# Patient Record
Sex: Male | Born: 1950 | Race: White | Hispanic: No | Marital: Single | Smoking: Never smoker
Health system: Southern US, Community
[De-identification: ages and names within clinical notes are randomized; demographics above are authoritative.]

## PROBLEM LIST (undated history)

## (undated) DIAGNOSIS — R569 Unspecified convulsions: Secondary | ICD-10-CM

## (undated) DIAGNOSIS — F79 Unspecified intellectual disabilities: Secondary | ICD-10-CM

## (undated) HISTORY — PX: COLON SURGERY: SHX602

---

## 2005-02-02 ENCOUNTER — Ambulatory Visit: Payer: Self-pay | Admitting: Internal Medicine

## 2011-02-08 ENCOUNTER — Ambulatory Visit: Payer: Self-pay | Admitting: Internal Medicine

## 2014-06-20 ENCOUNTER — Emergency Department: Payer: Self-pay | Admitting: Emergency Medicine

## 2014-10-08 ENCOUNTER — Ambulatory Visit: Payer: Self-pay | Admitting: Neurology

## 2017-01-11 ENCOUNTER — Emergency Department
Admission: EM | Admit: 2017-01-11 | Discharge: 2017-01-11 | Disposition: A | Discharge status: Home / Self Care | Payer: Medicare Other | Source: Home / Self Care | Attending: Emergency Medicine | Admitting: Emergency Medicine

## 2017-01-11 ENCOUNTER — Encounter: Payer: Self-pay | Admitting: Emergency Medicine

## 2017-01-11 DIAGNOSIS — N3000 Acute cystitis without hematuria: Secondary | ICD-10-CM | POA: Insufficient documentation

## 2017-01-11 DIAGNOSIS — N390 Urinary tract infection, site not specified: Secondary | ICD-10-CM | POA: Diagnosis not present

## 2017-01-11 LAB — CBC WITH DIFFERENTIAL/PLATELET
Basophils Absolute: 0 10*3/uL (ref 0–0.1)
Basophils Relative: 0 %
Eosinophils Absolute: 0 10*3/uL (ref 0–0.7)
Eosinophils Relative: 0 %
HEMATOCRIT: 38.4 % — AB (ref 40.0–52.0)
Hemoglobin: 13.1 g/dL (ref 13.0–18.0)
LYMPHS ABS: 0.6 10*3/uL — AB (ref 1.0–3.6)
LYMPHS PCT: 9 %
MCH: 30 pg (ref 26.0–34.0)
MCHC: 34.2 g/dL (ref 32.0–36.0)
MCV: 87.6 fL (ref 80.0–100.0)
MONO ABS: 0.9 10*3/uL (ref 0.2–1.0)
MONOS PCT: 13 %
NEUTROS ABS: 5.4 10*3/uL (ref 1.4–6.5)
Neutrophils Relative %: 78 %
Platelets: 145 10*3/uL — ABNORMAL LOW (ref 150–440)
RBC: 4.38 MIL/uL — ABNORMAL LOW (ref 4.40–5.90)
RDW: 13 % (ref 11.5–14.5)
WBC: 6.9 10*3/uL (ref 3.8–10.6)

## 2017-01-11 LAB — URINALYSIS, COMPLETE (UACMP) WITH MICROSCOPIC
Bilirubin Urine: NEGATIVE
GLUCOSE, UA: NEGATIVE mg/dL
Hgb urine dipstick: NEGATIVE
KETONES UR: 20 mg/dL — AB
NITRITE: NEGATIVE
PH: 5 (ref 5.0–8.0)
Protein, ur: 100 mg/dL — AB
SPECIFIC GRAVITY, URINE: 1.018 (ref 1.005–1.030)

## 2017-01-11 LAB — BASIC METABOLIC PANEL
Anion gap: 11 (ref 5–15)
BUN: 13 mg/dL (ref 6–20)
CALCIUM: 9 mg/dL (ref 8.9–10.3)
CHLORIDE: 97 mmol/L — AB (ref 101–111)
CO2: 27 mmol/L (ref 22–32)
CREATININE: 0.95 mg/dL (ref 0.61–1.24)
GFR calc Af Amer: >60 mL/min (ref >60)
GFR calc non Af Amer: >60 mL/min (ref >60)
GLUCOSE: 115 mg/dL — AB (ref 65–99)
Potassium: 3.6 mmol/L (ref 3.5–5.1)
Sodium: 135 mmol/L (ref 135–145)

## 2017-01-11 LAB — LACTIC ACID, PLASMA: Lactic Acid, Venous: 1.1 mmol/L (ref 0.5–1.9)

## 2017-01-11 MED ORDER — CEFTRIAXONE SODIUM-DEXTROSE 1-3.74 GM-% IV SOLR
1.0000 g | Freq: Once | INTRAVENOUS | Status: AC
  Administered 2017-01-11: 1 g via INTRAVENOUS
  Filled 2017-01-11: qty 50

## 2017-01-11 MED ORDER — CIPROFLOXACIN HCL 250 MG PO TABS: 250.0000 mg | ORAL_TABLET | Freq: Two times a day (BID) | ORAL | 0 refills | Status: DC

## 2017-01-11 MED ORDER — DEXTROSE 5 % IV SOLN: 1.0000 g | Freq: Once | INTRAVENOUS | Status: DC

## 2017-01-11 NOTE — ED Notes (Signed)
Patient care giver reports that patient has had fever and has had increased urinary frequency.

## 2017-01-11 NOTE — ED Notes (Signed)
Pt's caregiver verbalizes understanding of discharge instructions.

## 2017-01-11 NOTE — ED Triage Notes (Addendum)
Caregiver noticed odor to urine a  couple of days ago and today seems some altered. Pt with hx of UTI's.  Pt unable to communicate adequately.

## 2017-01-11 NOTE — ED Notes (Signed)
Pt unable to urinate at this time.  

## 2017-01-11 NOTE — ED Provider Notes (Signed)
[facility] Emergency Department Provider Note    L5 caveat: Review of systems and history is limited by mental retardation    Time seen: ----------------------------------------- 4:28 PM on 01/11/2017 -----------------------------------------    I have reviewed the triage vital signs and the nursing notes.   HISTORY  Chief Complaint Urinary Frequency    HPI Richard Gillespie is a 66 y.o. male who presents to the ER for possible altered mental status. He has a history of UTIs and his caregiver states his urine has a foul smell he's been somewhat off balance. Patient is difficult history communicating at baseline. She states he may have had a fever as well. No further information is available.   No past medical history on file.  There are no active problems to display for this patient.   No past surgical history on file.  Allergies Patient has no known allergies.  Social History Social History  Substance Use Topics  . Smoking status: Never Smoker  . Smokeless tobacco: Never Used  . Alcohol use No    Review of Systems Constitutional: Positive for fever ____________________________________________   PHYSICAL EXAM:  VITAL SIGNS: ED Triage Vitals [01/11/17 1406]  Enc Vitals Group     BP (!) 118/95     Pulse Rate 82     Resp 20     Temp 100 F (37.8 C)     Temp Source Oral     SpO2 93 %     Weight 150 lb (68 kg)     Height 5\' 5"  (1.651 m)     Head Circumference      Peak Flow      Pain Score      Pain Loc      Pain Edu?      Excl. in GC?     Constitutional: Alert. Well appearing and in no distress. Eyes: Conjunctivae are normal. Normal extraocular movements. ENT   Head: Normocephalic and atraumatic.   Nose: No congestion/rhinnorhea.   Mouth/Throat: Mucous membranes are moist.   Neck: No stridor. Cardiovascular: Normal rate, regular rhythm. No murmurs, rubs, or gallops. Respiratory: Normal respiratory effort without  tachypnea nor retractions. Breath sounds are clear and equal bilaterally. No wheezes/rales/rhonchi. Gastrointestinal: Soft and nontender. Normal bowel sounds Musculoskeletal: Nontender with normal range of motion in all extremities. No lower extremity tenderness nor edema. Neurologic:  No gross focal neurologic deficits are appreciated. Patient does require assistance with ambulation Skin:  Skin is warm, dry and intact. No rash noted. ____________________________________________  ED COURSE:  Pertinent labs & imaging results that were available during my care of the patient were reviewed by me and considered in my medical decision making (see chart for details). Patient presents to the ER in no acute distress. We will assess with labs and possible imaging.   Procedures ____________________________________________   LABS (pertinent positives/negatives)  Labs Reviewed  URINALYSIS, COMPLETE (UACMP) WITH MICROSCOPIC - Abnormal; Notable for the following:       Result Value   Color, Urine AMBER (*)    APPearance HAZY (*)    Ketones, ur 20 (*)    Protein, ur 100 (*)    Leukocytes, UA SMALL (*)    Bacteria, UA MANY (*)    Squamous Epithelial / LPF 0-5 (*)    All other components within normal limits  CBC WITH DIFFERENTIAL/PLATELET - Abnormal; Notable for the following:    RBC 4.38 (*)    HCT 38.4 (*)    Platelets 145 (*)  Lymphs Abs 0.6 (*)    All other components within normal limits  BASIC METABOLIC PANEL - Abnormal; Notable for the following:    Chloride 97 (*)    Glucose, Bld 115 (*)    All other components within normal limits  URINE CULTURE  LACTIC ACID, PLASMA  ____________________________________________  FINAL ASSESSMENT AND PLAN  Weakness, UTI  Plan: Patient with labs as dictated above. Patient does have a developing urinary tract infection. He was started on IV Rocephin. He'll be discharged with Cipro and a urine culture will be followed. He is stable for  outpatient follow-up.   [name], MD   Note: This note was generated in part or whole with voice recognition software. Voice recognition is usually quite accurate but there are transcription errors that can and very often do occur. I apologize for any typographical errors that were not detected and corrected.     [name], MD 01/11/17 [phone]

## 2017-01-12 ENCOUNTER — Emergency Department: Payer: Medicare Other

## 2017-01-12 ENCOUNTER — Inpatient Hospital Stay
Admission: EM | Admit: 2017-01-12 | Discharge: 2017-01-15 | DRG: 690 | Disposition: A | Discharge status: Skilled Nursing Facility | Payer: Medicare Other | Attending: Internal Medicine | Admitting: Internal Medicine

## 2017-01-12 ENCOUNTER — Encounter: Payer: Self-pay | Admitting: Emergency Medicine

## 2017-01-12 DIAGNOSIS — E876 Hypokalemia: Secondary | ICD-10-CM | POA: Diagnosis present

## 2017-01-12 DIAGNOSIS — I10 Essential (primary) hypertension: Secondary | ICD-10-CM | POA: Diagnosis present

## 2017-01-12 DIAGNOSIS — N390 Urinary tract infection, site not specified: Principal | ICD-10-CM | POA: Diagnosis present

## 2017-01-12 DIAGNOSIS — R739 Hyperglycemia, unspecified: Secondary | ICD-10-CM | POA: Diagnosis present

## 2017-01-12 DIAGNOSIS — G40909 Epilepsy, unspecified, not intractable, without status epilepticus: Secondary | ICD-10-CM | POA: Diagnosis present

## 2017-01-12 DIAGNOSIS — R05 Cough: Secondary | ICD-10-CM

## 2017-01-12 DIAGNOSIS — R059 Cough, unspecified: Secondary | ICD-10-CM

## 2017-01-12 DIAGNOSIS — B9620 Unspecified Escherichia coli [E. coli] as the cause of diseases classified elsewhere: Secondary | ICD-10-CM | POA: Diagnosis present

## 2017-01-12 DIAGNOSIS — A498 Other bacterial infections of unspecified site: Secondary | ICD-10-CM

## 2017-01-12 DIAGNOSIS — R32 Unspecified urinary incontinence: Secondary | ICD-10-CM | POA: Diagnosis present

## 2017-01-12 DIAGNOSIS — Z79899 Other long term (current) drug therapy: Secondary | ICD-10-CM

## 2017-01-12 DIAGNOSIS — E860 Dehydration: Secondary | ICD-10-CM | POA: Diagnosis present

## 2017-01-12 DIAGNOSIS — R197 Diarrhea, unspecified: Secondary | ICD-10-CM | POA: Diagnosis present

## 2017-01-12 DIAGNOSIS — F79 Unspecified intellectual disabilities: Secondary | ICD-10-CM | POA: Diagnosis present

## 2017-01-12 DIAGNOSIS — R296 Repeated falls: Secondary | ICD-10-CM | POA: Diagnosis present

## 2017-01-12 DIAGNOSIS — E871 Hypo-osmolality and hyponatremia: Secondary | ICD-10-CM | POA: Diagnosis present

## 2017-01-12 DIAGNOSIS — R451 Restlessness and agitation: Secondary | ICD-10-CM

## 2017-01-12 DIAGNOSIS — N12 Tubulo-interstitial nephritis, not specified as acute or chronic: Secondary | ICD-10-CM

## 2017-01-12 DIAGNOSIS — D72829 Elevated white blood cell count, unspecified: Secondary | ICD-10-CM

## 2017-01-12 HISTORY — DX: Unspecified convulsions: R56.9

## 2017-01-12 HISTORY — DX: Unspecified intellectual disabilities: F79

## 2017-01-12 LAB — CBC WITH DIFFERENTIAL/PLATELET
BASOS PCT: 0 %
Basophils Absolute: 0 10*3/uL (ref 0–0.1)
EOS ABS: 0 10*3/uL (ref 0–0.7)
EOS PCT: 0 %
HCT: 39.4 % — ABNORMAL LOW (ref 40.0–52.0)
Hemoglobin: 14 g/dL (ref 13.0–18.0)
LYMPHS ABS: 1 10*3/uL (ref 1.0–3.6)
Lymphocytes Relative: 9 %
MCH: 30.3 pg (ref 26.0–34.0)
MCHC: 35.6 g/dL (ref 32.0–36.0)
MCV: 85.2 fL (ref 80.0–100.0)
Monocytes Absolute: 1.1 10*3/uL — ABNORMAL HIGH (ref 0.2–1.0)
Monocytes Relative: 10 %
Neutro Abs: 9.5 10*3/uL — ABNORMAL HIGH (ref 1.4–6.5)
Neutrophils Relative %: 81 %
PLATELETS: 139 10*3/uL — AB (ref 150–440)
RBC: 4.63 MIL/uL (ref 4.40–5.90)
RDW: 13.1 % (ref 11.5–14.5)
WBC: 11.7 10*3/uL — AB (ref 3.8–10.6)

## 2017-01-12 LAB — COMPREHENSIVE METABOLIC PANEL
ALBUMIN: 4.4 g/dL (ref 3.5–5.0)
ALT: 29 U/L (ref 17–63)
AST: 48 U/L — ABNORMAL HIGH (ref 15–41)
Alkaline Phosphatase: 49 U/L (ref 38–126)
Anion gap: 12 (ref 5–15)
BUN: 23 mg/dL — AB (ref 6–20)
CALCIUM: 8.4 mg/dL — AB (ref 8.9–10.3)
CO2: 21 mmol/L — ABNORMAL LOW (ref 22–32)
CREATININE: 1.23 mg/dL (ref 0.61–1.24)
Chloride: 100 mmol/L — ABNORMAL LOW (ref 101–111)
GFR calc Af Amer: >60 mL/min (ref >60)
GFR calc non Af Amer: 60 mL/min — ABNORMAL LOW (ref >60)
GLUCOSE: 127 mg/dL — AB (ref 65–99)
POTASSIUM: 3.1 mmol/L — AB (ref 3.5–5.1)
SODIUM: 133 mmol/L — AB (ref 135–145)
TOTAL PROTEIN: 7.8 g/dL (ref 6.5–8.1)
Total Bilirubin: 0.8 mg/dL (ref 0.3–1.2)

## 2017-01-12 LAB — LACTIC ACID, PLASMA: LACTIC ACID, VENOUS: 0.8 mmol/L (ref 0.5–1.9)

## 2017-01-12 LAB — MRSA PCR SCREENING: MRSA BY PCR: NEGATIVE

## 2017-01-12 MED ORDER — ONDANSETRON HCL 4 MG PO TABS: 4.0000 mg | ORAL_TABLET | Freq: Four times a day (QID) | ORAL | Status: DC | PRN

## 2017-01-12 MED ORDER — CARBAMAZEPINE ER 100 MG PO TB12
100.0000 mg | ORAL_TABLET | Freq: Every day | ORAL | Status: DC
  Administered 2017-01-14: 100 mg via ORAL
  Filled 2017-01-12 (×3): qty 1

## 2017-01-12 MED ORDER — ACETAMINOPHEN 650 MG RE SUPP: 650.0000 mg | Freq: Four times a day (QID) | RECTAL | Status: DC | PRN

## 2017-01-12 MED ORDER — TRAZODONE HCL 100 MG PO TABS
150.0000 mg | ORAL_TABLET | Freq: Every evening | ORAL | Status: DC | PRN
  Administered 2017-01-13: 21:00:00 150 mg via ORAL
  Filled 2017-01-12: qty 1

## 2017-01-12 MED ORDER — CEFTRIAXONE SODIUM-DEXTROSE 1-3.74 GM-% IV SOLR
1.0000 g | Freq: Once | INTRAVENOUS | Status: AC
  Administered 2017-01-12: 1 g via INTRAVENOUS
  Filled 2017-01-12: qty 50

## 2017-01-12 MED ORDER — ONDANSETRON HCL 4 MG/2ML IJ SOLN: 4.0000 mg | Freq: Four times a day (QID) | INTRAMUSCULAR | Status: DC | PRN

## 2017-01-12 MED ORDER — CARBAMAZEPINE ER 200 MG PO TB12
200.0000 mg | ORAL_TABLET | Freq: Four times a day (QID) | ORAL | Status: DC
  Administered 2017-01-12 – 2017-01-15 (×9): 200 mg via ORAL
  Filled 2017-01-12 (×9): qty 1

## 2017-01-12 MED ORDER — CARBAMAZEPINE ER 100 MG PO TB12: 100.0000 mg | ORAL_TABLET | Freq: Four times a day (QID) | ORAL | Status: DC

## 2017-01-12 MED ORDER — LAMOTRIGINE 25 MG PO TABS
50.0000 mg | ORAL_TABLET | Freq: Two times a day (BID) | ORAL | Status: DC
  Administered 2017-01-13 – 2017-01-15 (×5): 50 mg via ORAL
  Filled 2017-01-12 (×5): qty 2

## 2017-01-12 MED ORDER — ACETAMINOPHEN 325 MG PO TABS: 650.0000 mg | ORAL_TABLET | Freq: Four times a day (QID) | ORAL | Status: DC | PRN

## 2017-01-12 MED ORDER — DEXTROSE 5 % IV SOLN
1.0000 g | INTRAVENOUS | Status: DC
  Administered 2017-01-13: 1 g via INTRAVENOUS
  Filled 2017-01-12 (×2): qty 10

## 2017-01-12 MED ORDER — TRAZODONE HCL 50 MG PO TABS: 25.0000 mg | ORAL_TABLET | Freq: Every evening | ORAL | Status: DC | PRN

## 2017-01-12 MED ORDER — SODIUM CHLORIDE 0.9 % IV SOLN
INTRAVENOUS | Status: DC
  Administered 2017-01-12: 19:00:00 via INTRAVENOUS

## 2017-01-12 MED ORDER — SODIUM CHLORIDE 0.9 % IV BOLUS (SEPSIS)
500.0000 mL | Freq: Once | INTRAVENOUS | Status: AC
  Administered 2017-01-12: 500 mL via INTRAVENOUS

## 2017-01-12 MED ORDER — HEPARIN SODIUM (PORCINE) 5000 UNIT/ML IJ SOLN
5000.0000 [IU] | Freq: Three times a day (TID) | INTRAMUSCULAR | Status: DC
  Administered 2017-01-12 – 2017-01-15 (×6): 5000 [IU] via SUBCUTANEOUS
  Filled 2017-01-12 (×7): qty 1

## 2017-01-12 MED ORDER — DEXTROSE 5 % IV SOLN: 1.0000 g | Freq: Once | INTRAVENOUS | Status: DC

## 2017-01-12 MED ORDER — CARBAMAZEPINE 200 MG PO TABS
ORAL_TABLET | ORAL | Status: AC
  Filled 2017-01-12: qty 1

## 2017-01-12 MED ORDER — SODIUM CHLORIDE 0.9 % IV SOLN
30.0000 meq | Freq: Once | INTRAVENOUS | Status: AC
  Administered 2017-01-12: 30 meq via INTRAVENOUS
  Filled 2017-01-12: qty 15

## 2017-01-12 NOTE — ED Triage Notes (Signed)
Caregivers state patient has fallen 3 times in past 24 hours. States was [facility] for [facility] and started on antibiotics.

## 2017-01-12 NOTE — ED Provider Notes (Signed)
[facility] Emergency Department Provider Note  ____________________________________________  Time seen: Approximately 3:37 PM  I have reviewed the triage vital signs and the nursing notes.   HISTORY  Chief Complaint Fall and Urinary Tract Infection  Level 5 caveat:  Portions of the history and physical were unable to be obtained due to mental retardation   HPI Richard Gillespie is a 66 y.o. male with h/o MR and seizure who presents for evaluation of generalized weakness and multiple falls. Patient was seen here yesterday and diagnosed with a urinary tract infection. He received a dose of ceftriaxone and was sent home on Cipro which she has been taking. He lives in a group home and his caretakers are here with him. The family patient has been extremely weak and has had multiple falls since leaving the hospital. He is now incontinent of urine and stool which is new for him. Has had diarrhea 3 or 4 episodes since leaving the hospital as well. Has had a low-grade fever of 99 at home. Continues to have poor appetite. As far as they know patient did not sustain any injuries from his falls.  Past Medical History:  Diagnosis Date  . Mental retardation   . Seizures (HCC)     There are no active problems to display for this patient.   Past Surgical History:  Procedure Laterality Date  . COLON SURGERY      Prior to Admission medications   Medication Sig Start Date End Date Taking? Authorizing Provider  ciprofloxacin (CIPRO) 250 MG tablet Take 1 tablet (250 mg total) by mouth 2 (two) times daily. 01/11/17 01/21/17  [name], MD    Allergies Zithromax [azithromycin]  No family history on file.  Social History Social History  Substance Use Topics  . Smoking status: Never Smoker  . Smokeless tobacco: Never Used  . Alcohol use No    Review of Systems  Constitutional: Negative for fever. + generalized weakness and multiple falls Eyes: Negative  for visual changes. ENT: Negative for sore throat. Neck: No neck pain  Cardiovascular: Negative for chest pain. Respiratory: Negative for shortness of breath. Gastrointestinal: Negative for abdominal pain, vomiting. + diarrhea. Genitourinary: Negative for dysuria. + incontinence Musculoskeletal: Negative for back pain. Skin: Negative for rash. Neurological: Negative for headaches, weakness or numbness. Psych: No SI or HI  ____________________________________________   PHYSICAL EXAM:  VITAL SIGNS: ED Triage Vitals  Enc Vitals Group     BP 01/12/17 1420 115/76     Pulse Rate 01/12/17 1420 83     Resp 01/12/17 1420 20     Temp 01/12/17 1420 99.6 F (37.6 C)     Temp Source 01/12/17 1420 Oral     SpO2 01/12/17 1420 96 %     Weight 01/12/17 1421 150 lb (68 kg)     Height 01/12/17 1421 5\' 5"  (1.651 m)     Head Circumference --      Peak Flow --      Pain Score --      Pain Loc --      Pain Edu? --      Excl. in GC? --     Constitutional: Awake, answers no to all questions. No distress. HEENT Head: Normocephalic and atraumatic. Face: No facial bony tenderness. Stable midface Ears: No hemotympanum bilaterally. No Battle sign Eyes: No eye injury. PERRL. No raccoon eyes Nose: Nontender. No epistaxis. No rhinorrhea Mouth/Throat: Mucous membranes are moist. No oropharyngeal blood. No dental injury.  Airway patent without stridor. Normal voice. Neck: no C-collar in place. No midline c-spine tenderness.  Cardiovascular: Normal rate, regular rhythm. Normal and symmetric distal pulses are present in all extremities. Pulmonary/Chest: Chest wall is stable and nontender to palpation/compression. Normal respiratory effort. Breath sounds are normal. No crepitus.  Abdominal: Soft, nontender, non distended. Musculoskeletal: Nontender with normal full range of motion in all extremities. No deformities. No thoracic or lumbar midline spinal tenderness. Pelvis is stable. Skin: Abrasions to the  upper back and left shoulder Neurological: Face is symmetric. Moves all extremities to command. No gross focal neurologic deficits are appreciated.  Glascow Coma Score: 4 - Opens eyes on own 6 - Follows simple motor commands 4 - Seems confused, disoriented GCS: 14  ____________________________________________   LABS (all labs ordered are listed, but only abnormal results are displayed)  Labs Reviewed  CBC WITH DIFFERENTIAL/PLATELET - Abnormal; Notable for the following:       Result Value   WBC 11.7 (*)    HCT 39.4 (*)    Platelets 139 (*)    Neutro Abs 9.5 (*)    Monocytes Absolute 1.1 (*)    All other components within normal limits  COMPREHENSIVE METABOLIC PANEL - Abnormal; Notable for the following:    Sodium 133 (*)    Potassium 3.1 (*)    Chloride 100 (*)    CO2 21 (*)    Glucose, Bld 127 (*)    BUN 23 (*)    Calcium 8.4 (*)    AST 48 (*)    GFR calc non Af Amer 60 (*)    All other components within normal limits  URINALYSIS, ROUTINE W REFLEX MICROSCOPIC  LACTIC ACID, PLASMA   ____________________________________________  EKG  none ____________________________________________  RADIOLOGY  Reviewed by me with no acute findings ____________________________________________   PROCEDURES  Procedure(s) performed: None Procedures Critical Care performed:  None ____________________________________________   INITIAL IMPRESSION / ASSESSMENT AND PLAN / ED COURSE  66 y.o. male with h/o MR and seizure who presents for evaluation of generalized weakness, multiple falls, urinary incontinence, and diarrhea since being diagnosed with UTI yesterday. PE showing abrasions to upper back and L shoulder. No other findings. Labs showing worsening leukocytosis 6.9 -> 11.7 since yesterday. Vitals showing temp 99.77F, otherwise normal. XR of the L shoulder, head CT and CT c-spine negative. Urine culture from yesterday showing > 100K colonies of E. Coli. Susceptibility pending.  Since patient now has low grade temp, worsening leukocytosis, and generalized weakness will admit for IV abx.   Clinical Course as of Jan 12 1645  Sat Jan 12, 2017  1645 Patient's temp 96F, worsening leukocytosis, incontinent and worsening clinical picture concerning for failed outpatient therapy. Will give ceftriaxone and admit to Hospitalist for pyelonephritis  [CV]    Clinical Course User Index [CV] Nita Sicklearolina Syna Gad, MD    Pertinent labs & imaging results that were available during my care of the patient were reviewed by me and considered in my medical decision making (see chart for details).    ____________________________________________   FINAL CLINICAL IMPRESSION(S) / ED DIAGNOSES  Final diagnoses:  Pyelonephritis      NEW MEDICATIONS STARTED DURING THIS VISIT:  New Prescriptions   No medications on file     Note:  This document was prepared using Dragon voice recognition software and may include unintentional dictation errors.    Nita Sicklearolina Lorilei Horan, MD 01/12/17 [phone]

## 2017-01-12 NOTE — ED Notes (Signed)
Bed placement called and stated that the floor nurses wanted the patient placed close to the nurses station. The available rooms are being cleaned at this time.

## 2017-01-12 NOTE — ED Notes (Signed)
Pharmacy called for admission meds.

## 2017-01-12 NOTE — ED Notes (Signed)
FIRST NURSE NOTE: Pt lives in a group home, increased falls and has been incontinent. Group home representatives with patient at this time.

## 2017-01-12 NOTE — ED Notes (Signed)
Patient has gone to CT scan.

## 2017-01-12 NOTE — H&P (Signed)
[facility] [place] [place] [facility]   PATIENT NAME: Richard Gillespie    MR#:  [id number]  DATE OF BIRTH:  Jun 29, 1951  DATE OF ADMISSION:  01/12/2017  PRIMARY CARE PHYSICIAN: Rafael Bihari, MD REQUESTING/REFERRING PHYSICIAN:   CHIEF COMPLAINT:   Chief Complaint  Patient presents with  . Fall  . Urinary Tract Infection    HISTORY OF PRESENT ILLNESS:  Richard Gillespie  is a 66 y.o. male with a known history of mental retardation,seizure disorder comes from [facility] group home because of multiple episodes of falls today, incontinence, diarrhea. Patient was in the emergency room yesterday and was treated for UTI with Cipro  discharged backto group home. But the staff noticed that he had fallen  multiple times today, confused, has diarrhea, incontinence, low-grade temp of 99 Fahrenheit.  Urine Cultures from 2/16  showed Escherichia coli.  PAST MEDICAL HISTORY:   Past Medical History:  Diagnosis Date  . Mental retardation   . Seizures (HCC)     PAST SURGICAL HISTOIRY:   Past Surgical History:  Procedure Laterality Date  . COLON SURGERY      SOCIAL HISTORY:   Social History  Substance Use Topics  . Smoking status: Never Smoker  . Smokeless tobacco: Never Used  . Alcohol use No    FAMILY HISTORY:  No family history on file.  DRUG ALLERGIES:   Allergies  Allergen Reactions  . Zithromax [Azithromycin] Other (See Comments)    unknown    REVIEW OF SYSTEMS:  Not obtainable because of his mental retardation. MEDICATIONS AT HOME:   Prior to Admission medications   Medication Sig Start Date End Date Taking? Authorizing Provider  ciprofloxacin (CIPRO) 250 MG tablet Take 1 tablet (250 mg total) by mouth 2 (two) times daily. 01/11/17 01/21/17  [name], MD      VITAL SIGNS:  Blood pressure 109/60, pulse 69, temperature 99.6 F (37.6 C), temperature source Oral, resp. rate 18, height 5\' 5"  (1.651 m), weight 68 kg (150 lb), SpO2 92 %.  PHYSICAL  EXAMINATION:  GENERAL:  66 y.o.-year-old patient lying in the bed with no acute distress.  EYES: Pupils equal, round, reactive to light and accommodation. No scleral icterus. Extraocular muscles intact.  HEENT: Head atraumatic, normocephalic. Oropharynx and nasopharynx clear.  NECK:  Supple, no jugular venous distention. No thyroid enlargement, no tenderness.  LUNGS: Normal breath sounds bilaterally, no wheezing, rales,rhonchi or crepitation. No use of accessory muscles of respiration.  CARDIOVASCULAR: S1, S2 normal. No murmurs, rubs, or gallops.  ABDOMEN: Soft, nontender, nondistended. Bowel sounds present. No organomegaly or mass.  EXTREMITIES: No pedal edema, cyanosis, or clubbing.  NEUROLOGIC:Unable to do neurological exam because of mental retardation  PSYCHIATRIC: The patient is alert and oriented x 3. He knows  is in the hospital. SKIN: No obvious rash, lesion, or ulcer.   LABORATORY PANEL:   CBC  Recent Labs Lab 01/12/17 1436  WBC 11.7*  HGB 14.0  HCT 39.4*  PLT 139*   ------------------------------------------------------------------------------------------------------------------  Chemistries   Recent Labs Lab 01/12/17 1436  NA 133*  K 3.1*  CL 100*  CO2 21*  GLUCOSE 127*  BUN 23*  CREATININE 1.23  CALCIUM 8.4*  AST 48*  ALT 29  ALKPHOS 49  BILITOT 0.8   ------------------------------------------------------------------------------------------------------------------  Cardiac Enzymes No results for input(s): TROPONINI in the last 168 hours. ------------------------------------------------------------------------------------------------------------------  RADIOLOGY:  Ct Head Wo Contrast  Result Date: 01/12/2017 CLINICAL DATA:  Larey Seat today, unable to stand unknown, history  of seizures, mental retardation EXAM: CT HEAD WITHOUT CONTRAST CT CERVICAL SPINE WITHOUT CONTRAST TECHNIQUE: Multidetector CT imaging of the head and cervical spine was performed  following the standard protocol without intravenous contrast. Multiplanar CT image reconstructions of the cervical spine were also generated. COMPARISON:  10/08/2014 FINDINGS: CT HEAD FINDINGS Brain: Nonstandard positioning due to patient condition. Scattered motion artifacts. Generalized atrophy. Stable ventricular morphology. No midline shift. Low-attenuation lesion with partially calcified rim at the medial aspect of the RIGHT middle cranial fossa, 4.4 x 2.3 cm series 14, image 52, extending into posterior fossa, demonstrating slightly more calcification than on the previous exam. No additional intracranial mass lesion. No evidence of acute infarction or intracranial hemorrhage. No extra-axial fluid collections. Vascular: Unremarkable Skull: Intact Sinuses/Orbits: Clear Other: N/A CT CERVICAL SPINE FINDINGS Alignment: Normal Skull base and vertebrae: Visualized skullbase intact. Incomplete posterior arch C1, developmental variant. Vertebral body heights maintained without fracture or bone destruction. Soft tissues and spinal canal: Prevertebral soft tissues normal thickness. Disc levels:  Disc space heights fairly well maintained Upper chest: Visualized lung apices grossly clear Other: N/A IMPRESSION: No acute cervical spine abnormalities. Generalized atrophy. 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 No new intracranial abnormalities. Electronically Signed   By: Ulyses SouthwardMark  Boles M.D.   On: 01/12/2017 16:22   Ct Cervical Spine Wo Contrast  Result Date: 01/12/2017 CLINICAL DATA:  Larey SeatFell today, unable to stand unknown, history of seizures, mental retardation EXAM: CT HEAD WITHOUT CONTRAST CT CERVICAL SPINE WITHOUT CONTRAST TECHNIQUE: Multidetector CT imaging  of the head and cervical spine was performed following the standard protocol without intravenous contrast. Multiplanar CT image reconstructions of the cervical spine were also generated. COMPARISON:  10/08/2014 FINDINGS: CT HEAD FINDINGS Brain: Nonstandard positioning due to patient condition. Scattered motion artifacts. Generalized atrophy. Stable ventricular morphology. No midline shift. Low-attenuation lesion with partially calcified rim at the medial aspect of the RIGHT middle cranial fossa, 4.4 x 2.3 cm series 14, image 52, extending into posterior fossa, demonstrating slightly more calcification than on the previous exam. No additional intracranial mass lesion. No evidence of acute infarction or intracranial hemorrhage. No extra-axial fluid collections. Vascular: Unremarkable Skull: Intact Sinuses/Orbits: Clear Other: N/A CT CERVICAL SPINE FINDINGS Alignment: Normal Skull base and vertebrae: Visualized skullbase intact. Incomplete posterior arch C1, developmental variant. Vertebral body heights maintained without fracture or bone destruction. Soft tissues and spinal canal: Prevertebral soft tissues normal thickness. Disc levels:  Disc space heights fairly well maintained Upper chest: Visualized lung apices grossly clear Other: N/A IMPRESSION: No acute cervical spine abnormalities. Generalized atrophy. 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 No new intracranial abnormalities. Electronically Signed   By: Ulyses SouthwardMark  Boles M.D.   On: 01/12/2017 16:22   Dg Shoulder Left  Result Date: 01/12/2017 CLINICAL DATA:  Pain following fall EXAM: LEFT SHOULDER - 2+ VIEW COMPARISON:  None. FINDINGS: Internal rotation, external rotation, and Y scapular images were  obtained. There is no evident fracture or dislocation. Joint spaces appear unremarkable. No erosive change. Visualized left lung clear. IMPRESSION: No fracture  or dislocation.  No appreciable arthropathy. Electronically Signed   By: Bretta Bang III M.D.   On: 01/12/2017 16:14    EKG:  No orders found for this or any previous visit.  IMPRESSION AND PLAN:  #66 year old male with mental retardation, seizure disorder brought from group home because of multiple episodes of falls, diarrhea, incontinence, low-grade temperature today. Patient failed outpatient therapy for UTI . Seen yesterday in the emergency room, was given Cipro but he continued to have low-grade temperature, the elevated white count today, hypokalemia.  #1 Escherichia coli UTI, failed outpatient therapy with ongoing temperature, leukocytosis: Admitted to hospitalist service, started on IV Rocephin, follow sensitivity results for urine cultures.  #2 seizure disorder: Continue Tegretol, Lamictal. Patient is on Tegretol-XR 200 mg 4 times daily, patient takes an additional 100 mg at night. And patient also  is on Lamictal 50 mg twice a day. Continue them. Last seizure was years ago as per  Group  home staff. 3.Hypokalemia;: Secondary to diarrhea, GI losses: Replace the potassium 4. Hypertension: Patient   bplow normal today hold the clonidine.  #5,. mental retardation.lives at [facility] group home for years.on trazodone at night .    All the records are reviewed and case discussed with ED provider. Management plans discussed with the patient, family and they are in agreement.  CODE STATUS:full  TOTAL TIME TAKING CARE OF THIS PATIENT: 55 minutes.    Katha Hamming M.D on 01/12/2017 at 5:04 PM  Between 7am to 6pm - Pager - [phone]  After 6pm go to [email or web address] - password [email or web address]  [facility]  Office  [phone]  CC: Primary care physician; Rafael Bihari, MD  Note: This  dictation was prepared with Dragon dictation along with smaller phrase technology. Any transcriptional errors that result from this process are unintentional.

## 2017-01-12 NOTE — ED Triage Notes (Signed)
[facility] from [facility] with caregiver.

## 2017-01-12 NOTE — Progress Notes (Signed)
Family Meeting Note  Advance Directive:no  Today a meeting took place with the caregivers from group home. Discussed The code  status with caregivers from group home, patient is a full code caregivers from group home  He  is unable to participate due ZO:[id number]:Lacked capacity mental retardation   The following clinical team members were present during this meeting:MD  The following were discussed:Patient's diagnosis: , Patient's progosis: Unable to determine and Goals for treatment: Full Code  Additional follow-up to be provided: discuss with POA  Time spent during discussion:15 minutes  Katha HammingKONIDENA,Nyanna Heideman, MD

## 2017-01-12 NOTE — Progress Notes (Signed)
Patient is refusing oral meds °

## 2017-01-13 ENCOUNTER — Inpatient Hospital Stay: Payer: Medicare Other

## 2017-01-13 LAB — BASIC METABOLIC PANEL
Anion gap: 9 (ref 5–15)
BUN: 18 mg/dL (ref 6–20)
CO2: 22 mmol/L (ref 22–32)
Calcium: 8.2 mg/dL — ABNORMAL LOW (ref 8.9–10.3)
Chloride: 107 mmol/L (ref 101–111)
Creatinine, Ser: 0.76 mg/dL (ref 0.61–1.24)
GFR calc Af Amer: >60 mL/min (ref >60)
GLUCOSE: 104 mg/dL — AB (ref 65–99)
POTASSIUM: 3.2 mmol/L — AB (ref 3.5–5.1)
Sodium: 138 mmol/L (ref 135–145)

## 2017-01-13 LAB — CBC
HEMATOCRIT: 39.2 % — AB (ref 40.0–52.0)
Hemoglobin: 13.5 g/dL (ref 13.0–18.0)
MCH: 29.7 pg (ref 26.0–34.0)
MCHC: 34.4 g/dL (ref 32.0–36.0)
MCV: 86.2 fL (ref 80.0–100.0)
Platelets: 122 10*3/uL — ABNORMAL LOW (ref 150–440)
RBC: 4.55 MIL/uL (ref 4.40–5.90)
RDW: 13.4 % (ref 11.5–14.5)
WBC: 8.3 10*3/uL (ref 3.8–10.6)

## 2017-01-13 LAB — URINE CULTURE: SPECIAL REQUESTS: NORMAL

## 2017-01-13 LAB — GLUCOSE, CAPILLARY: Glucose-Capillary: 95 mg/dL (ref 65–99)

## 2017-01-13 LAB — MAGNESIUM: MAGNESIUM: 2 mg/dL (ref 1.7–2.4)

## 2017-01-13 MED ORDER — POTASSIUM CHLORIDE IN NACL 20-0.9 MEQ/L-% IV SOLN
INTRAVENOUS | Status: DC
  Filled 2017-01-13 (×5): qty 1000

## 2017-01-13 NOTE — Progress Notes (Signed)
Bladder scan showed 139 mL. Pt resting in bed. No distress/discomfort noted at this time.

## 2017-01-13 NOTE — Plan of Care (Signed)
Problem: Education: Goal: Knowledge of [place] [facility] Education information/materials will improve Outcome: Not Progressing Patient has MR and doesn show signs of retaining education

## 2017-01-13 NOTE — Progress Notes (Signed)
[facility] - [place] at [facility]   PATIENT NAME: Richard Gillespie    MR#:  [id number]  DATE OF BIRTH:  [birth year]  SUBJECTIVE:  CHIEF COMPLAINT:   Chief Complaint  Patient presents with  . Fall  . Urinary Tract Infection   The patient is 66 year old Caucasian male with past medical history significant for history of seizure disorder, who presents to the hospital from the group home because of multiple episodes of fall, incontinence, diarrhea. Apparently, the patient was admitted to the emergency room a few days ago, diagnosed with UTI, discharged on ciprofloxacin back to group home. He was however noted to have confusion, diarrhea, incontinence, low-grade fever and was brought back. His urine cultures are growing Escherichia coli. Patient was initiated on Rocephin intravenously. Continues to have watery diarrhea, is not able to provide any review of systems due to mental retardation  Review of Systems  Unable to perform ROS: Dementia    VITAL SIGNS: Blood pressure (!) 128/59, pulse 67, temperature 97.7 F (36.5 C), temperature source Oral, resp. rate 18, height 5\' 5"  (1.651 m), weight 64.8 kg (142 lb 13.7 oz), SpO2 93 %.  PHYSICAL EXAMINATION:   GENERAL:  66 y.o.-year-old patient lying in the bed in mild distress, restless, uncomfortable, is sitting in the pool of watery stool.  EYES: Pupils equal, round, reactive to light and accommodation. No scleral icterus. Extraocular muscles intact.  HEENT: Head atraumatic, normocephalic. Oropharynx and nasopharynx clear.  NECK:  Supple, no jugular venous distention. No thyroid enlargement, no tenderness.  LUNGS: Normal breath sounds bilaterally, no wheezing, rales,rhonchi or crepitation. No use of accessory muscles of respiration.  CARDIOVASCULAR: S1, S2 normal. No murmurs, rubs, or gallops.  ABDOMEN: Soft, nontender, mild discomfort on palpation, patient pushes examiners away, no rebound or guarding, nondistended. Bowel  sounds present. No organomegaly or mass.  EXTREMITIES: No pedal edema, cyanosis, or clubbing.  NEUROLOGIC: Cranial nerves II through XII are intact. Muscle strength 5/5 in all extremities. Sensation intact. Gait not checked.  PSYCHIATRIC: The patient is alert , not oriented, minimally verbal, able to answer yes and no, however, no meaningful conversation SKIN: No obvious rash, lesion, or ulcer.   ORDERS/RESULTS REVIEWED:   CBC  Recent Labs Lab 01/11/17 1710 01/12/17 1436 01/13/17 0342  WBC 6.9 11.7* 8.3  HGB 13.1 14.0 13.5  HCT 38.4* 39.4* 39.2*  PLT 145* 139* 122*  MCV 87.6 85.2 86.2  MCH 30.0 30.3 29.7  MCHC 34.2 35.6 34.4  RDW 13.0 13.1 13.4  LYMPHSABS 0.6* 1.0  --   MONOABS 0.9 1.1*  --   EOSABS 0.0 0.0  --   BASOSABS 0.0 0.0  --    ------------------------------------------------------------------------------------------------------------------  Chemistries   Recent Labs Lab 01/11/17 1710 01/12/17 1436 01/13/17 0342  NA 135 133* 138  K 3.6 3.1* 3.2*  CL 97* 100* 107  CO2 27 21* 22  GLUCOSE 115* 127* 104*  BUN 13 23* 18  CREATININE 0.95 1.23 0.76  CALCIUM 9.0 8.4* 8.2*  AST  --  48*  --   ALT  --  29  --   ALKPHOS  --  49  --   BILITOT  --  0.8  --    ------------------------------------------------------------------------------------------------------------------ estimated creatinine clearance is 80.1 mL/min (by C-G formula based on SCr of 0.76 mg/dL). ------------------------------------------------------------------------------------------------------------------ No results for input(s): TSH, T4TOTAL, T3FREE, THYROIDAB in the last 72 hours.  Invalid input(s): FREET3  Cardiac Enzymes No results for input(s): CKMB, TROPONINI, MYOGLOBIN in the last 168  hours.  Invalid input(s): CK ------------------------------------------------------------------------------------------------------------------ Invalid input(s):  POCBNP ---------------------------------------------------------------------------------------------------------------  RADIOLOGY: Ct Head Wo Contrast  Result Date: 01/12/2017 CLINICAL DATA:  Larey Seat today, unable to stand unknown, history of seizures, mental retardation EXAM: CT HEAD WITHOUT CONTRAST CT CERVICAL SPINE WITHOUT CONTRAST TECHNIQUE: Multidetector CT imaging of the head and cervical spine was performed following the standard protocol without intravenous contrast. Multiplanar CT image reconstructions of the cervical spine were also generated. COMPARISON:  10/08/2014 FINDINGS: CT HEAD FINDINGS Brain: Nonstandard positioning due to patient condition. Scattered motion artifacts. Generalized atrophy. Stable ventricular morphology. No midline shift. Low-attenuation lesion with partially calcified rim at the medial aspect of the RIGHT middle cranial fossa, 4.4 x 2.3 cm series 14, image 52, extending into posterior fossa, demonstrating slightly more calcification than on the previous exam. No additional intracranial mass lesion. No evidence of acute infarction or intracranial hemorrhage. No extra-axial fluid collections. Vascular: Unremarkable Skull: Intact Sinuses/Orbits: Clear Other: N/A CT CERVICAL SPINE FINDINGS Alignment: Normal Skull base and vertebrae: Visualized skullbase intact. Incomplete posterior arch C1, developmental variant. Vertebral body heights maintained without fracture or bone destruction. Soft tissues and spinal canal: Prevertebral soft tissues normal thickness. Disc levels:  Disc space heights fairly well maintained Upper chest: Visualized lung apices grossly clear Other: N/A IMPRESSION: No acute cervical spine abnormalities. Generalized atrophy. Again identified low-attenuation mass with peripheral calcification at the medial aspect of the RIGHT middle cranial fossa extending into posterior fossa adjacent to the RIGHT lateral aspect of the brainstem, 4.4 x 2.3 cm; based on very low  attenuation, this could represent a dermoid or epidermoid tumor. No new intracranial abnormalities. Electronically Signed   By: Ulyses Southward M.D.   On: 01/12/2017 16:22   Ct Cervical Spine Wo Contrast  Result Date: 01/12/2017 CLINICAL DATA:  Larey Seat today, unable to stand unknown, history of seizures, mental retardation EXAM: CT HEAD WITHOUT CONTRAST CT CERVICAL SPINE WITHOUT CONTRAST TECHNIQUE: Multidetector CT imaging of the head and cervical spine was performed following the standard protocol without intravenous contrast. Multiplanar CT image reconstructions of the cervical spine were also generated. COMPARISON:  10/08/2014 FINDINGS: CT HEAD FINDINGS Brain: Nonstandard positioning due to patient condition. Scattered motion artifacts. Generalized atrophy. Stable ventricular morphology. No midline shift. Low-attenuation lesion with partially calcified rim at the medial aspect of the RIGHT middle cranial fossa, 4.4 x 2.3 cm series 14, image 52, extending into posterior fossa, demonstrating slightly more calcification than on the previous exam. No additional intracranial mass lesion. No evidence of acute infarction or intracranial hemorrhage. No extra-axial fluid collections. Vascular: Unremarkable Skull: Intact Sinuses/Orbits: Clear Other: N/A CT CERVICAL SPINE FINDINGS Alignment: Normal Skull base and vertebrae: Visualized skullbase intact. Incomplete posterior arch C1, developmental variant. Vertebral body heights maintained without fracture or bone destruction. Soft tissues and spinal canal: Prevertebral soft tissues normal thickness. Disc levels:  Disc space heights fairly well maintained Upper chest: Visualized lung apices grossly clear Other: N/A IMPRESSION: No acute cervical spine abnormalities. Generalized atrophy. Again identified low-attenuation mass with peripheral calcification at the medial aspect of the RIGHT middle cranial fossa extending into posterior fossa adjacent to the RIGHT lateral aspect of  the brainstem, 4.4 x 2.3 cm; based on very low attenuation, this could represent a dermoid or epidermoid tumor. No new intracranial abnormalities. Electronically Signed   By: Ulyses Southward M.D.   On: 01/12/2017 16:22   Dg Chest Port 1 View  Result Date: 01/13/2017 CLINICAL DATA:  Cough. EXAM: PORTABLE CHEST 1 VIEW COMPARISON:  None. FINDINGS: Elevated left  diaphragm, possibly related to gas dilated colon and stomach. When accounting for these overlapping interfaces there is no suspected pneumoperitoneum. The heart is shifted to the right and not definitively enlarged. The mediastinum is further distorted by scoliosis. Mild patchy density at the bases. IMPRESSION: 1. Mild nonspecific density at the bases which could be atelectasis, aspiration, or early infection. 2. Gas distended colon and stomach with left diaphragm elevation. Similar findings reported on 06/29/2000 abdominal radiograph. Electronically Signed   By: Marnee Spring M.D.   On: 01/13/2017 10:57   Dg Shoulder Left  Result Date: 01/12/2017 CLINICAL DATA:  Pain following fall EXAM: LEFT SHOULDER - 2+ VIEW COMPARISON:  None. FINDINGS: Internal rotation, external rotation, and Y scapular images were obtained. There is no evident fracture or dislocation. Joint spaces appear unremarkable. No erosive change. Visualized left lung clear. IMPRESSION: No fracture or dislocation.  No appreciable arthropathy. Electronically Signed   By: Bretta Bang III M.D.   On: 01/12/2017 16:14    EKG: No orders found for this or any previous visit.  ASSESSMENT AND PLAN:  Active Problems:   UTI (urinary tract infection)  #1. Urinary tract infection due to Escherichia coli, continue patient on Rocephin, follow closely #2. Confusion, likely delirium due to urinary tract infection, supportive therapy, IV fluids #3, diarrhea, change diet to full liquids, advance them as needed, get stool cultures for, and intestinal pathogens and C. Difficile #4. Hypokalemia,  supplement intravenously, check magnesium level #5. Hyponatremia, resolved on IV fluids #6. Leukocytosis, resolved #7. Hyperglycemia, get hemoglobin A1c   Management plans discussed with the patient, family and they are in agreement.   DRUG ALLERGIES:  Allergies  Allergen Reactions  . Zithromax [Azithromycin] Other (See Comments)    unknown    CODE STATUS:     Code Status Orders        Start     Ordered   01/12/17 1646  Full code  Continuous     01/12/17 1647    Code Status History    Date Active Date Inactive Code Status Order ID Comments User Context   This patient has a current code status but no historical code status.      TOTAL TIME TAKING CARE OF THIS PATIENT: 40 minutes.    Katharina Caper M.D on 01/13/2017 at 2:03 PM  Between 7am to 6pm - Pager - [phone]  After 6pm go to [email or web address] - password [email or web address] [facility]  [facility]  Office  [phone]  CC: Primary care physician; Rafael Bihari, MD

## 2017-01-13 NOTE — Progress Notes (Signed)
Left message for Richard Gillespie supervisor regarding patient. Need additional information on patient, unable to complete home care assessments.

## 2017-01-14 LAB — GLUCOSE, CAPILLARY: GLUCOSE-CAPILLARY: 94 mg/dL (ref 65–99)

## 2017-01-14 MED ORDER — ENSURE ENLIVE PO LIQD
237.0000 mL | Freq: Two times a day (BID) | ORAL | Status: DC
  Administered 2017-01-15 (×2): 237 mL via ORAL

## 2017-01-14 MED ORDER — CEPHALEXIN 250 MG/5ML PO SUSR
500.0000 mg | Freq: Three times a day (TID) | ORAL | Status: DC
  Administered 2017-01-14 – 2017-01-15 (×4): 500 mg via ORAL
  Filled 2017-01-14 (×5): qty 10

## 2017-01-14 MED ORDER — SODIUM CHLORIDE 0.9 % IV SOLN
INTRAVENOUS | Status: DC
  Filled 2017-01-14 (×5): qty 1000

## 2017-01-14 NOTE — Progress Notes (Signed)
This Clinical research associatewriter notifed Dr. Seth BakeV that pt's iv site has been lost. Orders to change antibiotics to po noted.

## 2017-01-14 NOTE — Clinical Social Work Note (Addendum)
Clinical Social Work Assessment  Patient Details  Name: Richard Gillespie MRN: [id number] Date of Birth: [birth year]  Date of referral:  01/14/17               Reason for consult:  Other (Comment Required) (From [facility]. )                Permission sought to share information with:    Permission granted to share information::     Name::        Agency::     Relationship::     Contact Information:     Housing/Transportation Living arrangements for the past 2 months:  Group Home Source of Information:  Facility Patient Interpreter Needed:  None Criminal Activity/Legal Involvement Pertinent to Current Situation/Hospitalization:  No - Comment as needed Significant Relationships:  Siblings Lives with:  Facility Resident Do you feel safe going back to the place where you live?    Need for family participation in patient care:  Yes (Comment)  Care giving concerns:  Patient is a long term care resident at [facility] group home located at [address] [place] [address] (fax: [phone]).     Social Worker assessment / plan:  Visual merchandiser (CSW) reviewed chart and noted that patient is from [facility] group home. Patient was pleasantly confused and could not answer questions. Per bedside RN patient is very pleasant and follows commands well. CSW contacted Surveyor, quantity at [facility] to get additional information. Per Richard Gillespie patient's brother Richard Gillespie is his guardian. CSW left a Engineer, technical sales for [place]. Per Richard Gillespie patient is independent with ADL's for the most part and needs a little coaching. Per Richard Gillespie patient is pleasant and compliant at baseline however he became more confused and was brought into [facility]. CSW made Richard Gillespie aware that patient has a UTI and E. Coli was found in his urine. CSW made Richard Gillespie aware that patient is on contact precautions for c-diff but per RN at [facility] patient has not had any loose stools to send down for a [employer] [place]. Per Richard Gillespie patient is on room  air at baseline and can return to group home when stable. CSW will continue to follow and assist as needed.    Patient's brother Richard Gillespie called CSW back and reported that he is patient's guardian but he does not handle his money. Per Richard Gillespie his cell phone # is [phone] and he lives near [place], [place]. Per brother patient has lived at [facility] group home for 40 years and he is agreeable for patient to return there.   Employment status:  Disabled (Comment on whether or not currently receiving Disability) Insurance information:  Medicare, Medicaid In [place] PT Recommendations:  Not assessed at this time Information / Referral to community resources:  Other (Comment Required) (Will return to group home. )  Patient/Family's Response to care:  Patient has been compliant and pleasant.   Patient/Family's Understanding of and Emotional Response to Diagnosis, Current Treatment, and Prognosis: Patient was pleasant.   Emotional Assessment Appearance:  Appears stated age Attitude/Demeanor/Rapport:    Affect (typically observed):  Pleasant Orientation:  Oriented to Self, Fluctuating Orientation (Suspected and/or reported Sundowners) Alcohol / Substance use:  Not Applicable Psych involvement (Current and /or in the community):  No (Comment)  Discharge Needs  Concerns to be addressed:  Discharge Planning Concerns Readmission within the last 30 days:  No Current discharge risk:  Cognitively Impaired Barriers to Discharge:  Continued Medical Work  up   Mackinley Cassaday, Darleen CrockerBailey M, LCSW 01/14/2017, 12:35 PM

## 2017-01-14 NOTE — Progress Notes (Signed)
Shift assessment completed at 0830. Pt able to swallow pills whole in applesauce, ate this and did not want breakfast. Pt able to make needs known, follows simple requests, says thank you with care. Pt is on room air, lungs clear bilat, hr is regular, abdomen is soft, bsh eard. Pt is wearing brief. piv #20 intact to r arm and wrapped in kling with iv ns with 20 meq kcl infusing at 1875mls/hr.ppp, teds on bilat, no edema noted. Pt's iv lost this afternoon, ordered meds changed. Pt combative one time with staff when he was needing to be changed and was fixated on the tv channel instead. Combativeness stopped when staff adressed his need first. Pt in no distress, incontinent of urine and stool this shift, pt's r inner buttock red but blanches. Pt did not appear to be in pain.

## 2017-01-14 NOTE — Progress Notes (Signed)
[facility] - [place] [facility]   PATIENT NAME: Richard DameMark Gillespie    MR#:  [id number]  DATE OF BIRTH:  Aug 07, 1951  SUBJECTIVE:  CHIEF COMPLAINT:   Chief Complaint  Patient presents with  . Fall  . Urinary Tract Infection   The patient is 66 year old Caucasian male with past medical history significant for history of seizure disorder, who presents to the hospital from the group home because of multiple episodes of fall, incontinence, diarrhea. Apparently, the patient was admitted to the emergency room a few days ago, diagnosed with UTI, discharged on ciprofloxacin back to group home. He was however noted to have confusion, diarrhea, incontinence, low-grade fever and was brought back. His urine cultures are growing Escherichia coli. Patient was initiated on Rocephin intravenously. Continues to have watery diarrhea, is not able to provide any review of systems due to mental retardation. Unable to get stool cultures due to watery stool, soaking through bedsheet. Denies abdominal pain  Review of Systems  Unable to perform ROS: Dementia    VITAL SIGNS: Blood pressure 140/86, pulse 72, temperature 98.6 F (37 C), resp. rate 18, height 5\' 5"  (1.651 m), weight 65.2 kg (143 lb 11.8 oz), SpO2 92 %.  PHYSICAL EXAMINATION:   GENERAL:  66 y.o.-year-old patient lying in the bed in mild distress, restless, uncomfortable, initially pushing  examiner away  EYES: Pupils equal, round, reactive to light and accommodation. No scleral icterus. Extraocular muscles intact.  HEENT: Head atraumatic, normocephalic. Oropharynx and nasopharynx clear.  NECK:  Supple, no jugular venous distention. No thyroid enlargement, no tenderness.  LUNGS: Normal breath sounds bilaterally, no wheezing, rales,rhonchi or crepitation. No use of accessory muscles of respiration.  CARDIOVASCULAR: S1, S2 normal. No murmurs, rubs, or gallops.  ABDOMEN: Moderately firm due to wound regarding related to inability  to relax, nontender, no discomfort on palpation, patient pushes examiners away, no rebound or guarding, nondistended. Bowel sounds present. No organomegaly or mass.  EXTREMITIES: No pedal edema, cyanosis, or clubbing.  NEUROLOGIC: Cranial nerves II through XII are intact. Muscle strength 5/5 in all extremities. Sensation intact. Gait not checked.  PSYCHIATRIC: The patient is alert , not oriented, nonverbal SKIN: No obvious rash, lesion, or ulcer.   ORDERS/RESULTS REVIEWED:   CBC  Recent Labs Lab 01/11/17 1710 01/12/17 1436 01/13/17 0342  WBC 6.9 11.7* 8.3  HGB 13.1 14.0 13.5  HCT 38.4* 39.4* 39.2*  PLT 145* 139* 122*  MCV 87.6 85.2 86.2  MCH 30.0 30.3 29.7  MCHC 34.2 35.6 34.4  RDW 13.0 13.1 13.4  LYMPHSABS 0.6* 1.0  --   MONOABS 0.9 1.1*  --   EOSABS 0.0 0.0  --   BASOSABS 0.0 0.0  --    ------------------------------------------------------------------------------------------------------------------  Chemistries   Recent Labs Lab 01/11/17 1710 01/12/17 1436 01/13/17 0342  NA 135 133* 138  K 3.6 3.1* 3.2*  CL 97* 100* 107  CO2 27 21* 22  GLUCOSE 115* 127* 104*  BUN 13 23* 18  CREATININE 0.95 1.23 0.76  CALCIUM 9.0 8.4* 8.2*  MG  --   --  2.0  AST  --  48*  --   ALT  --  29  --   ALKPHOS  --  49  --   BILITOT  --  0.8  --    ------------------------------------------------------------------------------------------------------------------ estimated creatinine clearance is 80.1 mL/min (by C-G formula based on SCr of 0.76 mg/dL). ------------------------------------------------------------------------------------------------------------------ No results for input(s): TSH, T4TOTAL, T3FREE, THYROIDAB in the last 72 hours.  Invalid  input(s): FREET3  Cardiac Enzymes No results for input(s): CKMB, TROPONINI, MYOGLOBIN in the last 168 hours.  Invalid input(s):  CK ------------------------------------------------------------------------------------------------------------------ Invalid input(s): POCBNP ---------------------------------------------------------------------------------------------------------------  RADIOLOGY: Dg Chest Port 1 View  Result Date: 01/13/2017 CLINICAL DATA:  Cough. EXAM: PORTABLE CHEST 1 VIEW COMPARISON:  None. FINDINGS: Elevated left diaphragm, possibly related to gas dilated colon and stomach. When accounting for these overlapping interfaces there is no suspected pneumoperitoneum. The heart is shifted to the right and not definitively enlarged. The mediastinum is further distorted by scoliosis. Mild patchy density at the bases. IMPRESSION: 1. Mild nonspecific density at the bases which could be atelectasis, aspiration, or early infection. 2. Gas distended colon and stomach with left diaphragm elevation. Similar findings reported on 06/29/2000 abdominal radiograph. Electronically Signed   By: Marnee Spring M.D.   On: 01/13/2017 10:57    EKG: No orders found for this or any previous visit.  ASSESSMENT AND PLAN:  Active Problems:   UTI (urinary tract infection)  #1. Urinary tract infection due to Escherichia coli, change Rocephin to cefazolin  #2. Mental retardation with behavioral abnormalities, questionable delirium due to urinary tract infection, supportive therapy, IV fluids, stable. No significant change from yesterday #3, diarrhea, continue full liquid diet, , unable to get stool cultures due to liquidy stool, soaking through bedsheets, cannot take patient off anti-precautions due to diarrhea, awaiting for stool cultures for gastrointestinal pathogens and C. difficile #4. Hypokalemia, supplementing intravenously, check potassium and magnesium level in a.m. #5. Hyponatremia, resolved on IV fluids #6. Leukocytosis, resolved #7. Hyperglycemia, hemoglobin A1c is pending   Management plans discussed with the patient,  family and they are in agreement.   DRUG ALLERGIES:  Allergies  Allergen Reactions  . Zithromax [Azithromycin] Other (See Comments)    unknown    CODE STATUS:     Code Status Orders        Start     Ordered   01/12/17 1646  Full code  Continuous     01/12/17 1647    Code Status History    Date Active Date Inactive Code Status Order ID Comments User Context   This patient has a current code status but no historical code status.      TOTAL TIME TAKING CARE OF THIS PATIENT: 30 minutes.    Katharina Caper M.D on 01/14/2017 at 4:19 PM  Between 7am to 6pm - Pager - [phone]  After 6pm go to [email or web address] - password [email or web address] [facility]  [place] [facility]  Office  [phone]  CC: Primary care physician; Rafael Bihari, MD

## 2017-01-14 NOTE — [place] FL2 (Signed)
[place] MEDICAID FL2 LEVEL OF CARE SCREENING TOOL     IDENTIFICATION  Patient Name: Richard Gillespie Birthdate: 1951/10/08 Sex: male Admission Date (Current Location): 01/12/2017  [facility] and [place] Number:  Chiropodist and Address:  [facility], [address], [place], [place] [address]      Provider Number: [id number]  Attending Physician Name and Address:  Katharina Caper, MD  Relative Name and Phone Number:       Current Level of Care: Hospital Recommended Level of Care: Family Care Home Prior Approval Number:    Date Approved/Denied:   PASRR Number:    Discharge Plan: Domiciliary (Rest home)    Current Diagnoses: Patient Active Problem List   Diagnosis Date Noted  . UTI (urinary tract infection) 01/12/2017    Orientation RESPIRATION BLADDER Height & Weight     Self  Normal Incontinent Weight: 143 lb 11.8 oz (65.2 kg) Height:   (165.1 cm)  BEHAVIORAL SYMPTOMS/MOOD NEUROLOGICAL BOWEL NUTRITION STATUS   (none)  (none) Incontinent Diet (Diet: Full Liquid )  AMBULATORY STATUS COMMUNICATION OF NEEDS Skin   Supervision Verbally Normal                       Personal Care Assistance Level of Assistance  Bathing, Feeding, Dressing Bathing Assistance: Limited assistance Feeding assistance: Independent Dressing Assistance: Limited assistance     Functional Limitations Info  Sight, Hearing, Speech Sight Info: Adequate Hearing Info: Adequate Speech Info: Adequate    SPECIAL CARE FACTORS FREQUENCY                       Contractures      Additional Factors Info  Code Status, Allergies, Isolation Precautions Code Status Info:  (Full Code. ) Allergies Info:  (Zithromax Azithromycin)  Enteric Precautions         Current Medications (01/14/2017):  This is the current hospital active medication list Current Facility-Administered Medications  Medication Dose Route Frequency Provider Last Rate Last Dose  .  acetaminophen (TYLENOL) tablet 650 mg  650 mg Oral Q6H PRN Katha Hamming, MD       Or  . acetaminophen (TYLENOL) suppository 650 mg  650 mg Rectal Q6H PRN Katha Hamming, MD      . carbamazepine (TEGRETOL XR) 12 hr tablet 100 mg  100 mg Oral QHS Katha Hamming, MD      . carbamazepine (TEGRETOL XR) 12 hr tablet 200 mg  200 mg Oral QID Katha Hamming, MD   200 mg at 01/14/17 0859  . cefTRIAXone (ROCEPHIN) 1 g in dextrose 5 % 50 mL IVPB  1 g Intravenous Q24H Katha Hamming, MD   1 g at 01/13/17 1800  . heparin injection 5,000 Units  5,000 Units Subcutaneous Q8H Katha Hamming, MD   5,000 Units at 01/14/17 0544  . lamoTRIgine (LAMICTAL) tablet 50 mg  50 mg Oral BID Katha Hamming, MD   50 mg at 01/14/17 0858  . ondansetron (ZOFRAN) tablet 4 mg  4 mg Oral Q6H PRN Katha Hamming, MD       Or  . ondansetron (ZOFRAN) injection 4 mg  4 mg Intravenous Q6H PRN Katha Hamming, MD      . sodium chloride 0.9 % 1,000 mL with potassium chloride 20 mEq infusion   Intravenous Continuous Katharina Caper, MD      . traZODone (DESYREL) tablet 150 mg  150 mg Oral QHS PRN Katha Hamming, MD   150  mg at 01/13/17 2128     Discharge Medications: Please see discharge summary for a list of discharge medications.  Relevant Imaging Results:  Relevant Lab Results:   Additional Information  SSN: [id number]  Sample, Darleen Crocker, LCSW

## 2017-01-14 NOTE — Progress Notes (Signed)
Initial Nutrition Assessment  DOCUMENTATION CODES:   Not applicable  INTERVENTION:  1. Ensure Enlive po BID, each supplement provides 350 kcal and 20 grams of protein  NUTRITION DIAGNOSIS:   Inadequate oral intake related to poor appetite as evidenced by per patient/family report.  GOAL:   Patient will meet greater than or equal to 90% of their needs  MONITOR:   PO intake, Skin, I & O's, Supplement acceptance, Labs  REASON FOR ASSESSMENT:   Malnutrition Screening Tool    ASSESSMENT:   Richard Gillespie  is a 66 y.o. male with a known history of mental retardation,seizure disorder [facility] because of multiple episodes of falls today, incontinence, diarrhea.  Attempted to speak with [facility], unable to retrieve history due to mental retardation. Currently on full liquids - PO 50% thus far. Per RN, had to feed patient to prevent agitation. Apparently performs most ADLs independently at baseline. No evidence of weight loss in chart, per care everywhere weight has been stable. Nutrition-Focused physical exam completed. Findings are moderate fat depletion, moderate muscle depletion, and no edema.  Labs and medications reviewed: K 3.2 NS w/ KCL 20mEq @ 875mL/hr  Diet Order:  Diet full liquid Room service appropriate? Yes; Fluid consistency: Thin  Skin:  Reviewed, no issues  Last BM:  01/13/2017  Height:   Ht Readings from Last 1 Encounters:  01/12/17 5\' 5"  (1.651 m)    Weight:   Wt Readings from Last 1 Encounters:  01/14/17 143 lb 11.8 oz (65.2 kg)    Ideal Body Weight:  59.09 kg  BMI:  Body mass index is 23.92 kg/m.  Estimated Nutritional Needs:   Kcal:  1650-1800 calories  Protein:  65-78 gm  Fluid:  >/= 1.4L  EDUCATION NEEDS:   No education needs identified at this time  Dionne AnoWilliam M. Ekaterini Capitano, MS, RD LDN Inpatient Clinical Dietitian Pager [phone]

## 2017-01-15 DIAGNOSIS — R451 Restlessness and agitation: Secondary | ICD-10-CM

## 2017-01-15 DIAGNOSIS — A498 Other bacterial infections of unspecified site: Secondary | ICD-10-CM

## 2017-01-15 DIAGNOSIS — R197 Diarrhea, unspecified: Secondary | ICD-10-CM

## 2017-01-15 DIAGNOSIS — R739 Hyperglycemia, unspecified: Secondary | ICD-10-CM

## 2017-01-15 DIAGNOSIS — D72829 Elevated white blood cell count, unspecified: Secondary | ICD-10-CM

## 2017-01-15 DIAGNOSIS — E876 Hypokalemia: Secondary | ICD-10-CM

## 2017-01-15 DIAGNOSIS — E871 Hypo-osmolality and hyponatremia: Secondary | ICD-10-CM

## 2017-01-15 LAB — BASIC METABOLIC PANEL
Anion gap: 9 (ref 5–15)
BUN: 9 mg/dL (ref 6–20)
CALCIUM: 8.2 mg/dL — AB (ref 8.9–10.3)
CHLORIDE: 104 mmol/L (ref 101–111)
CO2: 26 mmol/L (ref 22–32)
CREATININE: 0.72 mg/dL (ref 0.61–1.24)
GFR calc Af Amer: >60 mL/min (ref >60)
GFR calc non Af Amer: >60 mL/min (ref >60)
Glucose, Bld: 96 mg/dL (ref 65–99)
Potassium: 3.1 mmol/L — ABNORMAL LOW (ref 3.5–5.1)
Sodium: 139 mmol/L (ref 135–145)

## 2017-01-15 LAB — HEMOGLOBIN A1C
HEMOGLOBIN A1C: 5.3 % (ref 4.8–5.6)
Mean Plasma Glucose: 105 mg/dL

## 2017-01-15 MED ORDER — POTASSIUM CHLORIDE CRYS ER 20 MEQ PO TBCR
40.0000 meq | EXTENDED_RELEASE_TABLET | ORAL | Status: AC
  Administered 2017-01-15 (×2): 40 meq via ORAL
  Filled 2017-01-15 (×2): qty 2

## 2017-01-15 MED ORDER — ENSURE ENLIVE PO LIQD: 237.0000 mL | Freq: Two times a day (BID) | ORAL | 12 refills | Status: DC

## 2017-01-15 MED ORDER — CEPHALEXIN 250 MG/5ML PO SUSR: 500.0000 mg | Freq: Three times a day (TID) | ORAL | 0 refills | Status: DC

## 2017-01-15 NOTE — Progress Notes (Signed)
Pt being discharged back to group home this afternoon. No PIV in place. Report called to Lupita Leashonna RN from home, all questions answered. Discharge instructions sent with pt, prescriptions sent in packet. He is leaving with all his belongings. He will follow up with PCP on 01-22-2017 at 11:30am. Will be transported via private vehicle.

## 2017-01-15 NOTE — Progress Notes (Signed)
Patient is medically stable for D/C back to [facility] today. Per Richard Leashonna RN at [facility] they will provide transport and pick patient up today around 4 pm. Clinical Social Worker (CSW) sent D/C Summary and FL2 to [facility]. RN aware of above. CSW contacted patient's guardian/ brother Richard Gillespie and made him aware of above. Please reconsult if future social work needs arise. CSW signing off.   Baker Hughes IncorporatedBailey Indiana Gamero, LCSW [phone]

## 2017-01-15 NOTE — NC FL2 (Signed)
[place] MEDICAID FL2 LEVEL OF CARE SCREENING TOOL     IDENTIFICATION  Patient Name: Richard Gillespie Birthdate: [birth year] Sex: male Admission Date (Current Location): 01/12/2017  [facility] [place]   Facility and Address:  [facility], [address], [place], [place] [address]      Provider Number: [id number]  Attending Physician Name and Address:  Katharina Caperima Phoenyx Melka, MD  Relative Name and Phone Number:       Current Level of Care: Hospital Recommended Level of Care: Family Care Home Prior Approval Number:    Date Approved/Denied:   PASRR Number:    Discharge Plan: Domiciliary (Rest home)    Current Diagnoses: Patient Active Problem List   Diagnosis Date Noted  . E coli infection 01/15/2017  . Agitation 01/15/2017  . Diarrhea 01/15/2017  . Hypokalemia 01/15/2017  . Hyponatremia 01/15/2017  . Leukocytosis 01/15/2017  . Hyperglycemia 01/15/2017  . UTI (urinary tract infection) 01/12/2017    Orientation RESPIRATION BLADDER Height & Weight     Self  Normal Incontinent Weight: 137 lb 12.8 oz (62.5 kg) Height:  5\' 5"  (165.1 cm)  BEHAVIORAL SYMPTOMS/MOOD NEUROLOGICAL BOWEL NUTRITION STATUS   (none)  (none) Incontinent Diet: Low Sodium/ Heart Healthy   AMBULATORY STATUS COMMUNICATION OF NEEDS Skin   Supervision Verbally Normal                       Personal Care Assistance Level of Assistance  Bathing, Feeding, Dressing Bathing Assistance: Limited assistance Feeding assistance: Independent Dressing Assistance: Limited assistance     Functional Limitations Info  Sight, Hearing, Speech Sight Info: Adequate Hearing Info: Adequate Speech Info: Adequate    SPECIAL CARE FACTORS FREQUENCY                       Contractures      Additional Factors Info  Code Status, Allergies, Isolation Precautions Code Status Info:  (Full Code. ) Allergies Info:  (Zithromax Azithromycin)          Discharge  Medications: Please see discharge summary for a list of discharge medications. DISCHARGE MEDICATIONS:       Current Discharge Medication List        START taking these medications   Details  cephALEXin (KEFLEX) 250 MG/5ML suspension Take 10 mLs (500 mg total) by mouth every 8 (eight) hours. Qty: 100 mL, Refills: 0    feeding supplement, ENSURE ENLIVE, (ENSURE ENLIVE) LIQD Take 237 mLs by mouth 2 (two) times daily between meals. Qty: 237 mL, Refills: 12          CONTINUE these medications which have NOT CHANGED   Details  bisacodyl (DULCOLAX) 10 MG suppository Place 10 mg rectally as needed for moderate constipation.    !! carbamazepine (TEGRETOL XR) 100 MG 12 hr tablet Take 100 mg by mouth at bedtime.    !! carbamazepine (TEGRETOL XR) 200 MG 12 hr tablet Take 200 mg by mouth 4 (four) times daily. 0700,1200,1600,1900    !! cloNIDine HCl (KAPVAY) 0.1 MG TB12 ER tablet Take by mouth daily.    !! cloNIDine HCl (KAPVAY) 0.1 MG TB12 ER tablet Take 0.2 mg by mouth at bedtime.    lactulose (CHRONULAC) 10 GM/15ML solution Take 20 g by mouth 2 (two) times daily as needed for mild constipation.    lamoTRIgine (LAMICTAL) 25 MG tablet Take 50 mg by mouth 2 (two) times daily.    loratadine (CLARITIN) 10 MG tablet  Take 10 mg by mouth daily.    LORazepam (ATIVAN) 0.5 MG tablet Take 0.5 mg by mouth as needed for anxiety.    magnesium citrate SOLN 1 Bottle. "give 10 ounces by mouth every day as needed for NO bowel movement in 2 days"    medroxyPROGESTERone (PROVERA) 10 MG tablet Take 10 mg by mouth daily.    Multiple Vitamin (THEREMS) TABS Take 1 tablet by mouth daily.    mupirocin nasal ointment (BACTROBAN) 2 % Place 1 application into the nose 2 (two) times daily. Use one-half of tube in each nostril twice daily for five (5) days. After application, press sides of nose together and gently massage.    polyethylene glycol (MIRALAX / GLYCOLAX) packet Take 17 g by mouth  daily as needed.    senna (SENOKOT) 8.6 MG TABS tablet Take 1 tablet by mouth 2 (two) times daily.    traZODone (DESYREL) 150 MG tablet Take 300 mg by mouth at bedtime.     !! - Potential duplicate medications found. Please discuss with provider.       STOP taking these medications     ciprofloxacin (CIPRO) 250 MG tablet       Relevant Imaging Results: Relevant Lab Results: Additional Information   SSN: [id number]  Sample, Darleen Crocker, LCSW

## 2017-01-15 NOTE — Discharge Summary (Signed)
[facility] [place] at [facility]   PATIENT NAME: Richard Gillespie    MR#:  [id number]  DATE OF BIRTH:  03/19/1951  DATE OF ADMISSION:  01/12/2017 ADMITTING PHYSICIAN: Katha Hamming, MD  DATE OF DISCHARGE: No discharge date for patient encounter.  PRIMARY CARE PHYSICIAN: Rafael Bihari, MD     ADMISSION DIAGNOSIS:  Pyelonephritis [N12]  DISCHARGE DIAGNOSIS:  Active Problems:   UTI (urinary tract infection)   E coli infection   Diarrhea   Leukocytosis   Agitation   Hypokalemia   Hyponatremia   Hyperglycemia   SECONDARY DIAGNOSIS:   Past Medical History:  Diagnosis Date  . Mental retardation   . Seizures (HCC)     .pro HOSPITAL COURSE:   The patient is 66 year old Caucasian male with past medical history significant for history of mental retardation, seizure disorder, who presents to the hospital from the group home because of multiple episodes of fall, incontinence, diarrhea. Apparently, the patient was admitted to the emergency room a few days ago, diagnosed with UTI, discharged on ciprofloxacin back to group home. He was however noted to have confusion, diarrhea, incontinence, low-grade fever and was brought back. His urine cultures are growing Escherichia coli. Patient was initiated on Rocephin intravenously. We were nable to get stool cultures due to watery stool, mixed up his urine, soaking through bedsheet. Denies abdominal pain. With conservative therapy, patient's condition improved and his diarrhea stopped. He was felt to be stable to be discharged back to skilled nursing facility today Discussion by problem: #1. Urinary tract infection due to Escherichia coli, continue Keflex for 3 more days to complete course  #2. Mental retardation with behavioral abnormalities, agitation, likely related to hospitalization, improved with supportive therapy  #3, diarrhea, resolved, unable to get stool cultures due to liquidy stool, soaking through  bedsheets, oral intake was good on the day of discharge.  #4. Hypokalemia, supplemented intravenously, it is recommended to recheck potassium and magnesium levels  as outpatient #5. Hyponatremia, resolved on IV fluids, likely dehydration related #6. Leukocytosis, resolved with antibiotic therapy #7. Hyperglycemia, hemoglobin A1c  was 5.3, no diabetes DISCHARGE CONDITIONS:   Stable  CONSULTS OBTAINED:    DRUG ALLERGIES:   Allergies  Allergen Reactions  . Zithromax [Azithromycin] Other (See Comments)    unknown    DISCHARGE MEDICATIONS:   Current Discharge Medication List    START taking these medications   Details  cephALEXin (KEFLEX) 250 MG/5ML suspension Take 10 mLs (500 mg total) by mouth every 8 (eight) hours. Qty: 100 mL, Refills: 0    feeding supplement, ENSURE ENLIVE, (ENSURE ENLIVE) LIQD Take 237 mLs by mouth 2 (two) times daily between meals. Qty: 237 mL, Refills: 12      CONTINUE these medications which have NOT CHANGED   Details  bisacodyl (DULCOLAX) 10 MG suppository Place 10 mg rectally as needed for moderate constipation.    !! carbamazepine (TEGRETOL XR) 100 MG 12 hr tablet Take 100 mg by mouth at bedtime.    !! carbamazepine (TEGRETOL XR) 200 MG 12 hr tablet Take 200 mg by mouth 4 (four) times daily. 0700,1200,1600,1900    !! cloNIDine HCl (KAPVAY) 0.1 MG TB12 ER tablet Take by mouth daily.    !! cloNIDine HCl (KAPVAY) 0.1 MG TB12 ER tablet Take 0.2 mg by mouth at bedtime.    lactulose (CHRONULAC) 10 GM/15ML solution Take 20 g by mouth 2 (two) times daily as needed for mild constipation.    lamoTRIgine (LAMICTAL) 25  MG tablet Take 50 mg by mouth 2 (two) times daily.    loratadine (CLARITIN) 10 MG tablet Take 10 mg by mouth daily.    LORazepam (ATIVAN) 0.5 MG tablet Take 0.5 mg by mouth as needed for anxiety.    magnesium citrate SOLN 1 Bottle. "give 10 ounces by mouth every day as needed for NO bowel movement in 2 days"    medroxyPROGESTERone  (PROVERA) 10 MG tablet Take 10 mg by mouth daily.    Multiple Vitamin (THEREMS) TABS Take 1 tablet by mouth daily.    mupirocin nasal ointment (BACTROBAN) 2 % Place 1 application into the nose 2 (two) times daily. Use one-half of tube in each nostril twice daily for five (5) days. After application, press sides of nose together and gently massage.    polyethylene glycol (MIRALAX / GLYCOLAX) packet Take 17 g by mouth daily as needed.    senna (SENOKOT) 8.6 MG TABS tablet Take 1 tablet by mouth 2 (two) times daily.    traZODone (DESYREL) 150 MG tablet Take 300 mg by mouth at bedtime.     !! - Potential duplicate medications found. Please discuss with provider.    STOP taking these medications     ciprofloxacin (CIPRO) 250 MG tablet          DISCHARGE INSTRUCTIONS:    The patient is to follow-up with primary care physician within one week after discharge  If you experience worsening of your admission symptoms, develop shortness of breath, life threatening emergency, suicidal or homicidal thoughts you must seek medical attention immediately by calling 911 or calling your MD immediately  if symptoms less severe.  You Must read complete instructions/literature along with all the possible adverse reactions/side effects for all the Medicines you take and that have been prescribed to you. Take any new Medicines after you have completely understood and accept all the possible adverse reactions/side effects.   Please note  You were cared for by a hospitalist during your hospital stay. If you have any questions about your discharge medications or the care you received while you were in the hospital after you are discharged, you can call the unit and asked to speak with the hospitalist on call if the hospitalist that took care of you is not available. Once you are discharged, your primary care physician will handle any further medical issues. Please note that NO REFILLS for any discharge  medications will be authorized once you are discharged, as it is imperative that you return to your primary care physician (or establish a relationship with a primary care physician if you do not have one) for your aftercare needs so that they can reassess your need for medications and monitor your lab values.    Today   CHIEF COMPLAINT:   Chief Complaint  Patient presents with  . Fall  . Urinary Tract Infection    HISTORY OF PRESENT ILLNESS:  Micholas Drumwright  is a 66 y.o. male with a known history of mental retardation, seizure disorder, who presents to the hospital from the group home because of multiple episodes of fall, incontinence, diarrhea. Apparently, the patient was admitted to the emergency room a few days ago, diagnosed with UTI, discharged on ciprofloxacin back to group home. He was however noted to have confusion, diarrhea, incontinence, low-grade fever and was brought back. His urine cultures are growing Escherichia coli. Patient was initiated on Rocephin intravenously. We were nable to get stool cultures due to watery stool, mixed up his urine,  soaking through bedsheet. Denies abdominal pain. With conservative therapy, patient's condition improved and his diarrhea stopped. He was felt to be stable to be discharged back to skilled nursing facility today Discussion by problem: #1. Urinary tract infection due to Escherichia coli, continue Keflex for 3 more days to complete course  #2. Mental retardation with behavioral abnormalities, agitation, likely related to hospitalization, improved with supportive therapy  #3, diarrhea, resolved, unable to get stool cultures due to liquidy stool, soaking through bedsheets, oral intake was good on the day of discharge.  #4. Hypokalemia, supplemented intravenously, it is recommended to recheck potassium and magnesium levels  as outpatient #5. Hyponatremia, resolved on IV fluids, likely dehydration related #6. Leukocytosis, resolved with antibiotic  therapy #7. Hyperglycemia, hemoglobin A1c  was 5.3, no diabetes   VITAL SIGNS:  Blood pressure (!) 122/58, pulse 69, temperature 98.4 F (36.9 C), temperature source Oral, resp. rate 18, height 5\' 5"  (1.651 m), weight 62.5 kg (137 lb 12.8 oz), SpO2 98 %.  I/O:   Intake/Output Summary (Last 24 hours) at 01/15/17 1445 Last data filed at 01/14/17 2243  Gross per 24 hour  Intake              120 ml  Output                0 ml  Net              120 ml    PHYSICAL EXAMINATION:  GENERAL:  66 y.o.-year-old patient lying in the bed with no acute distress.  EYES: Pupils equal, round, reactive to light and accommodation. No scleral icterus. Extraocular muscles intact.  HEENT: Head atraumatic, normocephalic. Oropharynx and nasopharynx clear.  NECK:  Supple, no jugular venous distention. No thyroid enlargement, no tenderness.  LUNGS: Normal breath sounds bilaterally, no wheezing, rales,rhonchi or crepitation. No use of accessory muscles of respiration.  CARDIOVASCULAR: S1, S2 normal. No murmurs, rubs, or gallops.  ABDOMEN: Soft, non-tender, non-distended. Bowel sounds present. No organomegaly or mass.  EXTREMITIES: No pedal edema, cyanosis, or clubbing.  NEUROLOGIC: Cranial nerves II through XII are intact. Muscle strength 5/5 in all extremities. Sensation intact. Gait not checked.  PSYCHIATRIC: The patient is alert and oriented x 3.  SKIN: No obvious rash, lesion, or ulcer.   DATA REVIEW:   CBC  Recent Labs Lab 01/13/17 0342  WBC 8.3  HGB 13.5  HCT 39.2*  PLT 122*    Chemistries   Recent Labs Lab 01/12/17 1436 01/13/17 0342 01/15/17 0512  NA 133* 138 139  K 3.1* 3.2* 3.1*  CL 100* 107 104  CO2 21* 22 26  GLUCOSE 127* 104* 96  BUN 23* 18 9  CREATININE 1.23 0.76 0.72  CALCIUM 8.4* 8.2* 8.2*  MG  --  2.0  --   AST 48*  --   --   ALT 29  --   --   ALKPHOS 49  --   --   BILITOT 0.8  --   --     Cardiac Enzymes No results for input(s): TROPONINI in the last 168  hours.  Microbiology Results  Results for orders placed or performed during the hospital encounter of 01/12/17  MRSA PCR Screening     Status: None   Collection Time: 01/12/17  9:21 PM  Result Value Ref Range Status   MRSA by PCR NEGATIVE NEGATIVE Final    Comment:        The GeneXpert MRSA Assay (FDA approved for NASAL specimens only),  is one component of a comprehensive MRSA colonization surveillance program. It is not intended to diagnose MRSA infection nor to guide or monitor treatment for MRSA infections.     RADIOLOGY:  No results found.  EKG:  No orders found for this or any previous visit.    Management plans discussed with the patient, family and they are in agreement.  CODE STATUS:     Code Status Orders        Start     Ordered   01/12/17 1646  Full code  Continuous     01/12/17 1647    Code Status History    Date Active Date Inactive Code Status Order ID Comments User Context   This patient has a current code status but no historical code status.      TOTAL TIME TAKING CARE OF THIS PATIENT: 40 minutes.    Katharina CaperVAICKUTE,Zaxton Angerer M.D on 01/15/2017 at 2:45 PM  Between 7am to 6pm - Pager - [phone]  After 6pm go to [email or web address] - password [email or web address] [facility]  [facility]  Office  [phone]  CC: Primary care physician; Rafael BihariWALKER III, JOHN B, MD

## 2017-01-15 NOTE — Care Management Important Message (Signed)
Important Message  Patient Details  Name: Richard Gillespie MRN: [id number] Date of Birth: 08/17/1951   Medicare Important Message Given:  Yes    Marily MemosLisa M Aiyannah Fayad, RN 01/15/2017, 11:05 AM

## 2018-09-12 ENCOUNTER — Emergency Department: Payer: Medicare Other

## 2018-09-12 ENCOUNTER — Other Ambulatory Visit: Payer: Self-pay

## 2018-09-12 ENCOUNTER — Encounter: Payer: Self-pay | Admitting: Emergency Medicine

## 2018-09-12 ENCOUNTER — Inpatient Hospital Stay
Admission: EM | Admit: 2018-09-12 | Discharge: 2018-09-24 | DRG: 388 | Disposition: A | Discharge status: Home / Self Care | Payer: Medicare Other | Source: Ambulatory Visit | Attending: Internal Medicine | Admitting: Internal Medicine

## 2018-09-12 DIAGNOSIS — F72 Severe intellectual disabilities: Secondary | ICD-10-CM | POA: Diagnosis present

## 2018-09-12 DIAGNOSIS — J181 Lobar pneumonia, unspecified organism: Secondary | ICD-10-CM

## 2018-09-12 DIAGNOSIS — Z6820 Body mass index (BMI) 20.0-20.9, adult: Secondary | ICD-10-CM

## 2018-09-12 DIAGNOSIS — E440 Moderate protein-calorie malnutrition: Secondary | ICD-10-CM | POA: Diagnosis present

## 2018-09-12 DIAGNOSIS — J690 Pneumonitis due to inhalation of food and vomit: Secondary | ICD-10-CM | POA: Diagnosis present

## 2018-09-12 DIAGNOSIS — G40909 Epilepsy, unspecified, not intractable, without status epilepticus: Secondary | ICD-10-CM | POA: Diagnosis present

## 2018-09-12 DIAGNOSIS — E870 Hyperosmolality and hypernatremia: Secondary | ICD-10-CM | POA: Diagnosis present

## 2018-09-12 DIAGNOSIS — Z23 Encounter for immunization: Secondary | ICD-10-CM | POA: Diagnosis present

## 2018-09-12 DIAGNOSIS — K56609 Unspecified intestinal obstruction, unspecified as to partial versus complete obstruction: Secondary | ICD-10-CM

## 2018-09-12 DIAGNOSIS — A419 Sepsis, unspecified organism: Secondary | ICD-10-CM

## 2018-09-12 DIAGNOSIS — K56600 Partial intestinal obstruction, unspecified as to cause: Secondary | ICD-10-CM | POA: Diagnosis present

## 2018-09-12 DIAGNOSIS — E860 Dehydration: Secondary | ICD-10-CM | POA: Diagnosis present

## 2018-09-12 DIAGNOSIS — E876 Hypokalemia: Secondary | ICD-10-CM | POA: Diagnosis not present

## 2018-09-12 DIAGNOSIS — Z66 Do not resuscitate: Secondary | ICD-10-CM | POA: Diagnosis not present

## 2018-09-12 DIAGNOSIS — I10 Essential (primary) hypertension: Secondary | ICD-10-CM | POA: Diagnosis present

## 2018-09-12 DIAGNOSIS — J189 Pneumonia, unspecified organism: Secondary | ICD-10-CM

## 2018-09-12 DIAGNOSIS — R451 Restlessness and agitation: Secondary | ICD-10-CM | POA: Diagnosis not present

## 2018-09-12 DIAGNOSIS — Z4659 Encounter for fitting and adjustment of other gastrointestinal appliance and device: Secondary | ICD-10-CM

## 2018-09-12 DIAGNOSIS — R062 Wheezing: Secondary | ICD-10-CM

## 2018-09-12 DIAGNOSIS — Z0189 Encounter for other specified special examinations: Secondary | ICD-10-CM

## 2018-09-12 DIAGNOSIS — R0902 Hypoxemia: Secondary | ICD-10-CM | POA: Diagnosis present

## 2018-09-12 DIAGNOSIS — Z8719 Personal history of other diseases of the digestive system: Secondary | ICD-10-CM

## 2018-09-12 LAB — COMPREHENSIVE METABOLIC PANEL
ALK PHOS: 64 U/L (ref 38–126)
ALT: 31 U/L (ref 0–44)
ANION GAP: 13 (ref 5–15)
AST: 44 U/L — ABNORMAL HIGH (ref 15–41)
Albumin: 5.1 g/dL — ABNORMAL HIGH (ref 3.5–5.0)
BUN: 27 mg/dL — ABNORMAL HIGH (ref 8–23)
CALCIUM: 9.7 mg/dL (ref 8.9–10.3)
CO2: 28 mmol/L (ref 22–32)
CREATININE: 1.08 mg/dL (ref 0.61–1.24)
Chloride: 96 mmol/L — ABNORMAL LOW (ref 98–111)
Glucose, Bld: 148 mg/dL — ABNORMAL HIGH (ref 70–99)
Potassium: 3.7 mmol/L (ref 3.5–5.1)
Sodium: 137 mmol/L (ref 135–145)
Total Bilirubin: 0.9 mg/dL (ref 0.3–1.2)
Total Protein: 8.5 g/dL — ABNORMAL HIGH (ref 6.5–8.1)

## 2018-09-12 LAB — CBC
HCT: 46.8 % (ref 39.0–52.0)
Hemoglobin: 16.1 g/dL (ref 13.0–17.0)
MCH: 29.9 pg (ref 26.0–34.0)
MCHC: 34.4 g/dL (ref 30.0–36.0)
MCV: 87 fL (ref 80.0–100.0)
NRBC: 0 % (ref 0.0–0.2)
PLATELETS: 257 10*3/uL (ref 150–400)
RBC: 5.38 MIL/uL (ref 4.22–5.81)
RDW: 12.3 % (ref 11.5–15.5)
WBC: 15.3 10*3/uL — AB (ref 4.0–10.5)

## 2018-09-12 LAB — LACTIC ACID, PLASMA: LACTIC ACID, VENOUS: 1.1 mmol/L (ref 0.5–1.9)

## 2018-09-12 LAB — LIPASE, BLOOD: LIPASE: 47 U/L (ref 11–51)

## 2018-09-12 MED ORDER — IOPAMIDOL (ISOVUE-300) INJECTION 61%
100.0000 mL | Freq: Once | INTRAVENOUS | Status: AC | PRN
  Administered 2018-09-12: 100 mL via INTRAVENOUS

## 2018-09-12 MED ORDER — SODIUM CHLORIDE 0.9 % IV SOLN
2.0000 g | Freq: Two times a day (BID) | INTRAVENOUS | Status: DC
  Administered 2018-09-13: 2 g via INTRAVENOUS
  Filled 2018-09-12 (×3): qty 2

## 2018-09-12 MED ORDER — TRAZODONE HCL 100 MG PO TABS
300.0000 mg | ORAL_TABLET | Freq: Every day | ORAL | Status: DC
  Administered 2018-09-13 – 2018-09-23 (×10): 300 mg via ORAL
  Filled 2018-09-12 (×10): qty 3

## 2018-09-12 MED ORDER — ONDANSETRON HCL 4 MG PO TABS: 4.0000 mg | ORAL_TABLET | Freq: Four times a day (QID) | ORAL | Status: DC | PRN

## 2018-09-12 MED ORDER — LORAZEPAM 0.5 MG PO TABS: 0.5000 mg | ORAL_TABLET | ORAL | Status: DC | PRN

## 2018-09-12 MED ORDER — OXYCODONE HCL 5 MG PO TABS: 5.0000 mg | ORAL_TABLET | ORAL | Status: DC | PRN

## 2018-09-12 MED ORDER — VANCOMYCIN HCL 10 G IV SOLR
1250.0000 mg | INTRAVENOUS | Status: DC
  Administered 2018-09-13: 1250 mg via INTRAVENOUS
  Filled 2018-09-12 (×3): qty 1250

## 2018-09-12 MED ORDER — LAMOTRIGINE 25 MG PO TABS
75.0000 mg | ORAL_TABLET | Freq: Two times a day (BID) | ORAL | Status: DC
  Administered 2018-09-13 (×2): 75 mg via ORAL
  Filled 2018-09-12 (×2): qty 3

## 2018-09-12 MED ORDER — CLONIDINE HCL ER 0.1 MG PO TB12
0.1000 mg | ORAL_TABLET | Freq: Every day | ORAL | Status: DC
  Administered 2018-09-13 – 2018-09-24 (×11): 0.1 mg via ORAL
  Filled 2018-09-12 (×12): qty 1

## 2018-09-12 MED ORDER — ACETAMINOPHEN 325 MG PO TABS: 650.0000 mg | ORAL_TABLET | Freq: Four times a day (QID) | ORAL | Status: DC | PRN

## 2018-09-12 MED ORDER — SODIUM CHLORIDE 0.9 % IV SOLN
INTRAVENOUS | Status: AC
  Administered 2018-09-13: via INTRAVENOUS

## 2018-09-12 MED ORDER — CLONIDINE HCL ER 0.1 MG PO TB12
0.2000 mg | ORAL_TABLET | Freq: Every day | ORAL | Status: DC
  Administered 2018-09-13 – 2018-09-23 (×10): 0.2 mg via ORAL
  Filled 2018-09-12 (×13): qty 2

## 2018-09-12 MED ORDER — CARBAMAZEPINE ER 200 MG PO TB12
200.0000 mg | ORAL_TABLET | Freq: Four times a day (QID) | ORAL | Status: DC
  Administered 2018-09-13 (×2): 200 mg via ORAL
  Filled 2018-09-12 (×6): qty 1

## 2018-09-12 MED ORDER — INFLUENZA VAC SPLIT HIGH-DOSE 0.5 ML IM SUSY
0.5000 mL | PREFILLED_SYRINGE | INTRAMUSCULAR | Status: AC
  Administered 2018-09-16: 0.5 mL via INTRAMUSCULAR
  Filled 2018-09-12 (×2): qty 0.5

## 2018-09-12 MED ORDER — ONDANSETRON HCL 4 MG/2ML IJ SOLN
4.0000 mg | Freq: Four times a day (QID) | INTRAMUSCULAR | Status: DC | PRN
  Administered 2018-09-13 – 2018-09-14 (×2): 4 mg via INTRAVENOUS
  Filled 2018-09-12 (×2): qty 2

## 2018-09-12 MED ORDER — ACETAMINOPHEN 650 MG RE SUPP: 650.0000 mg | Freq: Four times a day (QID) | RECTAL | Status: DC | PRN

## 2018-09-12 MED ORDER — PNEUMOCOCCAL VAC POLYVALENT 25 MCG/0.5ML IJ INJ
0.5000 mL | INJECTION | INTRAMUSCULAR | Status: AC
  Administered 2018-09-16: 0.5 mL via INTRAMUSCULAR
  Filled 2018-09-12: qty 0.5

## 2018-09-12 MED ORDER — VANCOMYCIN HCL IN DEXTROSE 1-5 GM/200ML-% IV SOLN
1000.0000 mg | Freq: Once | INTRAVENOUS | Status: AC
  Administered 2018-09-12: 1000 mg via INTRAVENOUS
  Filled 2018-09-12: qty 200

## 2018-09-12 MED ORDER — CARBAMAZEPINE ER 100 MG PO TB12
100.0000 mg | ORAL_TABLET | Freq: Every day | ORAL | Status: DC
  Administered 2018-09-13: 100 mg via ORAL
  Filled 2018-09-12 (×2): qty 1

## 2018-09-12 MED ORDER — SODIUM CHLORIDE 0.9 % IV SOLN
2.0000 g | Freq: Once | INTRAVENOUS | Status: AC
  Administered 2018-09-12: 2 g via INTRAVENOUS
  Filled 2018-09-12: qty 2

## 2018-09-12 MED ORDER — ENOXAPARIN SODIUM 40 MG/0.4ML ~~LOC~~ SOLN
40.0000 mg | SUBCUTANEOUS | Status: DC
  Administered 2018-09-13 – 2018-09-23 (×11): 40 mg via SUBCUTANEOUS
  Filled 2018-09-12 (×11): qty 0.4

## 2018-09-12 NOTE — Progress Notes (Signed)
Pharmacy Antibiotic Note  Richard Gillespie is a 67 y.o. male admitted on 09/12/2018 with pneumonia.  Pharmacy has been consulted for Vancomycin and Cefepime dosing.  Patient has mental retardation with 3 days of vomiting/diarrhea. +SBO  Plan: Patient received Cefepime 2 gram IV x 1 and Vancomycin 1 gram IV x 1 in ER. Will continue with stacked dosing of Vancomycin, 1250 mg IV q18h. Goal trough 15-20 mcg/ml Ke 0.054  t 1/2 12.8  Vd 43.8   Will order trough prior to 5th dose on 10/21 F/u MRSA PCR  -Cefepime 2 gram IV q12h    Height: 5\' 11"  (180.3 cm) Weight: 137 lb 12.6 oz (62.5 kg) IBW/kg (Calculated) : 75.3  Temp (24hrs), Avg:98.1 F (36.7 C), Min:98 F (36.7 C), Max:98.2 F (36.8 C)  Recent Labs  Lab 09/12/18 1406  WBC 15.3*  CREATININE 1.08  LATICACIDVEN 1.1    Estimated Creatinine Clearance: 59.5 mL/min (by C-G formula based on SCr of 1.08 mg/dL).    Allergies  Allergen Reactions  . Zithromax [Azithromycin] Other (See Comments)    unknown    Antimicrobials this admission: Cefepime 10/18 >>   Vanc 10/18 >>    Dose adjustments this admission:    Microbiology results:  10/18 BCx: P   UCx:      Sputum:    10/18 MRSA PCR: P  Thank you for allowing pharmacy to be a part of this patient's care.  Miku Udall A 09/12/2018 8:54 PM

## 2018-09-12 NOTE — ED Provider Notes (Signed)
Patient became agitated with attempted placement NG tube, unable to be successfully placed.  Due to the patient's hypoxia on room air and associated pneumonia I am hesitant to sedate the patient at this time.  Discussed case with Dr. Maia Plan of general surgery, he recommends that NG tube placement can be reattempted at a later time.   Sharyn Creamer, MD 09/12/18 [phone]

## 2018-09-12 NOTE — Progress Notes (Signed)
CODE SEPSIS - PHARMACY COMMUNICATION  **Broad Spectrum Antibiotics should be administered within 1 hour of Sepsis diagnosis**  Time Code Sepsis Called/Page Received: 1800  Antibiotics Ordered: cefepime/vanc    Time of 1st antibiotic administration: 1836  Additional action taken by pharmacy:    If necessary, Name of Provider/Nurse Contacted:      Angelique Blonder ,PharmD Clinical Pharmacist  09/12/2018  7:43 PM

## 2018-09-12 NOTE — ED Notes (Signed)
Pt denies pain.

## 2018-09-12 NOTE — H&P (Signed)
[facility] -  at [facility]   PATIENT NAME: Richard Gillespie    MR#:  [id number]  DATE OF BIRTH:  03-01-1951  DATE OF ADMISSION:  09/12/2018  PRIMARY CARE PHYSICIAN: Gracelyn Nurse, MD   REQUESTING/REFERRING PHYSICIAN: Fanny Bien, MD  CHIEF COMPLAINT:   Chief Complaint  Patient presents with  . Decreased PO    HISTORY OF PRESENT ILLNESS:  Richard Gillespie  is a 67 y.o. male who presents with chief complaint as above.  Patient presents with caretakers with significant nausea and vomiting.  Patient has severe MR, and is unable to contribute any information to his history.  Here in the ED evaluation showed high-grade small bowel obstruction.  He also has right lower lobe pneumonia.  Surgery was contacted by ED physician and recommended NG tube.  They will follow along in the case, and hospitalist were called for admission  PAST MEDICAL HISTORY:   Past Medical History:  Diagnosis Date  . Mental retardation   . Seizures (HCC)      PAST SURGICAL HISTORY:   Past Surgical History:  Procedure Laterality Date  . COLON SURGERY       SOCIAL HISTORY:   Social History   Tobacco Use  . Smoking status: Never Smoker  . Smokeless tobacco: Never Used  Substance Use Topics  . Alcohol use: No     FAMILY HISTORY:  Family history reviewed and is non-contributory   DRUG ALLERGIES:   Allergies  Allergen Reactions  . Zithromax [Azithromycin] Other (See Comments)    unknown    MEDICATIONS AT HOME:   Prior to Admission medications   Medication Sig Start Date End Date Taking? Authorizing Provider  bisacodyl (DULCOLAX) 10 MG suppository Place 10 mg rectally as needed for moderate constipation.   Yes [provider]  carbamazepine (TEGRETOL XR) 100 MG 12 hr tablet Take 100 mg by mouth at bedtime.   Yes [provider]  carbamazepine (TEGRETOL XR) 200 MG 12 hr tablet Take 200 mg by mouth 4 (four) times daily.    Yes [provider]   cloNIDine HCl (KAPVAY) 0.1 MG TB12 ER tablet Take 0.1 mg by mouth daily.    Yes [provider]  cloNIDine HCl (KAPVAY) 0.1 MG TB12 ER tablet Take 0.2 mg by mouth at bedtime.   Yes [provider]  lactulose (CHRONULAC) 10 GM/15ML solution Take 10 g by mouth 2 (two) times daily as needed for mild constipation.    Yes [provider]  lamoTRIgine (LAMICTAL) 150 MG tablet Take 75 mg by mouth 2 (two) times daily.    Yes [provider]  loratadine (CLARITIN) 10 MG tablet Take 10 mg by mouth daily.   Yes [provider]  LORazepam (ATIVAN) 0.5 MG tablet Take 0.5 mg by mouth as needed for anxiety (max 4 tablets daily).    Yes [provider]  magnesium citrate SOLN Take 1 Bottle by mouth daily as needed for severe constipation (at least 2 days of no BM).    Yes [provider]  medroxyPROGESTERone (PROVERA) 10 MG tablet Take 10 mg by mouth daily.   Yes [provider]  Multiple Vitamin (THEREMS) TABS Take 1 tablet by mouth daily.   Yes [provider]  mupirocin nasal ointment (BACTROBAN) 2 % Place 1 application into the nose 2 (two) times daily. Use one-half of tube in each nostril twice daily for five (5) days. After application, press sides of nose together and gently massage.  Yes [provider]  polyethylene glycol (MIRALAX / GLYCOLAX) packet Take 17 g by mouth daily as needed for mild constipation or moderate constipation.    Yes [provider]  senna (SENOKOT) 8.6 MG TABS tablet Take 2 tablets by mouth 2 (two) times daily.    Yes [provider]  traZODone (DESYREL) 150 MG tablet Take 300 mg by mouth at bedtime.   Yes [provider]  feeding supplement, ENSURE ENLIVE, (ENSURE ENLIVE) LIQD Take 237 mLs by mouth 2 (two) times daily between meals. Patient not taking: Reported on 09/12/2018 01/16/17   Richard Caper, MD    REVIEW OF SYSTEMS:  Review of Systems  Unable to perform  ROS: Mental acuity     VITAL SIGNS:   Vitals:   09/12/18 1800 09/12/18 1839 09/12/18 1840 09/12/18 1849  BP:  (!) 142/82    Pulse: 69     Resp: (!) 21     Temp:   98.2 F (36.8 C)   TempSrc:   Oral   SpO2: 100%     Weight:      Height:    5\' 11"  (1.803 m)   Wt Readings from Last 3 Encounters:  09/12/18 62.5 kg  01/15/17 62.5 kg  01/11/17 68 kg    PHYSICAL EXAMINATION:  Physical Exam  Vitals reviewed. Constitutional: He appears well-developed and well-nourished. No distress.  HENT:  Head: Normocephalic and atraumatic.  Mouth/Throat: Oropharynx is clear and moist.  Eyes: Pupils are equal, round, and reactive to light. Conjunctivae and EOM are normal. No scleral icterus.  Neck: Normal range of motion. Neck supple. No JVD present. No thyromegaly present.  Cardiovascular: Normal rate, regular rhythm and intact distal pulses. Exam reveals no gallop and no friction rub.  No murmur heard. Respiratory: Effort normal and breath sounds normal. No respiratory distress. He has no wheezes. He has no rales.  GI: Soft. Bowel sounds are normal. He exhibits no distension. There is no tenderness.  Musculoskeletal: Normal range of motion. He exhibits no edema.  No arthritis, no gout  Lymphadenopathy:    He has no cervical adenopathy.  Neurological: He is alert. No cranial nerve deficit.  Unable to fully assess due to baseline mental acuity  Skin: Skin is warm and dry. No rash noted. No erythema.  Psychiatric:  Unable to assess due to baseline mental acuity    LABORATORY PANEL:   CBC Recent Labs  Lab 09/12/18 1406  WBC 15.3*  HGB 16.1  HCT 46.8  PLT 257   ------------------------------------------------------------------------------------------------------------------  Chemistries  Recent Labs  Lab 09/12/18 1406  NA 137  K 3.7  CL 96*  CO2 28  GLUCOSE 148*  BUN 27*  CREATININE 1.08  CALCIUM 9.7  AST 44*  ALT 31  ALKPHOS 64  BILITOT 0.9    ------------------------------------------------------------------------------------------------------------------  Cardiac Enzymes No results for input(s): TROPONINI in the last 168 hours. ------------------------------------------------------------------------------------------------------------------  RADIOLOGY:  Dg Chest 2 View  Result Date: 09/12/2018 CLINICAL DATA:  Hypoxia. EXAM: CHEST - 2 VIEW COMPARISON:  01/13/2017 FINDINGS: Patient is rotated to the right. Lungs are hypoinflated. Most of the cardiac silhouette is right of midline similar to the previous exam making interpretation somewhat difficult. No definite effusion. It would be difficult to exclude airspace process in the right base. Possible prominence of the perihilar markings. Curvature of the thoracic spine convex right unchanged. Remainder of the exam is unchanged. IMPRESSION: Hypoinflation with possible airspace process in the right base which may be due to atelectasis or  infection. Possible mild vascular congestion. Electronically Signed   By: Elberta Fortis M.D.   On: 09/12/2018 16:49   Ct Abdomen Pelvis W Contrast  Result Date: 09/12/2018 CLINICAL DATA:  History of bowel obstruction.  Nausea and vomiting. EXAM: CT ABDOMEN AND PELVIS WITH CONTRAST TECHNIQUE: Multidetector CT imaging of the abdomen and pelvis was performed using the standard protocol following bolus administration of intravenous contrast. CONTRAST:  ISOVUE-300 IOPAMIDOL (ISOVUE-300) INJECTION 61% COMPARISON:  Radiography same day FINDINGS: Lower chest: Mild atelectasis at the lung bases. Elevation of the hemidiaphragms. Hepatobiliary: No significant liver parenchymal finding. No calcified gallstones. Pancreas: Normal Spleen: Normal Adrenals/Urinary Tract: Adrenal glands are normal. Kidneys are normal except for a small cyst at the lower pole on the right. Bladder is normal. Stomach/Bowel: Stomach is distended with fluid. There is high-grade proximal  small bowel obstruction, probably within the mid jejunum. The small bowel is collapsed distal to that. Some persistent fecal matter in the colon. Vascular/Lymphatic: Minimal atherosclerotic change of the aorta. IVC is normal. No retroperitoneal adenopathy. Reproductive: Normal Other: Small amount of free fluid in the pelvis. Musculoskeletal: Motion degraded.  No abnormality seen. IMPRESSION: High-grade small bowel obstruction, mid jejunal. No free air. Small amount of free fluid. Electronically Signed   By: Paulina Fusi M.D.   On: 09/12/2018 17:29   Dg Abd 2 Views  Result Date: 09/12/2018 CLINICAL DATA:  67 year old male with a history of nausea vomiting and diarrhea EXAM: ABDOMEN - 2 VIEW COMPARISON:  No plain film abdomen available for comparison. FINDINGS: Gaseous distention of colon with air-fluid levels of the left and right:. The configuration of colon in the left abdomen is similar to prior chest x-ray dated 01/13/2017. Formed stool in the right colon with small volume formed stool in the rectum. Gas extends to the rectum. Relative paucity of small bowel gas and gastric air. Surgical clips in the mid abdomen.  No acute displaced fracture. IMPRESSION: Plain film demonstrates colonic distention with multiple air-fluid levels, in a configuration that was present on a prior chest chest x-ray. Findings may represent chronic changes, however, a fixed obstruction cannot be excluded, and if there is concern for acute intra-abdominal process, contrast-enhanced CT may be useful. Electronically Signed   By: Gilmer Mor D.O.   On: 09/12/2018 14:35    EKG:  No orders found for this or any previous visit.  IMPRESSION AND PLAN:  Principal Problem:   Sepsis (HCC) -due to aspiration pneumonia from his profound vomiting.  IV antibiotics initiated, lactic acid was within normal limits.  Blood pressure stable.  Cultures sent Active Problems:   SBO (small bowel obstruction) (HCC) -patient did not tolerate  placement of NG tube.  Given his borderline hypoxia from the pneumonia he was felt to be unwise to sedate him in order to place an NG tube at this time.  We will treat him conservatively, treat his pneumonia, keep him n.p.o. with bowel rest and monitor.  NG tube can be placed at a later time if still felt necessary.  Surgery following   Aspiration pneumonia (HCC) -antibiotics as above, supportive treatment PRN   Seizure disorder (HCC) -home dose antiepileptics  Chart review performed and case discussed with ED provider. Labs, imaging and/or ECG reviewed by provider and discussed with patient/family. Management plans discussed with the patient and/or family.  DVT PROPHYLAXIS: SubQ lovenox   GI PROPHYLAXIS:  None  ADMISSION STATUS: Inpatient     CODE STATUS: Full Code Status History    Date Active  Date Inactive Code Status Order ID Comments User Context   01/12/2017 1647 01/15/2017 1928 Full Code [id number]  Katha Hamming, MD ED      TOTAL TIME TAKING CARE OF THIS PATIENT: 45 minutes.   Kattaleya Alia FIELDING 09/12/2018, 9:37 PM  [facility]  Office  [phone]  CC: Primary care physician; Gracelyn Nurse, MD  Note:  This document was prepared using Dragon voice recognition software and may include unintentional dictation errors.

## 2018-09-12 NOTE — ED Provider Notes (Signed)
[facility] Emergency Department Provider Note   ____________________________________________   First MD Initiated Contact with Patient 09/12/18 1606     (approximate)  I have reviewed the triage vital signs and the nursing notes.   HISTORY  Chief Complaint Decreased PO    HPI Richard Gillespie is a 67 y.o. male presents for evaluation.  Patient has a known history of mental retardation.  EM caveat: Patient decreased cognitive status making history and review of systems less reliable.  Patient's caretakers are at the bedside provide majority of history  Patient presents for evaluation of 3 days of vomiting and occasional loose stools.  Caretaker reports she is seen similar symptoms for him when he is developed bowel obstructions.  Saw his primary care doctor today and wanted him to come to get x-rays to make sure he was not obstructed.  Patient himself denies pain.  Also noted to be hypoxic on arrival with saturations in the mid 80s.  Caretakers report he is been vomiting but have not noticed any trouble breathing.  Patient denies feeling short of breath, and also denies abdominal pain and his caretakers report he would only report pain or symptoms if they were very severe, the report is not reliable with regard to history.  No black or bloody stools noted.  Caretakers report he has baseline very pale complexion   Past Medical History:  Diagnosis Date  . Mental retardation   . Seizures [facility])     Patient Active Problem List   Diagnosis Date Noted  . E coli infection 01/15/2017  . Agitation 01/15/2017  . Diarrhea 01/15/2017  . Hypokalemia 01/15/2017  . Hyponatremia 01/15/2017  . Leukocytosis 01/15/2017  . Hyperglycemia 01/15/2017  . UTI (urinary tract infection) 01/12/2017    Past Surgical History:  Procedure Laterality Date  . COLON SURGERY      Prior to Admission medications   Medication Sig Start Date End Date Taking? Authorizing Provider    bisacodyl (DULCOLAX) 10 MG suppository Place 10 mg rectally as needed for moderate constipation.    [provider]  carbamazepine (TEGRETOL XR) 100 MG 12 hr tablet Take 100 mg by mouth at bedtime.    [provider]  carbamazepine (TEGRETOL XR) 200 MG 12 hr tablet Take 200 mg by mouth 4 (four) times daily. 0700,1200,1600,1900    [provider]  cephALEXin (KEFLEX) 250 MG/5ML suspension Take 10 mLs (500 mg total) by mouth every 8 (eight) hours. 01/15/17   Katharina Caper, MD  cloNIDine HCl (KAPVAY) 0.1 MG TB12 ER tablet Take by mouth daily.    [provider]  cloNIDine HCl (KAPVAY) 0.1 MG TB12 ER tablet Take 0.2 mg by mouth at bedtime.    [provider]  feeding supplement, ENSURE ENLIVE, (ENSURE ENLIVE) LIQD Take 237 mLs by mouth 2 (two) times daily between meals. 01/16/17   Katharina Caper, MD  lactulose (CHRONULAC) 10 GM/15ML solution Take 20 g by mouth 2 (two) times daily as needed for mild constipation.    [provider]  lamoTRIgine (LAMICTAL) 25 MG tablet Take 50 mg by mouth 2 (two) times daily.    [provider]  loratadine (CLARITIN) 10 MG tablet Take 10 mg by mouth daily.    [provider]  LORazepam (ATIVAN) 0.5 MG tablet Take 0.5 mg by mouth as needed for anxiety.    [provider]  magnesium citrate SOLN 1 Bottle. "give 10 ounces by mouth every day as needed for NO bowel  movement in 2 days"    [provider]  medroxyPROGESTERone (PROVERA) 10 MG tablet Take 10 mg by mouth daily.    [provider]  Multiple Vitamin (THEREMS) TABS Take 1 tablet by mouth daily.    [provider]  mupirocin nasal ointment (BACTROBAN) 2 % Place 1 application into the nose 2 (two) times daily. Use one-half of tube in each nostril twice daily for five (5) days. After application, press sides of nose together and gently massage.    [provider]  polyethylene glycol (MIRALAX / GLYCOLAX)  packet Take 17 g by mouth daily as needed.    [provider]  senna (SENOKOT) 8.6 MG TABS tablet Take 1 tablet by mouth 2 (two) times daily.    [provider]  traZODone (DESYREL) 150 MG tablet Take 300 mg by mouth at bedtime.    [provider]    Allergies Zithromax [azithromycin]  History reviewed. No pertinent family history.  Social History Social History   Tobacco Use  . Smoking status: Never Smoker  . Smokeless tobacco: Never Used  Substance Use Topics  . Alcohol use: No  . Drug use: Never    Review of Systems EM caveat   ____________________________________________   PHYSICAL EXAM:  VITAL SIGNS: ED Triage Vitals  Enc Vitals Group     BP 09/12/18 1349 (!) 150/104     Pulse Rate 09/12/18 1349 77     Resp 09/12/18 1349 20     Temp 09/12/18 1349 98 F (36.7 C)     Temp Source 09/12/18 1349 Oral     SpO2 09/12/18 1349 94 %     Weight 09/12/18 1354 137 lb 12.6 oz (62.5 kg)     Height --      Head Circumference --      Peak Flow --      Pain Score --      Pain Loc --      Pain Edu? --      Excl. in GC? --     Constitutional: Alert and oriented to self, but not to place or date. Well appearing and in no acute distress.  Does not appear in acute pain or distress.  He is chronically ill-appearing.  Left upper extremity held in chronic flexion contracture. Eyes: Conjunctivae are normal. Head: Atraumatic. Nose: No congestion/rhinnorhea. Mouth/Throat: Mucous membranes are moist. Neck: No stridor.  Cardiovascular: Normal rate, regular rhythm. Grossly normal heart sounds.  Good peripheral circulation. Respiratory: Normal respiratory effort.  No retractions. Lungs CTAB.  Somewhat diminished lung sounds bases bilaterally, but no evidence of respiratory distress or wheezing is noted. Gastrointestinal: Soft and nontender no evidence of old abdominal surgery with midline abdominal incision and scarring clean dry and intact, old.  No overt  hernias.  Palpation the abdomen does not obviously exhibit painful symptoms for the patient.  No distention. Musculoskeletal: No lower extremity tenderness nor edema.  Left upper extremity flexion contracture.  Some muscle atrophy in all extremities as noted. Neurologic: Speech is name, yes no answers.  No gross focal neurologic deficits are appreciated except for chronic weakness left upper extremity.  Skin:  Skin is warm, dry and intact. No rash noted.  Somewhat pale complexion. Psychiatric: Mood and affect are calm and pleasant. ____________________________________________   LABS (all labs ordered are listed, but only abnormal results are displayed)  Labs Reviewed  COMPREHENSIVE METABOLIC PANEL - Abnormal; Notable for the following components:      Result Value  Chloride 96 (*)    Glucose, Bld 148 (*)    BUN 27 (*)    Total Protein 8.5 (*)    Albumin 5.1 (*)    AST 44 (*)    All other components within normal limits  CBC - Abnormal; Notable for the following components:   WBC 15.3 (*)    All other components within normal limits  CULTURE, BLOOD (ROUTINE X 2)  CULTURE, BLOOD (ROUTINE X 2)  MRSA PCR SCREENING  LIPASE, BLOOD  LACTIC ACID, PLASMA  URINALYSIS, COMPLETE (UACMP) WITH MICROSCOPIC  LACTIC ACID, PLASMA   ____________________________________________  EKG   ____________________________________________  RADIOLOGY  Dg Chest 2 View  Result Date: 09/12/2018 CLINICAL DATA:  Hypoxia. EXAM: CHEST - 2 VIEW COMPARISON:  01/13/2017 FINDINGS: Patient is rotated to the right. Lungs are hypoinflated. Most of the cardiac silhouette is right of midline similar to the previous exam making interpretation somewhat difficult. No definite effusion. It would be difficult to exclude airspace process in the right base. Possible prominence of the perihilar markings. Curvature of the thoracic spine convex right unchanged. Remainder of the exam is unchanged. IMPRESSION: Hypoinflation with  possible airspace process in the right base which may be due to atelectasis or infection. Possible mild vascular congestion. Electronically Signed   By: Elberta Fortis M.D.   On: 09/12/2018 16:49   Ct Abdomen Pelvis W Contrast  Result Date: 09/12/2018 CLINICAL DATA:  History of bowel obstruction.  Nausea and vomiting. EXAM: CT ABDOMEN AND PELVIS WITH CONTRAST TECHNIQUE: Multidetector CT imaging of the abdomen and pelvis was performed using the standard protocol following bolus administration of intravenous contrast. CONTRAST:  ISOVUE-300 IOPAMIDOL (ISOVUE-300) INJECTION 61% COMPARISON:  Radiography same day FINDINGS: Lower chest: Mild atelectasis at the lung bases. Elevation of the hemidiaphragms. Hepatobiliary: No significant liver parenchymal finding. No calcified gallstones. Pancreas: Normal Spleen: Normal Adrenals/Urinary Tract: Adrenal glands are normal. Kidneys are normal except for a small cyst at the lower pole on the right. Bladder is normal. Stomach/Bowel: Stomach is distended with fluid. There is high-grade proximal small bowel obstruction, probably within the mid jejunum. The small bowel is collapsed distal to that. Some persistent fecal matter in the colon. Vascular/Lymphatic: Minimal atherosclerotic change of the aorta. IVC is normal. No retroperitoneal adenopathy. Reproductive: Normal Other: Small amount of free fluid in the pelvis. Musculoskeletal: Motion degraded.  No abnormality seen. IMPRESSION: High-grade small bowel obstruction, mid jejunal. No free air. Small amount of free fluid. Electronically Signed   By: Paulina Fusi M.D.   On: 09/12/2018 17:29   Dg Abd 2 Views  Result Date: 09/12/2018 CLINICAL DATA:  67 year old male with a history of nausea vomiting and diarrhea EXAM: ABDOMEN - 2 VIEW COMPARISON:  No plain film abdomen available for comparison. FINDINGS: Gaseous distention of colon with air-fluid levels of the left and right:. The configuration of colon in the left abdomen  is similar to prior chest x-ray dated 01/13/2017. Formed stool in the right colon with small volume formed stool in the rectum. Gas extends to the rectum. Relative paucity of small bowel gas and gastric air. Surgical clips in the mid abdomen.  No acute displaced fracture. IMPRESSION: Plain film demonstrates colonic distention with multiple air-fluid levels, in a configuration that was present on a prior chest chest x-ray. Findings may represent chronic changes, however, a fixed obstruction cannot be excluded, and if there is concern for acute intra-abdominal process, contrast-enhanced CT may be useful. Electronically Signed   By: Gilmer Mor D.O.  On: 09/12/2018 14:35      Chest x-ray were personally reviewed by me, notable opacity involving the right lower lobe additionally, CT with high-grade small bowel obstruction discussed with Dr. Maia Plan. ____________________________________________   PROCEDURES  Procedure(s) performed: None  Procedures  Critical Care performed: Yes, see critical care note(s)  CRITICAL CARE Performed by: Sharyn Creamer   Total critical care time: 35 minutes  Critical care time was exclusive of separately billable procedures and treating other patients.  Critical care was necessary to treat or prevent imminent or life-threatening deterioration.  Critical care was time spent personally by me on the following activities: development of treatment plan with patient and/or surrogate as well as nursing, discussions with consultants, evaluation of patient's response to treatment, examination of patient, obtaining history from patient or surrogate, ordering and performing treatments and interventions, ordering and review of laboratory studies, ordering and review of radiographic studies, pulse oximetry and re-evaluation of patient's condition.  ____________________________________________   INITIAL IMPRESSION / ASSESSMENT AND PLAN / ED COURSE  Pertinent labs & imaging  results that were available during my care of the patient were reviewed by me and considered in my medical decision making (see chart for details).     Clinical Course as of Sep 12 1757  Fri Sep 12, 2018  1624 Paged Hoy Morn PCP to discuss his visit with him today.    [MQ]  1627 D/W McLoughlin. Sent for futher evaluation for hiccps, poor oral, concern for vomiting and possible bowel onstructionat the clinic.   O2 sat 92% at clinic without notation of resp. issue   [MQ]    Clinical Course User Index [MQ] Sharyn Creamer, MD   ----------------------------------------- 5:57 PM on 09/12/2018 -----------------------------------------  Patient started on antibiotics for what I suspect is likely pneumonia involving the right lower lobe given his hypoxia.  Additionally CT scan demonstrates small bowel obstruction.  He has been seen and evaluated by Dr. Maia Plan of general surgery, requesting the patient be admitted to medicine due to complexity of care involving his hypoxia as well as pneumonia with general surgery following.  Dr. Maia Plan currently recommending nasogastric tube placement and that surgical service will follow closely.  Patient appears comfortable at present time, resting with stable hemodynamics.  Currently on 2 L nasal cannula with good oxygen saturation.  Antibiotics ordered.  Code sepsis initiated, patient not hypotensive or with elevated lactic acid however suspect possible sepsis given leukocytosis ____________________________________________   FINAL CLINICAL IMPRESSION(S) / ED DIAGNOSES  Final diagnoses:  SBO (small bowel obstruction) (HCC)  Pneumonia of right lower lobe due to infectious organism (HCC)  Sepsis, due to unspecified organism, unspecified whether acute organ dysfunction present [facility])        Note:  This document was prepared using Dragon voice recognition software and may include unintentional dictation errors       Sharyn Creamer, MD 09/12/18  1759

## 2018-09-12 NOTE — ED Triage Notes (Signed)
Pt to ED with caregivers sent by PCP Dr. Letitia Libra stating patient has had decreased PO starting yesterday afternoon.  Hx of SBO and PCP requesting xray.

## 2018-09-12 NOTE — ED Notes (Signed)
Brittany RN, aware of bed assigned  

## 2018-09-12 NOTE — ED Notes (Signed)
Pt resisting NG tube. Quale MD notified. Ordered to hold NG tube for now.

## 2018-09-12 NOTE — Consult Note (Signed)
SURGICAL CONSULTATION NOTE   HISTORY OF PRESENT ILLNESS (HPI):  67 y.o. male presented to [facility] ED for evaluation of nausea and vomiting with decrease appetitte. Patient with severe mental retardation and all history taken from care giver. Care giver refers that she first start feeling that he was not in his usual state since 2 days ago. Started with decreased appetite and today started vomiting. Patient refers patient has been passing flatus and last bowel movement today. Unable to assess pain, pain radiation or alleviator or aggravator factors.  Denies fever or chills.  Patient has history of small bowel obstruction and surgery at [facility] in the past.   Surgery is consulted by Dr. Fanny Bien in this context for evaluation and management of bowel obstruction.  PAST MEDICAL HISTORY (PMH):  Past Medical History:  Diagnosis Date  . Mental retardation   . Seizures (HCC)      PAST SURGICAL HISTORY (PSH):  Past Surgical History:  Procedure Laterality Date  . COLON SURGERY       MEDICATIONS:  Prior to Admission medications   Medication Sig Start Date End Date Taking? Authorizing Provider  bisacodyl (DULCOLAX) 10 MG suppository Place 10 mg rectally as needed for moderate constipation.   Yes [provider]  carbamazepine (TEGRETOL XR) 100 MG 12 hr tablet Take 100 mg by mouth at bedtime.   Yes [provider]  carbamazepine (TEGRETOL XR) 200 MG 12 hr tablet Take 200 mg by mouth 4 (four) times daily.    Yes [provider]  cloNIDine HCl (KAPVAY) 0.1 MG TB12 ER tablet Take 0.1 mg by mouth daily.    Yes [provider]  cloNIDine HCl (KAPVAY) 0.1 MG TB12 ER tablet Take 0.2 mg by mouth at bedtime.   Yes [provider]  lactulose (CHRONULAC) 10 GM/15ML solution Take 10 g by mouth 2 (two) times daily as needed for mild constipation.    Yes [provider]  lamoTRIgine (LAMICTAL) 150 MG tablet Take 75 mg by mouth 2 (two) times daily.    Yes [provider]  loratadine (CLARITIN) 10 MG tablet Take 10 mg by mouth daily.   Yes [provider]  LORazepam (ATIVAN) 0.5 MG tablet Take 0.5 mg by mouth as needed for anxiety (max 4 tablets daily).    Yes [provider]  magnesium citrate SOLN Take 1 Bottle by mouth daily as needed for severe constipation (at least 2 days of no BM).    Yes [provider]  medroxyPROGESTERone (PROVERA) 10 MG tablet Take 10 mg by mouth daily.   Yes [provider]  Multiple Vitamin (THEREMS) TABS Take 1 tablet by mouth daily.   Yes [provider]  mupirocin nasal ointment (BACTROBAN) 2 % Place 1 application into the nose 2 (two) times daily. Use one-half of tube in each nostril twice daily for five (5) days. After application, press sides of nose together and gently massage.   Yes [provider]  polyethylene glycol (MIRALAX / GLYCOLAX) packet Take 17 g by mouth daily as needed for mild constipation or moderate constipation.    Yes [provider]  senna (SENOKOT) 8.6 MG TABS tablet Take 2 tablets by mouth 2 (two) times daily.    Yes [provider]  traZODone (DESYREL) 150 MG tablet Take 300 mg by mouth at bedtime.   Yes [provider]  feeding supplement, ENSURE ENLIVE, (ENSURE ENLIVE) LIQD Take 237 mLs by mouth 2 (two) times daily between meals. Patient not  taking: Reported on 09/12/2018 01/16/17   Katharina Caper, MD     ALLERGIES:  Allergies  Allergen Reactions  . Zithromax [Azithromycin] Other (See Comments)    unknown     SOCIAL HISTORY:  Social History   Socioeconomic History  . Marital status: Single    Spouse name: Not on file  . Number of children: Not on file  . Years of education: Not on file  . Highest education level: Not on file  Occupational History  . Not on file  Social Needs  . Financial resource strain: Not on file  . Food insecurity:    Worry: Not on file    Inability: Not on file  .  Transportation needs:    Medical: Not on file    Non-medical: Not on file  Tobacco Use  . Smoking status: Never Smoker  . Smokeless tobacco: Never Used  Substance and Sexual Activity  . Alcohol use: No  . Drug use: Never  . Sexual activity: Not on file  Lifestyle  . Physical activity:    Days per week: Not on file    Minutes per session: Not on file  . Stress: Not on file  Relationships  . Social connections:    Talks on phone: Not on file    Gets together: Not on file    Attends religious service: Not on file    Active member of club or organization: Not on file    Attends meetings of clubs or organizations: Not on file    Relationship status: Not on file  . Intimate partner violence:    Fear of current or ex partner: Not on file    Emotionally abused: Not on file    Physically abused: Not on file    Forced sexual activity: Not on file  Other Topics Concern  . Not on file  Social History Narrative  . Not on file     FAMILY HISTORY:  History reviewed. No pertinent family history.   REVIEW OF SYSTEMS: review of system asked to care giver Constitutional: denies weight loss, fever, chills, or sweats  Eyes: denies any other vision changes, history of eye injury  ENT: denies sore throat, hearing problems  Respiratory: denies shortness of breath, wheezing  Cardiovascular: denies chest pain, palpitations  Gastrointestinal: denies abdominal pain, Positive for Nausea and vomiting. Genitourinary: denies burning with urination or urinary frequency Musculoskeletal: denies any other joint pains or cramps  Skin: denies any other rashes or skin discolorations  Neurological: denies any other headache, dizziness, Positive for weakness  Psychiatric: denies any other depression, anxiety   All other review of systems were negative   VITAL SIGNS:  Temp:  [98 F (36.7 C)-98.2 F (36.8 C)] 98.2 F (36.8 C) (10/18 1840) Pulse Rate:  [69-90] 69 (10/18 1800) Resp:  [17-30] 21 (10/18  1800) BP: (142-151)/(82-104) 142/82 (10/18 1839) SpO2:  [82 %-100 %] 100 % (10/18 1800) Weight:  [62.5 kg] 62.5 kg (10/18 1354)       Weight: 62.5 kg      PHYSICAL EXAM:  Constitutional:  -- Normal body habitus  -- Awake, alert, mild distress most likely due to pain and not being in his environment  Eyes:  -- Pupils equally round and reactive to light  -- No scleral icterus  Ear, nose, and throat:  -- No jugular venous distension  Pulmonary:  -- No crackles  -- Equal breath sounds bilaterally -- Breathing non-labored at rest Cardiovascular:  -- S1, S2 present  --  No pericardial rubs Gastrointestinal:  -- Abdomen soft, nontender, distended and tympanic, no guarding or rebound tenderness -- No abdominal masses appreciated, pulsatile or otherwise  Musculoskeletal and Integumentary:  -- Wounds or skin discoloration: None appreciated -- Extremities: B/L UE and LE FROM, hands and feet warm, no edema  Neurologic:  -- Motor function: contracted left upper extremity with strength 5/5.  -- Sensation: intact and symmetric   Labs:  CBC Latest Ref Rng & Units 09/12/2018 01/13/2017 01/12/2017  WBC 4.0 - 10.5 K/uL 15.3(H) 8.3 11.7(H)  Hemoglobin 13.0 - 17.0 g/dL 16.1 09.6 04.5  Hematocrit 39.0 - 52.0 % 46.8 39.2(L) 39.4(L)  Platelets 150 - 400 K/uL 257 122(L) 139(L)   CMP Latest Ref Rng & Units 09/12/2018 01/15/2017 01/13/2017  Glucose 70 - 99 mg/dL 409(W) 96 119(J)  BUN 8 - 23 mg/dL 47(W) 9 18  Creatinine 0.61 - 1.24 mg/dL 2.95 6.21 3.08  Sodium 135 - 145 mmol/L 137 139 138  Potassium 3.5 - 5.1 mmol/L 3.7 3.1(L) 3.2(L)  Chloride 98 - 111 mmol/L 96(L) 104 107  CO2 22 - 32 mmol/L 28 26 22   Calcium 8.9 - 10.3 mg/dL 9.7 6.5(H) 8.4(O)  Total Protein 6.5 - 8.1 g/dL 9.6(E) - -  Total Bilirubin 0.3 - 1.2 mg/dL 0.9 - -  Alkaline Phos 38 - 126 U/L 64 - -  AST 15 - 41 U/L 44(H) - -  ALT 0 - 44 U/L 31 - -     Imaging studies:  EXAM: CT ABDOMEN AND PELVIS WITH  CONTRAST  TECHNIQUE: Multidetector CT imaging of the abdomen and pelvis was performed using the standard protocol following bolus administration of intravenous contrast.  CONTRAST:  ISOVUE-300 IOPAMIDOL (ISOVUE-300) INJECTION 61%  COMPARISON:  Radiography same day  FINDINGS: Lower chest: Mild atelectasis at the lung bases. Elevation of the hemidiaphragms.  Hepatobiliary: No significant liver parenchymal finding. No calcified gallstones.  Pancreas: Normal  Spleen: Normal  Adrenals/Urinary Tract: Adrenal glands are normal. Kidneys are normal except for a small cyst at the lower pole on the right. Bladder is normal.  Stomach/Bowel: Stomach is distended with fluid. There is high-grade proximal small bowel obstruction, probably within the mid jejunum. The small bowel is collapsed distal to that. Some persistent fecal matter in the colon.  Vascular/Lymphatic: Minimal atherosclerotic change of the aorta. IVC is normal. No retroperitoneal adenopathy.  Reproductive: Normal  Other: Small amount of free fluid in the pelvis.  Musculoskeletal: Motion degraded.  No abnormality seen.  IMPRESSION: High-grade small bowel obstruction, mid jejunal. No free air. Small amount of free fluid.   Electronically Signed   By: Paulina Fusi M.D.   On: 09/12/2018 17:29  Assessment/Plan:  67 y.o. male with small bowel obstruction, complicated by pertinent comorbidities including pneumonia, severe mental retardation and seizure. Patient with past episode of bowel obstruction that required surgery now with new episodes of high grade bowel obstruction. The vital signs, physical exam and labs are not worrisome of bowel compromise or acute abdomen. There is no free air on CT scan. No acute surgical indication. Patient needs NGT decompression. Oriented ER staff to retry to place the NGT as part of the treatment of the bowel obstruction.  Patient also with signs of pneumonia.  Internal medicine consulted for admission and management of pneumonia. I will follow patient and manage her bowel obstruction.  I oriented the care giver about the plan and she understood.   Gae Gallop, MD

## 2018-09-13 ENCOUNTER — Inpatient Hospital Stay: Payer: Medicare Other

## 2018-09-13 LAB — URINALYSIS, COMPLETE (UACMP) WITH MICROSCOPIC
Glucose, UA: NEGATIVE mg/dL
KETONES UR: 15 mg/dL — AB
Leukocytes, UA: NEGATIVE
Nitrite: POSITIVE — AB
Protein, ur: >300 mg/dL — AB
Specific Gravity, Urine: >1.03 — ABNORMAL HIGH (ref 1.005–1.030)
pH: 6 (ref 5.0–8.0)

## 2018-09-13 LAB — MRSA PCR SCREENING: MRSA BY PCR: NEGATIVE

## 2018-09-13 LAB — BASIC METABOLIC PANEL
ANION GAP: 15 (ref 5–15)
BUN: 31 mg/dL — ABNORMAL HIGH (ref 8–23)
CALCIUM: 9.5 mg/dL (ref 8.9–10.3)
CO2: 30 mmol/L (ref 22–32)
Chloride: 95 mmol/L — ABNORMAL LOW (ref 98–111)
Creatinine, Ser: 0.95 mg/dL (ref 0.61–1.24)
Glucose, Bld: 146 mg/dL — ABNORMAL HIGH (ref 70–99)
Potassium: 3.5 mmol/L (ref 3.5–5.1)
Sodium: 140 mmol/L (ref 135–145)

## 2018-09-13 LAB — CBC
HCT: 47.2 % (ref 39.0–52.0)
Hemoglobin: 16 g/dL (ref 13.0–17.0)
MCH: 29.6 pg (ref 26.0–34.0)
MCHC: 33.9 g/dL (ref 30.0–36.0)
MCV: 87.4 fL (ref 80.0–100.0)
NRBC: 0 % (ref 0.0–0.2)
PLATELETS: 250 10*3/uL (ref 150–400)
RBC: 5.4 MIL/uL (ref 4.22–5.81)
RDW: 12.3 % (ref 11.5–15.5)
WBC: 17 10*3/uL — ABNORMAL HIGH (ref 4.0–10.5)

## 2018-09-13 MED ORDER — DIATRIZOATE MEGLUMINE & SODIUM 66-10 % PO SOLN
90.0000 mL | Freq: Once | ORAL | Status: AC
  Administered 2018-09-13: 90 mL via NASOGASTRIC

## 2018-09-13 MED ORDER — LEVETIRACETAM IN NACL 500 MG/100ML IV SOLN
500.0000 mg | Freq: Two times a day (BID) | INTRAVENOUS | Status: DC
  Filled 2018-09-13 (×2): qty 100

## 2018-09-13 MED ORDER — SODIUM CHLORIDE 0.9 % IV SOLN
500.0000 mg | Freq: Two times a day (BID) | INTRAVENOUS | Status: DC
  Administered 2018-09-13 – 2018-09-20 (×15): 500 mg via INTRAVENOUS
  Filled 2018-09-13 (×4): qty 525
  Filled 2018-09-13 (×2): qty 5
  Filled 2018-09-13: qty 525
  Filled 2018-09-13: qty 5
  Filled 2018-09-13 (×9): qty 525

## 2018-09-13 MED ORDER — LORAZEPAM 2 MG/ML IJ SOLN
0.5000 mg | Freq: Four times a day (QID) | INTRAMUSCULAR | Status: DC | PRN
  Administered 2018-09-14 – 2018-09-15 (×3): 0.5 mg via INTRAVENOUS
  Administered 2018-09-15: 09:00:00 via INTRAVENOUS
  Administered 2018-09-15 – 2018-09-20 (×6): 0.5 mg via INTRAVENOUS
  Filled 2018-09-13 (×11): qty 1

## 2018-09-13 MED ORDER — SODIUM CHLORIDE 0.9 % IV SOLN
3.0000 g | Freq: Four times a day (QID) | INTRAVENOUS | Status: DC
  Administered 2018-09-13 – 2018-09-14 (×4): 3 g via INTRAVENOUS
  Filled 2018-09-13 (×8): qty 3

## 2018-09-13 MED ORDER — SODIUM CHLORIDE 0.9 % IV SOLN
INTRAVENOUS | Status: AC
  Administered 2018-09-13 – 2018-09-14 (×2): via INTRAVENOUS

## 2018-09-13 MED ORDER — METOCLOPRAMIDE HCL 5 MG/ML IJ SOLN
5.0000 mg | Freq: Four times a day (QID) | INTRAMUSCULAR | Status: DC | PRN
  Administered 2018-09-13: 5 mg via INTRAVENOUS
  Filled 2018-09-13: qty 2

## 2018-09-13 MED ORDER — LORAZEPAM 2 MG/ML IJ SOLN
1.0000 mg | Freq: Once | INTRAMUSCULAR | Status: AC
  Administered 2018-09-13: 1 mg via INTRAVENOUS
  Filled 2018-09-13: qty 1

## 2018-09-13 NOTE — Progress Notes (Signed)
SURGICAL PROGRESS NOTE   Hospital Day(s): 1.   Post op day(s):  Marland Kitchen   Interval History: Patient seen and examined. Patient with severe mental retardation unable to give history. No NGT placement trial yesterday by nursing staff. Patient continue vomiting.   Vital signs in last 24 hours: [min-max] current  Temp:  [97.5 F (36.4 C)-98.6 F (37 C)] 98.6 F (37 C) (10/19 1204) Pulse Rate:  [67-90] 70 (10/19 1204) Resp:  [16-22] 16 (10/19 1204) BP: (108-142)/(65-118) 108/65 (10/19 1204) SpO2:  [91 %-100 %] 91 % (10/19 1204)     Height: 5\' 11"  (180.3 cm) Weight: 62.5 kg      Physical Exam:  Constitutional: alert, cooperative and no distress  Gastrointestinal: soft, non-tender. Distended and tympanic.   Labs:  CBC Latest Ref Rng & Units 09/13/2018 09/12/2018 01/13/2017  WBC 4.0 - 10.5 K/uL 17.0(H) 15.3(H) 8.3  Hemoglobin 13.0 - 17.0 g/dL 54.0 98.1 19.1  Hematocrit 39.0 - 52.0 % 47.2 46.8 39.2(L)  Platelets 150 - 400 K/uL 250 257 122(L)   CMP Latest Ref Rng & Units 09/13/2018 09/12/2018 01/15/2017  Glucose 70 - 99 mg/dL 478(G) 956(O) 96  BUN 8 - 23 mg/dL 13(Y) 86(V) 9  Creatinine 0.61 - 1.24 mg/dL 7.84 6.96 2.95  Sodium 135 - 145 mmol/L 140 137 139  Potassium 3.5 - 5.1 mmol/L 3.5 3.7 3.1(L)  Chloride 98 - 111 mmol/L 95(L) 96(L) 104  CO2 22 - 32 mmol/L 30 28 26   Calcium 8.9 - 10.3 mg/dL 9.5 9.7 2.8(U)  Total Protein 6.5 - 8.1 g/dL - 8.5(H) -  Total Bilirubin 0.3 - 1.2 mg/dL - 0.9 -  Alkaline Phos 38 - 126 U/L - 64 -  AST 15 - 41 U/L - 44(H) -  ALT 0 - 44 U/L - 31 -    Imaging studies: No new pertinent imaging studies   Assessment/Plan:  67 y.o. male with high grade small bowel obstruction and pneumonia.   I placed NGT today. Will order gastrografin challenge for assessment of small bowel obstruction progression. This will be of benefit since patient is not able to communicate. Electrolytes are adequate. There was increase in WBC that will need to be followed closely.   Agree with medical management of pneumonia and seizure disorder.   Gae Gallop, MD

## 2018-09-13 NOTE — Progress Notes (Addendum)
Radiology was notified that oral contrast was given at 17:15. NG tube was clamped off for an hour and reconnected to LIS per SB protocol.They are aware that they have to do the 8 hour delay abdominal x-ray per SB protocol. Nigh shift RN aware as well.

## 2018-09-13 NOTE — Progress Notes (Addendum)
[facility] [place] [facility]      PATIENT NAME: Richard Gillespie    MR#:  [id number]  DATE OF BIRTH:  Jan 30, 1951  SUBJECTIVE:   Pt. Admitted from a group home due to recurrent N/V.  Patient's CT abdomen pelvis was suggestive of partial small bowel obstruction.  Patient has severe MR therefore is difficult to communicate with. NG tube placed with nursing staff.   REVIEW OF SYSTEMS:    Review of Systems  Unable to perform ROS: Mental acuity    Nutrition: NPO Tolerating Diet: No Tolerating PT: Await Eval.   DRUG ALLERGIES:   Allergies  Allergen Reactions  . Zithromax [Azithromycin] Other (See Comments)    unknown    VITALS:  Blood pressure 108/65, pulse 70, temperature 98.6 F (37 C), temperature source Oral, resp. rate 16, height 5\' 11"  (1.803 m), weight 62.5 kg, SpO2 91 %.  PHYSICAL EXAMINATION:   Physical Exam  GENERAL:  67 y.o.-year-old patient lying in bed in no acute distress.  EYES: Pupils equal, round, reactive to light and accommodation. No scleral icterus. Extraocular muscles intact.  HEENT: Head atraumatic, normocephalic. Oropharynx and nasopharynx clear.  NECK:  Supple, no jugular venous distention. No thyroid enlargement, no tenderness.  LUNGS: Normal breath sounds bilaterally, no wheezing, rales, rhonchi. No use of accessory muscles of respiration.  CARDIOVASCULAR: S1, S2 normal. No murmurs, rubs, or gallops.  ABDOMEN: Soft, nontender, Slightly distended. Bowel sounds Hypoactive. + mid-abdomen scar from previous surgery.  No organomegaly or mass.  EXTREMITIES: No cyanosis, clubbing or edema b/l.    NEUROLOGIC: Cranial nerves II through XII are intact. No focal Motor or sensory deficits b/l.   PSYCHIATRIC: The patient is alert and oriented x 3.  SKIN: No obvious rash, lesion, or ulcer.    LABORATORY PANEL:   CBC Recent Labs  Lab 09/13/18 0616  WBC 17.0*  HGB 16.0  HCT 47.2  PLT 250    ------------------------------------------------------------------------------------------------------------------  Chemistries  Recent Labs  Lab 09/12/18 1406 09/13/18 0616  NA 137 140  K 3.7 3.5  CL 96* 95*  CO2 28 30  GLUCOSE 148* 146*  BUN 27* 31*  CREATININE 1.08 0.95  CALCIUM 9.7 9.5  AST 44*  --   ALT 31  --   ALKPHOS 64  --   BILITOT 0.9  --    ------------------------------------------------------------------------------------------------------------------  Cardiac Enzymes No results for input(s): TROPONINI in the last 168 hours. ------------------------------------------------------------------------------------------------------------------  RADIOLOGY:  Dg Chest 2 View  Result Date: 09/12/2018 CLINICAL DATA:  Hypoxia. EXAM: CHEST - 2 VIEW COMPARISON:  01/13/2017 FINDINGS: Patient is rotated to the right. Lungs are hypoinflated. Most of the cardiac silhouette is right of midline similar to the previous exam making interpretation somewhat difficult. No definite effusion. It would be difficult to exclude airspace process in the right base. Possible prominence of the perihilar markings. Curvature of the thoracic spine convex right unchanged. Remainder of the exam is unchanged. IMPRESSION: Hypoinflation with possible airspace process in the right base which may be due to atelectasis or infection. Possible mild vascular congestion. Electronically Signed   By: Daniel  Boyle M.D.   On: 09/12/2018 16:49   Ct Abdomen Pelvis W Contrast  Result Date: 09/12/2018 CLINICAL DATA:  History of bowel obstruction.  Nausea and vomiting. EXAM: CT ABDOMEN AND PELVIS WITH CONTRAST TECHNIQUE: Multidetector CT imaging of the abdomen and pelvis was performed using the standard protocol following bolus administration of intravenous contrast. CONTRAST:  <[id number]951OVUE-300 IOPAMIDOL (ISOVUE-300) INJECTION 61% COMPARISON:  Radiography same day FINDINGS: Lower chest: Mild atelectasis at the lung  bases. Elevation of the hemidiaphragms. Hepatobiliary: No significant liver parenchymal finding. No calcified gallstones. Pancreas: Normal Spleen: Normal Adrenals/Urinary Tract: Adrenal glands are normal. Kidneys are normal except for a small cyst at the lower pole on the right. Bladder is normal. Stomach/Bowel: Stomach is distended with fluid. There is high-grade proximal small bowel obstruction, probably within the mid jejunum. The small bowel is collapsed distal to that. Some persistent fecal matter in the colon. Vascular/Lymphatic: Minimal atherosclerotic change of the aorta. IVC is normal. No retroperitoneal adenopathy. Reproductive: Normal Other: Small amount of free fluid in the pelvis. Musculoskeletal: Motion degraded.  No abnormality seen. IMPRESSION: High-grade small bowel obstruction, mid jejunal. No free air. Small amount of free fluid. Electronically Signed   By: Paulina Fusi M.D.   On: 09/12/2018 17:29   Dg Abd 2 Views  Result Date: 09/12/2018 CLINICAL DATA:  67 year old male with a history of nausea vomiting and diarrhea EXAM: ABDOMEN - 2 VIEW COMPARISON:  No plain film abdomen available for comparison. FINDINGS: Gaseous distention of colon with air-fluid levels of the left and right:. The configuration of colon in the left abdomen is similar to prior chest x-ray dated 01/13/2017. Formed stool in the right colon with small volume formed stool in the rectum. Gas extends to the rectum. Relative paucity of small bowel gas and gastric air. Surgical clips in the mid abdomen.  No acute displaced fracture. IMPRESSION: Plain film demonstrates colonic distention with multiple air-fluid levels, in a configuration that was present on a prior chest chest x-ray. Findings may represent chronic changes, however, a fixed obstruction cannot be excluded, and if there is concern for acute intra-abdominal process, contrast-enhanced CT may be useful. Electronically Signed   By: Gilmer Mor D.O.   On: 09/12/2018  14:35     ASSESSMENT AND PLAN:   67 year old male with past medical history of severe MR seizures who presents to the hospital from a group home due to recurrent nausea vomiting and noted to have CT abdomen pelvis findings suggestive of high-grade small bowel obstruction.  1.  Small bowel obstruction-this is a cause of patient's persistent nausea and vomiting.  Seen by general surgery no plans for surgical intervention did recommend supportive care with NG tube decompression. -NG tube was placed this afternoon, will get x-rays for placement.  Placed on intermittent suction, continue supportive care with IV fluids, antiemetics.  2.  Pneumonia-suspected to be aspiration pneumonia due to his recurrent nausea vomiting. -We will DC vancomycin, cefepime.  Placed on IV Unasyn.  3.  Essential hypertension-continue clonidine.  4.  History of seizures- cannot take p.o. due to small bowel obstruction therefore hold Tegretol, Lamictal. -We will place on IV Keppra, PRN IV Ativan.     All the records are reviewed and case discussed with Care Management/Social Worker. Management plans discussed with the patient, family and they are in agreement.  CODE STATUS: Full code  DVT Prophylaxis: Lovenox  TOTAL TIME TAKING CARE OF THIS PATIENT: 30 minutes.   POSSIBLE D/C IN 2-3 DAYS, DEPENDING ON CLINICAL CONDITION.   Houston Siren M.D on 09/13/2018 at 3:45 PM  Between 7am to 6pm - Pager - [phone]  After 6pm go to [email or web address] - Social research officer, government  [facility] [place] [facility]  Office  [phone]  CC: Primary care physician; Gracelyn Nurse, MD

## 2018-09-13 NOTE — Progress Notes (Signed)
Pharmacy Antibiotic Note  Richard Gillespie is a 67 y.o. male admitted on 09/12/2018 with aspiration pneumonia.  Pharmacy has been consulted for Unasyn dosing.  Plan: Unasyn 3 g IV q6h to start today at 1700  Height: 5\' 11"  (180.3 cm) Weight: 137 lb 12.6 oz (62.5 kg) IBW/kg (Calculated) : 75.3  Temp (24hrs), Avg:98.2 F (36.8 C), Min:97.5 F (36.4 C), Max:98.6 F (37 C)  Recent Labs  Lab 09/12/18 1406 09/13/18 0616  WBC 15.3* 17.0*  CREATININE 1.08 0.95  LATICACIDVEN 1.1  --     Estimated Creatinine Clearance: 67.6 mL/min (by C-G formula based on SCr of 0.95 mg/dL).    Allergies  Allergen Reactions  . Zithromax [Azithromycin] Other (See Comments)    unknown    Antimicrobials this admission: Cefepime 10/18 >> 10/19 Vancomycin 10/18 >> 10/19 Unasyn 10/19 >>  Dose adjustments this admission: N/A  Microbiology results: 10/18 BCx: NG 24 hours 10/18 MRSA PCR: negative  Thank you for allowing pharmacy to be a part of this patient's care.  Pricilla Riffle, PharmD Pharmacy Resident  09/13/2018 4:33 PM

## 2018-09-13 NOTE — Clinical Social Work Note (Signed)
CSW met with the patient at bedside to introduce self and to assist him as he was crying out. The patient indicated that he needed help raising one of the bed rails so that he could "go night night, sleep tight." The CSW assisted the patient and provided emotional support as the patient fell asleep.  The CSW has attempted to call [employer] with no success. The CSW will continue to attempt in order to complete a full assessment. CSW is following.  Santiago Bumpers, MSW, Latanya Presser [phone]

## 2018-09-14 ENCOUNTER — Inpatient Hospital Stay: Payer: Medicare Other

## 2018-09-14 LAB — BLOOD CULTURE ID PANEL (REFLEXED)
ACINETOBACTER BAUMANNII: NOT DETECTED
CANDIDA ALBICANS: NOT DETECTED
CANDIDA GLABRATA: NOT DETECTED
Candida krusei: NOT DETECTED
Candida parapsilosis: NOT DETECTED
Candida tropicalis: NOT DETECTED
ENTEROBACTER CLOACAE COMPLEX: NOT DETECTED
ENTEROBACTERIACEAE SPECIES: NOT DETECTED
Enterococcus species: NOT DETECTED
Escherichia coli: NOT DETECTED
Haemophilus influenzae: NOT DETECTED
Klebsiella oxytoca: NOT DETECTED
Klebsiella pneumoniae: NOT DETECTED
Listeria monocytogenes: NOT DETECTED
NEISSERIA MENINGITIDIS: NOT DETECTED
PSEUDOMONAS AERUGINOSA: NOT DETECTED
Proteus species: NOT DETECTED
STREPTOCOCCUS AGALACTIAE: NOT DETECTED
Serratia marcescens: NOT DETECTED
Staphylococcus aureus (BCID): NOT DETECTED
Staphylococcus species: NOT DETECTED
Streptococcus pneumoniae: NOT DETECTED
Streptococcus pyogenes: NOT DETECTED
Streptococcus species: NOT DETECTED

## 2018-09-14 LAB — CBC
HEMATOCRIT: 46 % (ref 39.0–52.0)
Hemoglobin: 15.6 g/dL (ref 13.0–17.0)
MCH: 30.1 pg (ref 26.0–34.0)
MCHC: 33.9 g/dL (ref 30.0–36.0)
MCV: 88.8 fL (ref 80.0–100.0)
NRBC: 0 % (ref 0.0–0.2)
PLATELETS: 232 10*3/uL (ref 150–400)
RBC: 5.18 MIL/uL (ref 4.22–5.81)
RDW: 12.3 % (ref 11.5–15.5)
WBC: 14.8 10*3/uL — AB (ref 4.0–10.5)

## 2018-09-14 LAB — BASIC METABOLIC PANEL
ANION GAP: 14 (ref 5–15)
BUN: 34 mg/dL — ABNORMAL HIGH (ref 8–23)
CALCIUM: 9.2 mg/dL (ref 8.9–10.3)
CO2: 30 mmol/L (ref 22–32)
Chloride: 102 mmol/L (ref 98–111)
Creatinine, Ser: 1 mg/dL (ref 0.61–1.24)
Glucose, Bld: 129 mg/dL — ABNORMAL HIGH (ref 70–99)
Potassium: 3.5 mmol/L (ref 3.5–5.1)
Sodium: 146 mmol/L — ABNORMAL HIGH (ref 135–145)

## 2018-09-14 MED ORDER — SODIUM CHLORIDE 0.9 % IV SOLN
INTRAVENOUS | Status: AC
  Administered 2018-09-14: 17:00:00 via INTRAVENOUS

## 2018-09-14 MED ORDER — PIPERACILLIN-TAZOBACTAM 3.375 G IVPB
3.3750 g | Freq: Three times a day (TID) | INTRAVENOUS | Status: DC
  Administered 2018-09-14 – 2018-09-16 (×6): 3.375 g via INTRAVENOUS
  Filled 2018-09-14 (×6): qty 50

## 2018-09-14 NOTE — Progress Notes (Signed)
Pt is from [facility] group home. Pt has a legal guardian  which is the brother Ahnaf Caponi) Paper document requested. Awaiting for document.

## 2018-09-14 NOTE — Progress Notes (Signed)
SURGICAL PROGRESS NOTE   Hospital Day(s): 2.   Post op day(s):  Marland Kitchen   Interval History: Patient seen and examined, no acute events or new complaints overnight. Nurse reports no change in clinical status, denies denies bowel movement.   Vital signs in last 24 hours: [min-max] current  Temp:  [98 F (36.7 C)-98.6 F (37 C)] 98.1 F (36.7 C) (10/20 0413) Pulse Rate:  [70-95] 95 (10/20 0413) Resp:  [16-24] 16 (10/20 0413) BP: (108-137)/(64-75) 137/75 (10/20 0413) SpO2:  [91 %] 91 % (10/20 0413)     Height: 5\' 11"  (180.3 cm) Weight: 62.5 kg     NGT: 1900 mL  Physical Exam:  Constitutional: alert, cooperative and no distress  Respiratory: breathing non-labored at rest  Cardiovascular: regular rate and sinus rhythm  Gastrointestinal: soft, non-tender, and mild-distended  Labs:  CBC Latest Ref Rng & Units 09/14/2018 09/13/2018 09/12/2018  WBC 4.0 - 10.5 K/uL 14.8(H) 17.0(H) 15.3(H)  Hemoglobin 13.0 - 17.0 g/dL 16.1 09.6 04.5  Hematocrit 39.0 - 52.0 % 46.0 47.2 46.8  Platelets 150 - 400 K/uL 232 250 257   CMP Latest Ref Rng & Units 09/14/2018 09/13/2018 09/12/2018  Glucose 70 - 99 mg/dL 409(W) 119(J) 478(G)  BUN 8 - 23 mg/dL 95(A) 21(H) 08(M)  Creatinine 0.61 - 1.24 mg/dL 5.78 4.69 6.29  Sodium 135 - 145 mmol/L 146(H) 140 137  Potassium 3.5 - 5.1 mmol/L 3.5 3.5 3.7  Chloride 98 - 111 mmol/L 102 95(L) 96(L)  CO2 22 - 32 mmol/L 30 30 28   Calcium 8.9 - 10.3 mg/dL 9.2 9.5 9.7  Total Protein 6.5 - 8.1 g/dL - - 8.5(H)  Total Bilirubin 0.3 - 1.2 mg/dL - - 0.9  Alkaline Phos 38 - 126 U/L - - 64  AST 15 - 41 U/L - - 44(H)  ALT 0 - 44 U/L - - 31    Imaging studies:  Xray with persistent small bowel dilation. No contrast in large intestine yet.    Assessment/Plan:  67 y.o. male with high grade small bowel obstruction and pneumonia with severe mental retardation.  Patient with no major clinical change since yesterday. No acute abdomen. Will repeat abdominal xray at 24 hours from  Gastrogaffin administration to assess progress. Will follow closely. Agree to continue IV abx therapy and medical management of medical conditions.   Gae Gallop, MD

## 2018-09-14 NOTE — Progress Notes (Signed)
PHARMACY - PHYSICIAN COMMUNICATION CRITICAL VALUE ALERT - BLOOD CULTURE IDENTIFICATION (BCID)  Richard Gillespie is an 67 y.o. male who presented to [facility] on 09/12/2018 with a chief complaint of SBO  Assessment: 1/4 GNR (aerobic bottle)  Name of physician (or Provider) Contacted: Dr. Cherlynn Kaiser  Current antibiotics: Unasyn  Changes to prescribed antibiotics recommended:  Will switch to Zosyn for broader coverage until results back from [facility]  Results for orders placed or performed during the hospital encounter of 09/12/18  Blood Culture ID Panel (Reflexed) (Collected: 09/12/2018  5:42 PM)  Result Value Ref Range   Enterococcus species NOT DETECTED NOT DETECTED   Listeria monocytogenes NOT DETECTED NOT DETECTED   Staphylococcus species NOT DETECTED NOT DETECTED   Staphylococcus aureus (BCID) NOT DETECTED NOT DETECTED   Streptococcus species NOT DETECTED NOT DETECTED   Streptococcus agalactiae NOT DETECTED NOT DETECTED   Streptococcus pneumoniae NOT DETECTED NOT DETECTED   Streptococcus pyogenes NOT DETECTED NOT DETECTED   Acinetobacter baumannii NOT DETECTED NOT DETECTED   Enterobacteriaceae species NOT DETECTED NOT DETECTED   Enterobacter cloacae complex NOT DETECTED NOT DETECTED   Escherichia coli NOT DETECTED NOT DETECTED   Klebsiella oxytoca NOT DETECTED NOT DETECTED   Klebsiella pneumoniae NOT DETECTED NOT DETECTED   Proteus species NOT DETECTED NOT DETECTED   Serratia marcescens NOT DETECTED NOT DETECTED   Haemophilus influenzae NOT DETECTED NOT DETECTED   Neisseria meningitidis NOT DETECTED NOT DETECTED   Pseudomonas aeruginosa NOT DETECTED NOT DETECTED   Candida albicans NOT DETECTED NOT DETECTED   Candida glabrata NOT DETECTED NOT DETECTED   Candida krusei NOT DETECTED NOT DETECTED   Candida parapsilosis NOT DETECTED NOT DETECTED   Candida tropicalis NOT DETECTED NOT DETECTED    Pricilla Riffle, PharmD Pharmacy Resident  09/14/2018 3:13 PM

## 2018-09-14 NOTE — Clinical Social Work Note (Signed)
CSW has attempted to contact Ulyess Blossom, RN for Richard Gillespie, to complete assessment. CSW left a HIPPA compliant voicemail and is waiting for call back. CSW is following.  Argentina Ponder, MSW, Theresia Majors [phone]

## 2018-09-14 NOTE — Progress Notes (Signed)
Pharmacy Antibiotic Note  Richard Gillespie is a 67 y.o. male admitted on 09/12/2018 with aspiration pneumonia. BCID 10/20 with 1/4 GNR (aerobic bottle). Culture sent to [facility] for identification. Per conversation with Dr. Cherlynn Kaiser, will switch to Zosyn for broader coverage while awaiting identification. Pharmacy has been consulted for Zosyn dosing.  Plan: Zosyn 3.375 g IV extended infusion q8h  Height: 5\' 11"  (180.3 cm) Weight: 137 lb 12.6 oz (62.5 kg) IBW/kg (Calculated) : 75.3  Temp (24hrs), Avg:97.9 F (36.6 C), Min:97.5 F (36.4 C), Max:98.1 F (36.7 C)  Recent Labs  Lab 09/12/18 1406 09/13/18 0616 09/14/18 0329  WBC 15.3* 17.0* 14.8*  CREATININE 1.08 0.95 1.00  LATICACIDVEN 1.1  --   --     Estimated Creatinine Clearance: 64.2 mL/min (by C-G formula based on SCr of 1 mg/dL).    Allergies  Allergen Reactions  . Zithromax [Azithromycin] Other (See Comments)    unknown    Antimicrobials this admission: Cefepime 10/18 >> 10/19 Vancomycin 10/18 >> 10/19 Unasyn 10/19 >> 10/20 Zosyn 10/20 >>  Dose adjustments this admission: N/A  Microbiology results: 10/18 BCx: 1/4 GNR (aerobic bottle) 10/18 MRSA PCR: negative  Thank you for allowing pharmacy to be a part of this patient's care.  Pricilla Riffle, PharmD Pharmacy Resident  09/14/2018 3:17 PM

## 2018-09-14 NOTE — Progress Notes (Signed)
[facility] [place] at [facility]      PATIENT NAME: Richard Gillespie    MR#:  [id number]  DATE OF BIRTH:  01-22-1951  SUBJECTIVE:   Status post NG tube placement and continues to have bilious secretions.  Abdominal x-ray this morning still showing evidence of high-grade small bowel obstruction.  No plans for surgical intervention presently.  REVIEW OF SYSTEMS:    Review of Systems  Unable to perform ROS: Mental acuity    Nutrition: NPO Tolerating Diet: No Tolerating PT: Await Eval.   DRUG ALLERGIES:   Allergies  Allergen Reactions  . Zithromax [Azithromycin] Other (See Comments)    unknown    VITALS:  Blood pressure 137/75, pulse 95, temperature 98.1 F (36.7 C), temperature source Oral, resp. rate 16, height 5\' 11"  (1.803 m), weight 62.5 kg, SpO2 91 %.  PHYSICAL EXAMINATION:   Physical Exam  GENERAL:  66 y.o.-year-old patient lying in bed in no acute distress.  EYES: Pupils equal, round, reactive to light and accommodation. No scleral icterus. Extraocular muscles intact.  HEENT: Head atraumatic, normocephalic. Oropharynx and nasopharynx clear. NG tube in place.  NECK:  Supple, no jugular venous distention. No thyroid enlargement, no tenderness.  LUNGS: Normal breath sounds bilaterally, no wheezing, rales, rhonchi. No use of accessory muscles of respiration.  CARDIOVASCULAR: S1, S2 normal. No murmurs, rubs, or gallops.  ABDOMEN: Soft, nontender, Slightly distended. Bowel sounds Hypoactive. + mid-abdomen scar from previous surgery.  No organomegaly or mass.  EXTREMITIES: No cyanosis, clubbing or edema b/l.  Mittens in place b/l.   NEUROLOGIC: Cranial nerves II through XII are intact. No focal Motor or sensory deficits b/l.   PSYCHIATRIC: The patient is alert and oriented x 3.  SKIN: No obvious rash, lesion, or ulcer.    LABORATORY PANEL:   CBC Recent Labs  Lab 09/14/18 0329  WBC 14.8*  HGB 15.6  HCT 46.0  PLT 232    ------------------------------------------------------------------------------------------------------------------  Chemistries  Recent Labs  Lab 09/12/18 1406  09/14/18 0329  NA 137   < > 146*  K 3.7   < > 3.5  CL 96*   < > 102  CO2 28   < > 30  GLUCOSE 148*   < > 129*  BUN 27*   < > 34*  CREATININE 1.08   < > 1.00  CALCIUM 9.7   < > 9.2  AST 44*  --   --   ALT 31  --   --   ALKPHOS 64  --   --   BILITOT 0.9  --   --    < > = values in this interval not displayed.   ------------------------------------------------------------------------------------------------------------------  Cardiac Enzymes No results for input(s): TROPONINI in the last 168 hours. ------------------------------------------------------------------------------------------------------------------  RADIOLOGY:  Dg Chest 2 View  Result Date: 09/12/2018 CLINICAL DATA:  Hypoxia. EXAM: CHEST - 2 VIEW COMPARISON:  01/13/2017 FINDINGS: Patient is rotated to the right. Lungs are hypoinflated. Most of the cardiac silhouette is right of midline similar to the previous exam making interpretation somewhat difficult. No definite effusion. It would be difficult to exclude airspace process in the right base. Possible prominence of the perihilar markings. Curvature of the thoracic spine convex right unchanged. Remainder of the exam is unchanged. IMPRESSION: Hypoinflation with possible airspace process in the right base which may be due to atelectasis or infection. Possible mild vascular congestion. Electronically Signed   By: Elberta Fortis M.D.   On: 09/12/2018 16:49   Ct Abdomen  Pelvis W Contrast  Result Date: 09/12/2018 CLINICAL DATA:  History of bowel obstruction.  Nausea and vomiting. EXAM: CT ABDOMEN AND PELVIS WITH CONTRAST TECHNIQUE: Multidetector CT imaging of the abdomen and pelvis was performed using the standard protocol following bolus administration of intravenous contrast. CONTRAST:  ISOVUE-300 IOPAMIDOL  (ISOVUE-300) INJECTION 61% COMPARISON:  Radiography same day FINDINGS: Lower chest: Mild atelectasis at the lung bases. Elevation of the hemidiaphragms. Hepatobiliary: No significant liver parenchymal finding. No calcified gallstones. Pancreas: Normal Spleen: Normal Adrenals/Urinary Tract: Adrenal glands are normal. Kidneys are normal except for a small cyst at the lower pole on the right. Bladder is normal. Stomach/Bowel: Stomach is distended with fluid. There is high-grade proximal small bowel obstruction, probably within the mid jejunum. The small bowel is collapsed distal to that. Some persistent fecal matter in the colon. Vascular/Lymphatic: Minimal atherosclerotic change of the aorta. IVC is normal. No retroperitoneal adenopathy. Reproductive: Normal Other: Small amount of free fluid in the pelvis. Musculoskeletal: Motion degraded.  No abnormality seen. IMPRESSION: High-grade small bowel obstruction, mid jejunal. No free air. Small amount of free fluid. Electronically Signed   By: Paulina Fusi M.D.   On: 09/12/2018 17:29   Dg Abd 2 Views  Result Date: 09/12/2018 CLINICAL DATA:  67 year old male with a history of nausea vomiting and diarrhea EXAM: ABDOMEN - 2 VIEW COMPARISON:  No plain film abdomen available for comparison. FINDINGS: Gaseous distention of colon with air-fluid levels of the left and right:. The configuration of colon in the left abdomen is similar to prior chest x-ray dated 01/13/2017. Formed stool in the right colon with small volume formed stool in the rectum. Gas extends to the rectum. Relative paucity of small bowel gas and gastric air. Surgical clips in the mid abdomen.  No acute displaced fracture. IMPRESSION: Plain film demonstrates colonic distention with multiple air-fluid levels, in a configuration that was present on a prior chest chest x-ray. Findings may represent chronic changes, however, a fixed obstruction cannot be excluded, and if there is concern for acute intra-abdominal  process, contrast-enhanced CT may be useful. Electronically Signed   By: Gilmer Mor D.O.   On: 09/12/2018 14:35   Dg Abd Portable 1v-small Bowel Obstruction Protocol-initial, 8 Hr Delay  Result Date: 09/14/2018 CLINICAL DATA:  Small bowel obstruction. EXAM: PORTABLE ABDOMEN - 1 VIEW COMPARISON:  Radiograph yesterday.  CT 2 days ago. FINDINGS: Images labeled 8 hour, presumably 8 hour delayed after enteric contrast administration. Enteric contrast seen in dilated small bowel, no contrast in the colon. Enteric tube tip and side-port below the diaphragm in the stomach. Persistent and unchanged gaseous small bowel distension primarily in the left abdomen. Moderate stool in the right colon. Stool in the rectum. IMPRESSION: 1. Persistent small bowel obstruction with gaseous small bowel distention in the left abdomen. 2. Administered enteric contrast in dilated small bowel, contrast has not reached the colon. Electronically Signed   By: Narda Rutherford M.D.   On: 09/14/2018 02:39   Dg Abd Portable 1v-small Bowel Protocol-position Verification  Result Date: 09/13/2018 CLINICAL DATA:  Status post NG tube placement. EXAM: PORTABLE ABDOMEN - 1 VIEW COMPARISON:  CT abdomen and pelvis and plain films the abdomen yesterday. FINDINGS: NG tube is in place with the tip within the site mint. The side port projects just within the stomach. Marked distention of bowel persists. IMPRESSION: The patient's NG tube could be advanced 2-3 cm for better positioning. Electronically Signed   By: Drusilla Kanner M.D.   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  1.  Small bowel obstruction-this is the cause of patient's persistent nausea and vomiting.  Seen by general surgery no plans  for surgical intervention -Status post NG tube placement yesterday with bilious secretions.  Patient still continues to have significant secretions.  Abdominal x-ray this morning still showing evidence of high-grade obstruction.  Continue supportive care for now.  Appreciate surgical input. - Continue IV fluids anti-emetics  2.  Pneumonia-suspected to be aspiration pneumonia due to his recurrent nausea vomiting. - cont. Unasyn.    3.  Essential hypertension-continue clonidine.  4.  History of seizures- cannot take p.o. due to small bowel obstruction therefore hold Tegretol, Lamictal. - cont.  IV Keppra, PRN IV Ativan.   All the records are reviewed and case discussed with Care Management/Social Worker. Management plans discussed with the patient, family and they are in agreement.  CODE STATUS: Full code  DVT Prophylaxis: Lovenox  TOTAL TIME TAKING CARE OF THIS PATIENT: 30 minutes.   POSSIBLE D/C IN 2-3 DAYS, DEPENDING ON CLINICAL CONDITION.   Houston Siren M.D on 09/14/2018 at 2:11 PM  Between 7am to 6pm - Pager - [phone]  After 6pm go to [email or web address] - Social research officer, government  [facility] [place] [facility]  Office  [phone]  CC: Primary care physician; Gracelyn Nurse, MD

## 2018-09-15 LAB — BASIC METABOLIC PANEL
ANION GAP: 9 (ref 5–15)
BUN: 31 mg/dL — ABNORMAL HIGH (ref 8–23)
CALCIUM: 8.4 mg/dL — AB (ref 8.9–10.3)
CHLORIDE: 111 mmol/L (ref 98–111)
CO2: 30 mmol/L (ref 22–32)
Creatinine, Ser: 0.8 mg/dL (ref 0.61–1.24)
GFR calc non Af Amer: >60 mL/min (ref >60)
Glucose, Bld: 115 mg/dL — ABNORMAL HIGH (ref 70–99)
Potassium: 3.4 mmol/L — ABNORMAL LOW (ref 3.5–5.1)
Sodium: 150 mmol/L — ABNORMAL HIGH (ref 135–145)

## 2018-09-15 LAB — CBC
HCT: 45.1 % (ref 39.0–52.0)
Hemoglobin: 14.5 g/dL (ref 13.0–17.0)
MCH: 29.8 pg (ref 26.0–34.0)
MCHC: 32.2 g/dL (ref 30.0–36.0)
MCV: 92.6 fL (ref 80.0–100.0)
NRBC: 0 % (ref 0.0–0.2)
PLATELETS: 203 10*3/uL (ref 150–400)
RBC: 4.87 MIL/uL (ref 4.22–5.81)
RDW: 12.2 % (ref 11.5–15.5)
WBC: 11.5 10*3/uL — ABNORMAL HIGH (ref 4.0–10.5)

## 2018-09-15 MED ORDER — DEXTROSE 5 % IV SOLN: INTRAVENOUS | Status: DC

## 2018-09-15 MED ORDER — HALOPERIDOL LACTATE 5 MG/ML IJ SOLN
5.0000 mg | Freq: Once | INTRAMUSCULAR | Status: AC
  Administered 2018-09-15: 5 mg via INTRAVENOUS
  Filled 2018-09-15: qty 1

## 2018-09-15 MED ORDER — LORAZEPAM 2 MG/ML IJ SOLN
1.0000 mg | Freq: Once | INTRAMUSCULAR | Status: DC
  Filled 2018-09-15: qty 1

## 2018-09-15 MED ORDER — POTASSIUM CL IN DEXTROSE 5% 20 MEQ/L IV SOLN
20.0000 meq | INTRAVENOUS | Status: DC
  Administered 2018-09-15 – 2018-09-18 (×6): 20 meq via INTRAVENOUS
  Filled 2018-09-15 (×9): qty 1000

## 2018-09-15 NOTE — Progress Notes (Signed)
SURGICAL PROGRESS NOTE   Hospital Day(s): 3.   Post op day(s):  Marland Kitchen   Interval History: Patient seen and examined, no acute events or new complaints overnight. Nurse and sitter reports no change in his bowel function. Refers episode of significant agitation that was controlled with haldol.  Vital signs in last 24 hours: [min-max] current  Temp:  [97.9 F (36.6 C)-98.3 F (36.8 C)] 98.3 F (36.8 C) (10/21 1316) Pulse Rate:  [71-89] 75 (10/21 1316) Resp:  [16-26] 22 (10/21 1316) BP: (124-146)/(71-77) 129/75 (10/21 1316) SpO2:  [94 %-100 %] 99 % (10/21 1316)     Height: 5\' 11"  (180.3 cm) Weight: 62.5 kg     Physical Exam:  Constitutional: sleeping in no distress.  Gastrointestinal: soft, non-tender, and mild-distended  Labs:  CBC Latest Ref Rng & Units 09/15/2018 09/14/2018 09/13/2018  WBC 4.0 - 10.5 K/uL 11.5(H) 14.8(H) 17.0(H)  Hemoglobin 13.0 - 17.0 g/dL 16.1 09.6 04.5  Hematocrit 39.0 - 52.0 % 45.1 46.0 47.2  Platelets 150 - 400 K/uL 203 232 250   CMP Latest Ref Rng & Units 09/15/2018 09/14/2018 09/13/2018  Glucose 70 - 99 mg/dL 409(W) 119(J) 478(G)  BUN 8 - 23 mg/dL 95(A) 21(H) 08(M)  Creatinine 0.61 - 1.24 mg/dL 5.78 4.69 6.29  Sodium 135 - 145 mmol/L 150(H) 146(H) 140  Potassium 3.5 - 5.1 mmol/L 3.4(L) 3.5 3.5  Chloride 98 - 111 mmol/L 111 102 95(L)  CO2 22 - 32 mmol/L 30 30 30   Calcium 8.9 - 10.3 mg/dL 5.2(W) 9.2 9.5  Total Protein 6.5 - 8.1 g/dL - - -  Total Bilirubin 0.3 - 1.2 mg/dL - - -  Alkaline Phos 38 - 126 U/L - - -  AST 15 - 41 U/L - - -  ALT 0 - 44 U/L - - -    Imaging studies: No new pertinent imaging studies  Assessment/Plan:  67 y.o.malewith high grade small bowel obstruction and pneumonia with severe mental retardation.  No change on his bowel function. Decreased NGT drainage. No sign of flatus or stool. Difficult to follow clinically due to mental retardation. Agree with Xray tomorrow. No need of acute surgery at this moment. Will follow  closely.   Gae Gallop, MD

## 2018-09-15 NOTE — Progress Notes (Signed)
[facility] - [place] at [facility]      PATIENT NAME: Richard Gillespie    MR#:  [id number]  DATE OF BIRTH:  06/18/1951  SUBJECTIVE:   Pt. Was quite agitated and confused this morning.  Patient received some Haldol and Ativan which seems to have calmed him down.  NG tube remains in place.  Continue sitter as needed.  REVIEW OF SYSTEMS:    Review of Systems  Unable to perform ROS: Mental acuity    Nutrition: NPO Tolerating Diet: No Tolerating PT: Ambulatory  DRUG ALLERGIES:   Allergies  Allergen Reactions  . Zithromax [Azithromycin] Other (See Comments)    unknown    VITALS:  Blood pressure 129/75, pulse 75, temperature 98.3 F (36.8 C), temperature source Oral, resp. rate (!) 22, height 5\' 11"  (1.803 m), weight 62.5 kg, SpO2 99 %.  PHYSICAL EXAMINATION:   Physical Exam  GENERAL:  67 y.o.-year-old patient lying in bed calm after receiving some haldol, ativan.  EYES: Pupils equal, round, reactive to light. No scleral icterus. Extraocular muscles intact.  HEENT: Head atraumatic, normocephalic. Oropharynx and nasopharynx clear. NG tube in place with bilious drainage noted.  NECK:  Supple, no jugular venous distention. No thyroid enlargement, no tenderness.  LUNGS: Normal breath sounds bilaterally, no wheezing, rales, rhonchi. No use of accessory muscles of respiration.  CARDIOVASCULAR: S1, S2 normal. No murmurs, rubs, or gallops.  ABDOMEN: Soft, nontender, Slightly distended. Bowel sounds Hypoactive. + mid-abdomen scar from previous surgery.  No organomegaly or mass.  EXTREMITIES: No cyanosis, clubbing or edema b/l.  Mittens in place b/l.   NEUROLOGIC: Cranial nerves II through XII are intact. No focal Motor or sensory deficits b/l.  PSYCHIATRIC: The patient is alert and oriented x 1.  SKIN: No obvious rash, lesion, or ulcer.    LABORATORY PANEL:   CBC Recent Labs  Lab 09/15/18 0652  WBC 11.5*  HGB 14.5  HCT 45.1  PLT 203    ------------------------------------------------------------------------------------------------------------------  Chemistries  Recent Labs  Lab 09/12/18 1406  09/15/18 0652  NA 137   < > 150*  K 3.7   < > 3.4*  CL 96*   < > 111  CO2 28   < > 30  GLUCOSE 148*   < > 115*  BUN 27*   < > 31*  CREATININE 1.08   < > 0.80  CALCIUM 9.7   < > 8.4*  AST 44*  --   --   ALT 31  --   --   ALKPHOS 64  --   --   BILITOT 0.9  --   --    < > = values in this interval not displayed.   ------------------------------------------------------------------------------------------------------------------  Cardiac Enzymes No results for input(s): TROPONINI in the last 168 hours. ------------------------------------------------------------------------------------------------------------------  RADIOLOGY:  Dg Abd 1 View  Result Date: 09/14/2018 CLINICAL DATA:  24 hour delayed film for small bowel obstruction protocol EXAM: ABDOMEN - 1 VIEW COMPARISON:  09/14/2018, 8 hour film FINDINGS: NG tube is present within the stomach. Small bowel distention has decreased. There is gas, stool, and contrast material noted in the colon, particularly the right colon. No organomegaly or visible free air. IMPRESSION: Gaseous distention of small bowel has improved since prior study. Contrast material has passed into the right colon. NG tube remains in the stomach. Electronically Signed   By: Charlett Nose M.D.   On: 09/14/2018 19:45   Dg Abd Portable 1v-small Bowel Obstruction Protocol-initial, 8 Hr Delay  Result Date:  09/14/2018 CLINICAL DATA:  Small bowel obstruction. EXAM: PORTABLE ABDOMEN - 1 VIEW COMPARISON:  Radiograph yesterday.  CT 2 days ago. FINDINGS: Images labeled 8 hour, presumably 8 hour delayed after enteric contrast administration. Enteric contrast seen in dilated small bowel, no contrast in the colon. Enteric tube tip and side-port below the diaphragm in the stomach. Persistent and unchanged gaseous  small bowel distension primarily in the left abdomen. Moderate stool in the right colon. Stool in the rectum. IMPRESSION: 1. Persistent small bowel obstruction with gaseous small bowel distention in the left abdomen. 2. Administered enteric contrast in dilated small bowel, contrast has not reached the colon. Electronically Signed   By: Narda Rutherford M.D.   On: 09/14/2018 02:39   Dg Abd Portable 1v-small Bowel Protocol-position Verification  Result Date: 09/13/2018 CLINICAL DATA:  Status post NG tube placement. EXAM: PORTABLE ABDOMEN - 1 VIEW COMPARISON:  CT abdomen and pelvis and plain films the abdomen yesterday. FINDINGS: NG tube is in place with the tip within the site mint. The side port projects just within the stomach. Marked distention of bowel persists. IMPRESSION: The patient's NG tube could be advanced 2-3 cm for better positioning. Electronically Signed   By: Drusilla Kanner M.D.   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  1.  Small bowel obstruction-this is the cause of patient's persistent nausea and vomiting.  Seen by general surgery no plans for surgical intervention -Status post NG tube placement yesterday with bilious secretions.  Had 1 L drainage from NG tube yesterday.  - will repeat X-ray in a.m. Cont. IV fluids, antiemetics and supportive care for now.  Appreciate surgical input.  2.  Agitation-patient became quite agitated this morning.  Likely secondary to underlying mental retardation. - Improved with some Haldol and Ativan as needed.  We will continue to monitor.  Continue one-to-one Recruitment consultant.  3.  Pneumonia-suspected to be aspiration pneumonia due to his recurrent nausea vomiting. -Patient noted to have some  gram-negative bacteremia yesterday therefore antibiotics switched from Unasyn to Zosyn we will continue to follow sensitivities.  4.  Essential hypertension-continue clonidine.  5.  History of seizures- cannot take p.o. due to small bowel obstruction therefore hold Tegretol, Lamictal. - cont.  IV Keppra, PRN IV Ativan.  6.  Hypernatremia-secondary to dehydration and underlying small bowel obstruction. - We will change IV fluids to D5W, follow sodium.  Called the patient's brother and updated him on patient's plan of care.   All the records are reviewed and case discussed with Care Management/Social Worker. Management plans discussed with the patient, family and they are in agreement.  CODE STATUS: Full code  DVT Prophylaxis: Lovenox  TOTAL TIME TAKING CARE OF THIS PATIENT: 35 minutes.   POSSIBLE D/C IN 2-3 DAYS, DEPENDING ON CLINICAL CONDITION.   [place] Siren M.D on 09/15/2018 at 2:04 PM  Between 7am to 6pm - Pager - [phone]  After 6pm go to [email or web address] - Social research officer, government  [facility] [place] [facility]  Office  [phone]  CC: Primary care physician; Gracelyn Nurse, MD

## 2018-09-15 NOTE — Care Management Important Message (Signed)
Copy of signed IM left with patient in room.  

## 2018-09-15 NOTE — Progress Notes (Signed)
Patient was very aggitated and combative while being assisted to the bathroom.  He wanted to sit down in the floor.  Staff had to place him in a chair.  He was swinging at staff and hitting and kicking.  Ativan given but did not help.  Dr Cherlynn Kaiser notified and immediately came to see the patient.  Haldol ordered and given but did not help.  Ativan 1mg  ordered and given.  Patient finally started to calm down.

## 2018-09-15 NOTE — Care Management (Signed)
Patient with profound mental retardation, presents from [facility] with bowel obstruction and pneumonia. Nasogastric tube placed, patient is NPO and thus far, there does not appear to be much improvement in the obstruction.  Patient had period of significant agitation today requiring Haldol and Ativan

## 2018-09-16 ENCOUNTER — Inpatient Hospital Stay: Payer: Medicare Other

## 2018-09-16 LAB — BASIC METABOLIC PANEL
ANION GAP: 10 (ref 5–15)
BUN: 24 mg/dL — AB (ref 8–23)
CALCIUM: 8.6 mg/dL — AB (ref 8.9–10.3)
CO2: 29 mmol/L (ref 22–32)
Chloride: 109 mmol/L (ref 98–111)
Creatinine, Ser: 0.8 mg/dL (ref 0.61–1.24)
GFR calc Af Amer: >60 mL/min (ref >60)
GFR calc non Af Amer: >60 mL/min (ref >60)
GLUCOSE: 107 mg/dL — AB (ref 70–99)
POTASSIUM: 3.5 mmol/L (ref 3.5–5.1)
Sodium: 148 mmol/L — ABNORMAL HIGH (ref 135–145)

## 2018-09-16 LAB — CULTURE, BLOOD (ROUTINE X 2)

## 2018-09-16 LAB — HIV ANTIBODY (ROUTINE TESTING W REFLEX): HIV Screen 4th Generation wRfx: NONREACTIVE

## 2018-09-16 LAB — MAGNESIUM: MAGNESIUM: 2.4 mg/dL (ref 1.7–2.4)

## 2018-09-16 MED ORDER — HALOPERIDOL LACTATE 5 MG/ML IJ SOLN
5.0000 mg | Freq: Four times a day (QID) | INTRAMUSCULAR | Status: DC | PRN
  Administered 2018-09-16: 5 mg via INTRAVENOUS

## 2018-09-16 MED ORDER — LORAZEPAM 2 MG/ML IJ SOLN: 0.5000 mg | Freq: Once | INTRAMUSCULAR | Status: DC | PRN

## 2018-09-16 MED ORDER — SODIUM CHLORIDE 0.9 % IV SOLN
INTRAVENOUS | Status: DC | PRN
  Administered 2018-09-16 – 2018-09-18 (×5): 250 mL via INTRAVENOUS

## 2018-09-16 MED ORDER — SODIUM CHLORIDE 0.9 % IV SOLN
3.0000 g | Freq: Four times a day (QID) | INTRAVENOUS | Status: DC
  Administered 2018-09-16 – 2018-09-19 (×12): 3 g via INTRAVENOUS
  Filled 2018-09-16 (×15): qty 3

## 2018-09-16 MED ORDER — HYDRALAZINE HCL 20 MG/ML IJ SOLN
10.0000 mg | Freq: Once | INTRAMUSCULAR | Status: AC
  Administered 2018-09-16: 10 mg via INTRAVENOUS
  Filled 2018-09-16: qty 1

## 2018-09-16 MED ORDER — HALOPERIDOL LACTATE 5 MG/ML IJ SOLN: 5.0000 mg | Freq: Once | INTRAMUSCULAR | Status: AC

## 2018-09-16 NOTE — Clinical Social Work Note (Signed)
Clinical Social Work Assessment  Patient Details  Name: Richard Gillespie MRN: [id number] Date of Birth: [birth year]  Date of referral:  09/16/18               Reason for consult:  Facility Placement                Permission sought to share information with:    Permission granted to share information::     Name::        Agency::     Relationship::     Contact Information:     Housing/Transportation Living arrangements for the past 2 months:  Group Home Source of Information:  Facility Patient Interpreter Needed:  None Criminal Activity/Legal Involvement Pertinent to Current Situation/Hospitalization:  No - Comment as needed Significant Relationships:  Siblings Lives with:  Facility Resident Do you feel safe going back to the place where you live?  Yes Need for family participation in patient care:  Yes (Comment)  Care giving concerns:  Patient resides at [facility].    Social Worker assessment / plan:  CSW spoke with Lupita Leash, the Human resources officer, and she can take patient back when time. She stated that the only reason that they wouldn't be able to take him back was if he had to have an ileostomy.   Employment status:  Disabled (Comment on whether or not currently receiving Disability) Insurance information:    PT Recommendations:    Information / Referral to community resources:     Patient/Family's Response to care:  Patient is severely cognitively disabled.   Patient/Family's Understanding of and Emotional Response to Diagnosis, Current Treatment, and Prognosis:  Patient is severely cognitively disabled.   Emotional Assessment Appearance:  Appears stated age Attitude/Demeanor/Rapport:  (calm) Affect (typically observed):    Orientation:  Oriented to Self Alcohol / Substance use:  Not Applicable Psych involvement (Current and /or in the community):     Discharge Needs  Concerns to be addressed:  Care Coordination Readmission within the last 30 days:   No Current discharge risk:  None, Physical Impairment Barriers to Discharge:      York Spaniel, LCSW 09/16/2018, 2:33 PM

## 2018-09-16 NOTE — Progress Notes (Signed)
Pt very agitated at this time PRN 0.5mg  ativan was given with not relief. Sitter was holding pt up with his knees touching the floor. VSS. NGT in place. MD notified. New orders for a one time dose of Haldol 5 mg IV at this time and a one time dose of 0.5 mg IV ativan if Haldol does not work. RN gave the 5 mg of Haldol IV pt had some relief and started to relax. The order for a one time dose of 0.5 mg ativan is still within the [facility] if needed. Pt is in the bed resting at this time.   Richard Gillespie [employer]

## 2018-09-16 NOTE — Progress Notes (Signed)
Pharmacy Antibiotic Note  Richard Gillespie is a 67 y.o. male admitted on 09/12/2018 with aspiration pneumonia. BCID 10/20 with 1/4 GNR (aerobic bottle). Culture sent to [facility] [place] for identification. This morning the culture was reported as GPR (Bacillus sp) and, therefore, considered a contaminant. Dr Cherlynn Kaiser would like to return to Unasyn therapy to treat the patient's PNA. Pharmacy has been consulted for Unasyn dosing.  Plan: Restart Unasyn 3 grams IV every 6 hours  Height: 5\' 11"  (180.3 cm) Weight: 137 lb 12.6 oz (62.5 kg) IBW/kg (Calculated) : 75.3  Temp (24hrs), Avg:97.9 F (36.6 C), Min:97.5 F (36.4 C), Max:98.3 F (36.8 C)  Recent Labs  Lab 09/12/18 1406 09/13/18 0616 09/14/18 0329 09/15/18 0652 09/16/18 0634  WBC 15.3* 17.0* 14.8* 11.5*  --   CREATININE 1.08 0.95 1.00 0.80 0.80  LATICACIDVEN 1.1  --   --   --   --     Estimated Creatinine Clearance: 80.3 mL/min (by C-G formula based on SCr of 0.8 mg/dL).    Allergies  Allergen Reactions  . Zithromax [Azithromycin] Other (See Comments)    unknown    Antimicrobials this admission: Cefepime 10/18 >> 10/19 Vancomycin 10/18 >> 10/19 Unasyn 10/19 >> 10/20 Zosyn 10/20 >>10/22 Unasyn 10/22>>  Microbiology results: 10/18 BCx: 1/4 GPR (aerobic bottle) 10/18 MRSA PCR: negative  Thank you for allowing pharmacy to be a part of this patient's care.  Lowella Bandy, PharmD Clinical Pharmacist 09/16/2018 10:54 AM

## 2018-09-16 NOTE — Progress Notes (Signed)
PHARMACY - PHYSICIAN COMMUNICATION CRITICAL VALUE ALERT - BLOOD CULTURE IDENTIFICATION (BCID)  Results for orders placed or performed during the hospital encounter of 09/12/18  Blood Culture ID Panel (Reflexed) (Collected: 09/12/2018  5:42 PM)  Result Value Ref Range   Enterococcus species NOT DETECTED NOT DETECTED   Listeria monocytogenes NOT DETECTED NOT DETECTED   Staphylococcus species NOT DETECTED NOT DETECTED   Staphylococcus aureus (BCID) NOT DETECTED NOT DETECTED   Streptococcus species NOT DETECTED NOT DETECTED   Streptococcus agalactiae NOT DETECTED NOT DETECTED   Streptococcus pneumoniae NOT DETECTED NOT DETECTED   Streptococcus pyogenes NOT DETECTED NOT DETECTED   Acinetobacter baumannii NOT DETECTED NOT DETECTED   Enterobacteriaceae species NOT DETECTED NOT DETECTED   Enterobacter cloacae complex NOT DETECTED NOT DETECTED   Escherichia coli NOT DETECTED NOT DETECTED   Klebsiella oxytoca NOT DETECTED NOT DETECTED   Klebsiella pneumoniae NOT DETECTED NOT DETECTED   Proteus species NOT DETECTED NOT DETECTED   Serratia marcescens NOT DETECTED NOT DETECTED   Haemophilus influenzae NOT DETECTED NOT DETECTED   Neisseria meningitidis NOT DETECTED NOT DETECTED   Pseudomonas aeruginosa NOT DETECTED NOT DETECTED   Candida albicans NOT DETECTED NOT DETECTED   Candida glabrata NOT DETECTED NOT DETECTED   Candida krusei NOT DETECTED NOT DETECTED   Candida parapsilosis NOT DETECTED NOT DETECTED   Candida tropicalis NOT DETECTED NOT DETECTED    Name of physician (or Provider) Contacted: Sainani  Changes to prescribed antibiotics required: antibiotics were previously reported as GNR, which was corrected this morning to GPR (1/4) and considered a contaminant. Antibiotics are being changed back to Unasyn for PNA treatment    Lowella Bandy, PharmD 09/16/2018  10:50 AM

## 2018-09-16 NOTE — Progress Notes (Signed)
[employer] [facility] [facility] [place] [facility]      PATIENT NAME: Richard Gillespie    MR#:  [id number]  DATE OF BIRTH:  [birth year]  SUBJECTIVE:   No other acute events overnight.  NG tube in place.  Abdominal x-ray this morning showing improvement in SBO.  REVIEW OF SYSTEMS:    Review of Systems  Unable to perform ROS: Mental acuity    Nutrition: NPO Tolerating Diet: No Tolerating PT: Ambulatory  DRUG ALLERGIES:   Allergies  Allergen Reactions  . Zithromax [Azithromycin] Other (See Comments)    unknown    VITALS:  Blood pressure (!) 138/105, pulse (!) 101, temperature 98.3 F (36.8 C), temperature source Oral, resp. rate 20, height 5\' 11"  (1.803 m), weight 62.5 kg, SpO2 94 %.  PHYSICAL EXAMINATION:   Physical Exam  GENERAL:  67 y.o.-year-old patient lying in bed in NAD.  EYES: Pupils equal, round, reactive to light. No scleral icterus. Extraocular muscles intact.  HEENT: Head atraumatic, normocephalic. Oropharynx and nasopharynx clear. NG tube in place with bilious drainage noted.  NECK:  Supple, no jugular venous distention. No thyroid enlargement, no tenderness.  LUNGS: Normal breath sounds bilaterally, no wheezing, rales, rhonchi. No use of accessory muscles of respiration.  CARDIOVASCULAR: S1, S2 normal. No murmurs, rubs, or gallops.  ABDOMEN: Soft, nontender, Slightly distended. Bowel sounds Hypoactive. + mid-abdomen scar from previous surgery.  No organomegaly or mass.  EXTREMITIES: No cyanosis, clubbing or edema b/l.  Mittens in place b/l.   NEUROLOGIC: Cranial nerves II through XII are intact. No focal Motor or sensory deficits b/l.  PSYCHIATRIC: The patient is alert and oriented x 1.  SKIN: No obvious rash, lesion, or ulcer.    LABORATORY PANEL:   CBC Recent Labs  Lab 09/15/18 0652  WBC 11.5*  HGB 14.5  HCT 45.1  PLT 203    ------------------------------------------------------------------------------------------------------------------  Chemistries  Recent Labs  Lab 09/12/18 1406  09/16/18 0634  NA 137   < > 148*  K 3.7   < > 3.5  CL 96*   < > 109  CO2 28   < > 29  GLUCOSE 148*   < > 107*  BUN 27*   < > 24*  CREATININE 1.08   < > 0.80  CALCIUM 9.7   < > 8.6*  MG  --   --  2.4  AST 44*  --   --   ALT 31  --   --   ALKPHOS 64  --   --   BILITOT 0.9  --   --    < > = values in this interval not displayed.   ------------------------------------------------------------------------------------------------------------------  Cardiac Enzymes No results for input(s): TROPONINI in the last 168 hours. ------------------------------------------------------------------------------------------------------------------  RADIOLOGY:  Dg Abd 1 View  Result Date: 09/14/2018 CLINICAL DATA:  24 hour delayed film for small bowel obstruction protocol EXAM: ABDOMEN - 1 VIEW COMPARISON:  09/14/2018, 8 hour film FINDINGS: NG tube is present within the stomach. Small bowel distention has decreased. There is gas, stool, and contrast material noted in the colon, particularly the right colon. No organomegaly or visible free air. IMPRESSION: Gaseous distention of small bowel has improved since prior study. Contrast material has passed into the right colon. NG tube remains in the stomach. Electronically Signed   By: Charlett Nose M.D.   On: 09/14/2018 19:45   Dg Abd 2 Views  Result Date: 09/16/2018 CLINICAL DATA:  Small bowel obstruction. EXAM: ABDOMEN - 2 VIEW COMPARISON:  Radiographs of September 14, 2018. FINDINGS: Distal tip of nasogastric tube is seen in proximal stomach. Moderate amount of stool is seen in the colon and rectum. No significant bowel dilatation is noted. IMPRESSION: No definite evidence of bowel obstruction or ileus. Moderate stool burden is noted. Electronically Signed   By: Lupita Raider, M.D.   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  1.  Small bowel obstruction-this is the cause of patient's persistent nausea and vomiting.  Seen by general surgery no plans for surgical intervention -Status post NG tube placement yesterday with bilious secretions.  Had 140 cc bilious drainage yesterday.  Abdominal x-ray this morning showing improvement in the bowel distention.  Seen by general surgery and they are okay with clamping the NG tube and giving him ice chips.  We will repeat x-rays again in the morning.  2.  Agitation-improved since yesterday.  Cont. 1:1 sitter.   3.  Pneumonia-suspected to be aspiration pneumonia due to his recurrent nausea vomiting. -Patient's blood cultures are consistent with contamination and not likely a true pathogen.  Was on Zosyn we will switch to IV Unasyn for underlying aspiration pneumonia.  4.  Essential hypertension-continue clonidine.  5.  History of seizures- cannot take p.o. due to small bowel obstruction therefore hold Tegretol, Lamictal. - cont.  IV Keppra, PRN IV Ativan.  6.  Hypernatremia-secondary to dehydration and underlying small bowel obstruction. -Continue D5W and sodium is improving.   All the records are reviewed and case discussed with Care Management/Social Worker. Management plans discussed with the patient, family and they are in agreement.  CODE STATUS: Full code  DVT Prophylaxis: Lovenox  TOTAL TIME TAKING CARE OF THIS PATIENT: 30 minutes.   POSSIBLE D/C IN 2-3 DAYS, DEPENDING ON CLINICAL CONDITION.   Houston Siren M.D on 09/16/2018 at 2:01 PM  Between 7am to 6pm - Pager - [phone]  After 6pm go to [email or web address] - Social research officer, government  [facility] [place] [facility]   Office  [phone]  CC: Primary care physician; Gracelyn Nurse, MD

## 2018-09-16 NOTE — Progress Notes (Signed)
SURGICAL PROGRESS NOTE   Hospital Day(s): 4.   Post op day(s):  Marland Kitchen   Interval History: Patient seen and examined, no acute events or new complaints overnight. Nurse denies any sign of bowel function. Patient unable to give history due to mental retardation.  Vital signs in last 24 hours: [min-max] current  Temp:  [97.5 F (36.4 C)-98.6 F (37 C)] 98.3 F (36.8 C) (10/22 1320) Pulse Rate:  [71-104] 101 (10/22 1320) Resp:  [18-20] 20 (10/22 1320) BP: (134-162)/(71-105) 138/105 (10/22 1320) SpO2:  [87 %-98 %] 94 % (10/22 1320)     Height: 5\' 11"  (180.3 cm) Weight: 62.5 kg     Physical Exam:  Constitutional: no distress Respiratory: breathing non-labored at rest  Cardiovascular: regular rate and sinus rhythm  Gastrointestinal: soft, non-tender, and non-distended  Labs:  CBC Latest Ref Rng & Units 09/15/2018 09/14/2018 09/13/2018  WBC 4.0 - 10.5 K/uL 11.5(H) 14.8(H) 17.0(H)  Hemoglobin 13.0 - 17.0 g/dL 95.2 84.1 32.4  Hematocrit 39.0 - 52.0 % 45.1 46.0 47.2  Platelets 150 - 400 K/uL 203 232 250   CMP Latest Ref Rng & Units 09/16/2018 09/15/2018 09/14/2018  Glucose 70 - 99 mg/dL 401(U) 272(Z) 366(Y)  BUN 8 - 23 mg/dL 40(H) 47(Q) 25(Z)  Creatinine 0.61 - 1.24 mg/dL 5.63 8.75 6.43  Sodium 135 - 145 mmol/L 148(H) 150(H) 146(H)  Potassium 3.5 - 5.1 mmol/L 3.5 3.4(L) 3.5  Chloride 98 - 111 mmol/L 109 111 102  CO2 22 - 32 mmol/L 29 30 30   Calcium 8.9 - 10.3 mg/dL 3.2(R) 5.1(O) 9.2  Total Protein 6.5 - 8.1 g/dL - - -  Total Bilirubin 0.3 - 1.2 mg/dL - - -  Alkaline Phos 38 - 126 U/L - - -  AST 15 - 41 U/L - - -  ALT 0 - 44 U/L - - -    Imaging studies:  EXAM: ABDOMEN - 2 VIEW  COMPARISON:  Radiographs of September 14, 2018.  FINDINGS: Distal tip of nasogastric tube is seen in proximal stomach. Moderate amount of stool is seen in the colon and rectum. No significant bowel dilatation is noted.  IMPRESSION: No definite evidence of bowel obstruction or ileus. Moderate  stool burden is noted.   Electronically Signed   By: Lupita Raider, M.D.   On: 09/16/2018 09:46  Assessment/Plan:  67 y.o.malewith high grade small bowel obstruction and pneumoniawith severe mental retardation.  Patient without sing of bowel function. Radiologist report no evidence of bowel obstruction but I see a significant loop of bowel dilation on the left upper quadrant. There is significant stool on large intestine. I agree to clamp the NGT and take ice chips but will not recommend taking the NGT today. Will repeat xray in the morning to see if there is increase in bowel dilation without NGT to suction. Will follow closely.   Gae Gallop, MD

## 2018-09-17 ENCOUNTER — Inpatient Hospital Stay: Payer: Medicare Other

## 2018-09-17 DIAGNOSIS — E440 Moderate protein-calorie malnutrition: Secondary | ICD-10-CM

## 2018-09-17 LAB — BASIC METABOLIC PANEL
Anion gap: 11 (ref 5–15)
BUN: 20 mg/dL (ref 8–23)
CALCIUM: 8.4 mg/dL — AB (ref 8.9–10.3)
CO2: 27 mmol/L (ref 22–32)
Chloride: 108 mmol/L (ref 98–111)
Creatinine, Ser: 0.68 mg/dL (ref 0.61–1.24)
Glucose, Bld: 131 mg/dL — ABNORMAL HIGH (ref 70–99)
Potassium: 3.6 mmol/L (ref 3.5–5.1)
SODIUM: 146 mmol/L — AB (ref 135–145)

## 2018-09-17 LAB — CULTURE, BLOOD (ROUTINE X 2)
Culture: NO GROWTH
SPECIAL REQUESTS: ADEQUATE

## 2018-09-17 NOTE — Progress Notes (Signed)
SURGICAL PROGRESS NOTE   Hospital Day(s): 5.   Post op day(s):  Marland Kitchen   Interval History: Patient seen and examined, no acute events or new complaints overnight. Nurse report no sing of bowel function. No sign of stool or passing gas.   Vital signs in last 24 hours: [min-max] current  Temp:  [97.5 F (36.4 C)-98.9 F (37.2 C)] 98.9 F (37.2 C) (10/23 0506) Pulse Rate:  [100-140] 106 (10/23 0506) Resp:  [16-20] 18 (10/23 0506) BP: (110-176)/(66-121) 110/82 (10/23 0506) SpO2:  [87 %-97 %] 95 % (10/23 0506)     Height: 5\' 11"  (180.3 cm) Weight: 62.5 kg     Intake/Output this shift:  Total I/O In: 1767.5 [I.V.:1257.5; IV Piggyback:510] Out: -    Intake/Output last 2 shifts:  @IOLAST2SHIFTS @   Physical Exam:  Constitutional: alert, cooperative and no distress  Respiratory: breathing non-labored at rest  Cardiovascular: regular rate and sinus rhythm  Gastrointestinal: soft, non-tender, and non-distended  Labs:  CBC Latest Ref Rng & Units 09/15/2018 09/14/2018 09/13/2018  WBC 4.0 - 10.5 K/uL 11.5(H) 14.8(H) 17.0(H)  Hemoglobin 13.0 - 17.0 g/dL 16.1 09.6 04.5  Hematocrit 39.0 - 52.0 % 45.1 46.0 47.2  Platelets 150 - 400 K/uL 203 232 250   CMP Latest Ref Rng & Units 09/17/2018 09/16/2018 09/15/2018  Glucose 70 - 99 mg/dL 409(W) 119(J) 478(G)  BUN 8 - 23 mg/dL 20 95(A) 21(H)  Creatinine 0.61 - 1.24 mg/dL 0.86 5.78 4.69  Sodium 135 - 145 mmol/L 146(H) 148(H) 150(H)  Potassium 3.5 - 5.1 mmol/L 3.6 3.5 3.4(L)  Chloride 98 - 111 mmol/L 108 109 111  CO2 22 - 32 mmol/L 27 29 30   Calcium 8.9 - 10.3 mg/dL 6.2(X) 5.2(W) 4.1(L)  Total Protein 6.5 - 8.1 g/dL - - -  Total Bilirubin 0.3 - 1.2 mg/dL - - -  Alkaline Phos 38 - 126 U/L - - -  AST 15 - 41 U/L - - -  ALT 0 - 44 U/L - - -    Imaging studies:  EXAM: ABDOMEN - 2 VIEW  COMPARISON:  09/16/2018  FINDINGS: Stable positioning of nasogastric tube extending into the proximal to mid stomach. There are some residual dilated  small bowel loops but air also has progressed into the colon and findings are consistent with stable residual ileus versus partial small bowel obstruction. No free air identified.  IMPRESSION: Stable residual ileus versus partial small bowel obstruction.   Electronically Signed   By: Irish Lack M.D.   On: 09/17/2018 11:25  Assessment/Plan:  67 y.o.malewith high grade small bowel obstruction and pneumoniawith severe mental retardation. Even though yesterday xray looks better, still with sing of small bowel dilation. Radiologist report ileus vs persistent obstruction. There is no sign of bowel function. I will recommend to place NGT to suction again to avoid re distention.  Continue treatment for pneumonia and medical conditions.   Gae Gallop, MD

## 2018-09-17 NOTE — Care Management Important Message (Signed)
Copy of signed IM left with patient in room.  

## 2018-09-17 NOTE — Progress Notes (Signed)
Initial Nutrition Assessment  DOCUMENTATION CODES:   Non-severe (moderate) malnutrition in context of social or environmental circumstances  INTERVENTION:   RD will monitor for diet advancement  Recommend TPN if unable to advance diet in the next day or so as pt without adequate nutrition for > 6 days.   NUTRITION DIAGNOSIS:   Moderate Malnutrition related to social / environmental circumstances(mental retardation ) as evidenced by mild fat to moderate fat depletions, moderate to severe muscle depletions.  GOAL:   Patient will meet greater than or equal to 90% of their needs  MONITOR:   Diet advancement, Labs, Weight trends, Skin, I & O's  REASON FOR ASSESSMENT:   NPO/Clear Liquid Diet    ASSESSMENT:   67 y.o. male with high grade small bowel obstruction and pneumonia; pt with h/o severe intellectual disability.     Visited pt's room today. Unable to obtain nutrition related history as pt with severe intellectual disability. Sitter at bedside. Pt with NGT in place. Per chart, pt with poor po intake since 10/17 and has been NPO since admit. Pt appears weight stable pta. Recommend TPN if unable to advance diet in the day or so as pt without adequate nutrition for > 6 days.   Medications reviewed and include: lovenox, unasyn, 5% dextrose w/ KCl @100ml /hr  Labs reviewed: Na 146(H), K 3.6 wnl Mg 2.4 wnl- 10/22  NUTRITION - FOCUSED PHYSICAL EXAM:    Most Recent Value  Orbital Region  No depletion  Upper Arm Region  Mild depletion  Thoracic and Lumbar Region  Mild depletion  Buccal Region  No depletion  Temple Region  Mild depletion  Clavicle Bone Region  Moderate depletion  Clavicle and Acromion Bone Region  Moderate depletion  Scapular Bone Region  Moderate depletion  Dorsal Hand  Moderate depletion  Patellar Region  Severe depletion  Anterior Thigh Region  Severe depletion  Posterior Calf Region  Severe depletion  Edema (RD Assessment)  None  Hair  Reviewed  Eyes   Reviewed  Mouth  Reviewed  Skin  Reviewed  Nails  Reviewed     Diet Order:   Diet Order            Diet NPO time specified Except for: Sips with Meds, Ice Chips  Diet effective now             EDUCATION NEEDS:   No education needs have been identified at this time  Skin:  Skin Assessment: Reviewed RN Assessment(Ecchymosis )  Last BM:  PTA  Height:   Ht Readings from Last 1 Encounters:  09/12/18 5\' 11"  (1.803 m)    Weight:   Wt Readings from Last 1 Encounters:  09/17/18 66 kg    Ideal Body Weight:  78.2 kg  BMI:  Body mass index is 20.28 kg/m.  Estimated Nutritional Needs:   Kcal:  1900-2200kcal/day   Protein:  85-94g/day   Fluid:  >1.6L/day  Betsey Holiday MS, RD, LDN Pager #- [phone] Office#- [phone] After Hours Pager: [phone]

## 2018-09-17 NOTE — Progress Notes (Signed)
[facility] - [place] at [facility]      PATIENT NAME: Richard Gillespie    MR#:  [id number]  DATE OF BIRTH:  02-Jan-1951  SUBJECTIVE:   Patient had some agitation yesterday but improved with some Haldol.  Abdominal x-ray this morning still showing evidence of ileus/small bowel obstruction.  As per surgery NG tube has been reconnected to intermittent suction.  Continue to keep the patient n.p.o. for now.  REVIEW OF SYSTEMS:    Review of Systems  Unable to perform ROS: Mental acuity    Nutrition: NPO Tolerating Diet: No Tolerating PT: Ambulatory  DRUG ALLERGIES:   Allergies  Allergen Reactions  . Zithromax [Azithromycin] Other (See Comments)    unknown    VITALS:  Blood pressure 138/83, pulse 98, temperature 98.2 F (36.8 C), temperature source Oral, resp. rate 20, height 5\' 11"  (1.803 m), weight 66 kg, SpO2 95 %.  PHYSICAL EXAMINATION:   Physical Exam  GENERAL:  67 y.o.-year-old patient lying in bed in NAD.  EYES: Pupils equal, round, reactive to light. No scleral icterus. Extraocular muscles intact.  HEENT: Head atraumatic, normocephalic. Oropharynx and nasopharynx clear. NG tube in place with bilious drainage noted.  NECK:  Supple, no jugular venous distention. No thyroid enlargement, no tenderness.  LUNGS: Normal breath sounds bilaterally, no wheezing, rales, rhonchi. No use of accessory muscles of respiration.  CARDIOVASCULAR: S1, S2 normal. No murmurs, rubs, or gallops.  ABDOMEN: Soft, nontender, Slightly distended. Bowel sounds Hypoactive. + mid-abdomen scar from previous surgery.  No organomegaly or mass.  EXTREMITIES: No cyanosis, clubbing or edema b/l.  NEUROLOGIC: Cranial nerves II through XII are intact. No focal Motor or sensory deficits b/l.  PSYCHIATRIC: The patient is alert and oriented x 1.  SKIN: No obvious rash, lesion, or ulcer.    LABORATORY PANEL:   CBC Recent Labs  Lab 09/15/18 0652  WBC 11.5*  HGB 14.5  HCT 45.1  PLT 203    ------------------------------------------------------------------------------------------------------------------  Chemistries  Recent Labs  Lab 09/12/18 1406  09/16/18 0634 09/17/18 0418  NA 137   < > 148* 146*  K 3.7   < > 3.5 3.6  CL 96*   < > 109 108  CO2 28   < > 29 27  GLUCOSE 148*   < > 107* 131*  BUN 27*   < > 24* 20  CREATININE 1.08   < > 0.80 0.68  CALCIUM 9.7   < > 8.6* 8.4*  MG  --   --  2.4  --   AST 44*  --   --   --   ALT 31  --   --   --   ALKPHOS 64  --   --   --   BILITOT 0.9  --   --   --    < > = values in this interval not displayed.   ------------------------------------------------------------------------------------------------------------------  Cardiac Enzymes No results for input(s): TROPONINI in the last 168 hours. ------------------------------------------------------------------------------------------------------------------  RADIOLOGY:  Dg Abd 2 Views  Result Date: 09/17/2018 CLINICAL DATA:  Small bowel obstruction. EXAM: ABDOMEN - 2 VIEW COMPARISON:  09/16/2018 FINDINGS: Stable positioning of nasogastric tube extending into the proximal to mid stomach. There are some residual dilated small bowel loops but air also has progressed into the colon and findings are consistent with stable residual ileus versus partial small bowel obstruction. No free air identified. IMPRESSION: Stable residual ileus versus partial small bowel obstruction. Electronically Signed   By: Rudene Anda.D.  On: 09/17/2018 11:25   Dg Abd 2 Views  Result Date: 09/16/2018 CLINICAL DATA:  Small bowel obstruction. EXAM: ABDOMEN - 2 VIEW COMPARISON:  Radiographs of September 14, 2018. FINDINGS: Distal tip of nasogastric tube is seen in proximal stomach. Moderate amount of stool is seen in the colon and rectum. No significant bowel dilatation is noted. IMPRESSION: No definite evidence of bowel obstruction or ileus. Moderate stool burden is noted. Electronically Signed   By:  Lupita Raider, M.D.   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  1.  Small bowel obstruction-this is the cause of patient's persistent nausea and vomiting.  Seen by general surgery no plans for surgical intervention -Status post NG tube decompression, NG tube was clamped yesterday but x-ray this morning still showing bowel dilatation evidence of ileus/partial SBO.  As per surgery NG tube has been reconnected to intermittent suction. - No evidence of resumption of bowel function yet.  Continue supportive care for now.  No plans for surgical intervention.  2.  Agitation-improved.  Cont. 1:1 sitter.   3.  Pneumonia-suspected to be aspiration pneumonia due to his recurrent nausea vomiting. -Patient's blood cultures were consistent with contamination and not likely a true pathogen.   - cont. Unasyn.  4.  Essential hypertension-continue clonidine.  5.  History of seizures- cannot take p.o. due to small bowel obstruction therefore hold Tegretol, Lamictal. - cont.  IV Keppra, PRN IV Ativan.  6.  Hypernatremia-secondary to dehydration and underlying small bowel obstruction. -Continue D5W and sodium is improving.   All the records are reviewed and case discussed with Care Management/Social Worker. Management plans discussed with the patient, family and they are in agreement.  CODE STATUS: Full code  DVT Prophylaxis: Lovenox  TOTAL TIME TAKING CARE OF THIS PATIENT: 30 minutes.   POSSIBLE D/C IN 2-3 DAYS, DEPENDING ON CLINICAL CONDITION.   Houston Siren M.D on 09/17/2018 at 2:33 PM  Between 7am to 6pm - Pager - [phone]  After 6pm go to [email or web address] - Social research officer, government  [facility] [place] [facility]  Office   [phone]  CC: Primary care physician; Gracelyn Nurse, MD

## 2018-09-18 ENCOUNTER — Inpatient Hospital Stay: Payer: Medicare Other

## 2018-09-18 LAB — CBC
HCT: 44.3 % (ref 39.0–52.0)
Hemoglobin: 14.6 g/dL (ref 13.0–17.0)
MCH: 29.7 pg (ref 26.0–34.0)
MCHC: 33 g/dL (ref 30.0–36.0)
MCV: 90 fL (ref 80.0–100.0)
Platelets: 237 K/uL (ref 150–400)
RBC: 4.92 MIL/uL (ref 4.22–5.81)
RDW: 12.2 % (ref 11.5–15.5)
WBC: 12.7 K/uL — ABNORMAL HIGH (ref 4.0–10.5)
nRBC: 0 % (ref 0.0–0.2)

## 2018-09-18 LAB — BASIC METABOLIC PANEL WITH GFR
Anion gap: 12 (ref 5–15)
BUN: 13 mg/dL (ref 8–23)
CO2: 24 mmol/L (ref 22–32)
Calcium: 8.4 mg/dL — ABNORMAL LOW (ref 8.9–10.3)
Chloride: 106 mmol/L (ref 98–111)
Creatinine, Ser: 0.61 mg/dL (ref 0.61–1.24)
GFR calc Af Amer: >60 mL/min
GFR calc non Af Amer: >60 mL/min
Glucose, Bld: 128 mg/dL — ABNORMAL HIGH (ref 70–99)
Potassium: 3.4 mmol/L — ABNORMAL LOW (ref 3.5–5.1)
Sodium: 142 mmol/L (ref 135–145)

## 2018-09-18 MED ORDER — POTASSIUM CL IN DEXTROSE 5% 20 MEQ/L IV SOLN
20.0000 meq | INTRAVENOUS | Status: AC
  Administered 2018-09-18 – 2018-09-19 (×2): 20 meq via INTRAVENOUS
  Filled 2018-09-18 (×3): qty 1000

## 2018-09-18 MED ORDER — POTASSIUM CHLORIDE 10 MEQ/100ML IV SOLN: 10.0000 meq | INTRAVENOUS | Status: DC

## 2018-09-18 MED ORDER — POTASSIUM CHLORIDE CRYS ER 20 MEQ PO TBCR
40.0000 meq | EXTENDED_RELEASE_TABLET | Freq: Once | ORAL | Status: AC
  Administered 2018-09-18: 40 meq via ORAL
  Filled 2018-09-18: qty 2

## 2018-09-18 NOTE — Progress Notes (Signed)
[facility] - [place] at [facility]      PATIENT NAME: Richard Gillespie    MR#:  [id number]  DATE OF BIRTH:  October 26, 1951  SUBJECTIVE:   Patient still without bowel movement  REVIEW OF SYSTEMS:    Review of Systems  Unable to perform ROS: Mental acuity    Nutrition: NPO Tolerating Diet: No Tolerating PT: Ambulatory  DRUG ALLERGIES:   Allergies  Allergen Reactions  . Zithromax [Azithromycin] Other (See Comments)    unknown    VITALS:  Blood pressure (!) 146/85, pulse 94, temperature 98.8 F (37.1 C), temperature source Oral, resp. rate (!) 24, height 5\' 11"  (1.803 m), weight 66 kg, SpO2 97 %.  PHYSICAL EXAMINATION:   Physical Exam  GENERAL:  67 y.o.-year-old patient lying in bed in NAD.  EYES: Pupils equal, round, reactive to light. No scleral icterus. Extraocular muscles intact.  HEENT: Head atraumatic, normocephalic. Oropharynx and nasopharynx clear. NG tube in place with bilious drainage noted.  NECK:  Supple, no jugular venous distention. No thyroid enlargement, no tenderness.  LUNGS: Normal breath sounds bilaterally, no wheezing, rales, rhonchi. No use of accessory muscles of respiration.  CARDIOVASCULAR: S1, S2 normal. No murmurs, rubs, or gallops.  ABDOMEN: Soft, nontender, Slightly distended. Bowel sounds Hypoactive. + mid-abdomen scar from previous surgery.  No organomegaly or mass.  EXTREMITIES: No cyanosis, clubbing or edema b/l.  NEUROLOGIC: Cranial nerves II through XII are intact. No focal Motor or sensory deficits b/l.  PSYCHIATRIC: The patient is alert and oriented x 1.  SKIN: No obvious rash, lesion, or ulcer.    LABORATORY PANEL:   CBC Recent Labs  Lab 09/18/18 0658  WBC 12.7*  HGB 14.6  HCT 44.3  PLT 237   ------------------------------------------------------------------------------------------------------------------  Chemistries  Recent Labs  Lab 09/12/18 1406  09/16/18 0634  09/18/18 0658  NA 137   < > 148*   < >  142  K 3.7   < > 3.5   < > 3.4*  CL 96*   < > 109   < > 106  CO2 28   < > 29   < > 24  GLUCOSE 148*   < > 107*   < > 128*  BUN 27*   < > 24*   < > 13  CREATININE 1.08   < > 0.80   < > 0.61  CALCIUM 9.7   < > 8.6*   < > 8.4*  MG  --   --  2.4  --   --   AST 44*  --   --   --   --   ALT 31  --   --   --   --   ALKPHOS 64  --   --   --   --   BILITOT 0.9  --   --   --   --    < > = values in this interval not displayed.   ------------------------------------------------------------------------------------------------------------------  Cardiac Enzymes No results for input(s): TROPONINI in the last 168 hours. ------------------------------------------------------------------------------------------------------------------  RADIOLOGY:  Dg Abd 2 Views  Result Date: 09/18/2018 CLINICAL DATA:  Small bowel obstruction. EXAM: ABDOMEN - 2 VIEW COMPARISON:  Radiographs of September 17, 2018. FINDINGS: Nasogastric tube is seen in expected position of proximal stomach. Stable elevated left hemidiaphragm is noted. Large amount of stool seen in the rectum. Colonic dilatation is noted in left upper quadrant which may represent focal ileus. Stable mildly dilated small bowel loops are noted. IMPRESSION: Stable  mildly dilated small bowel loops are noted which may represent ileus or obstruction. Dilated colon is seen in left upper quadrant concerning for ileus. Electronically Signed   By: Lupita Raider, M.D.   On: 09/18/2018 10:49   Dg Abd 2 Views  Result Date: 09/17/2018 CLINICAL DATA:  Small bowel obstruction. EXAM: ABDOMEN - 2 VIEW COMPARISON:  09/16/2018 FINDINGS: Stable positioning of nasogastric tube extending into the proximal to mid stomach. There are some residual dilated small bowel loops but air also has progressed into the colon and findings are consistent with stable residual ileus versus partial small bowel obstruction. No free air identified. IMPRESSION: Stable residual ileus versus partial  small bowel obstruction. Electronically Signed   By: Irish Lack M.D.   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  1.  Small bowel obstruction:  Seen by general surgery no plans for surgical intervention Continue management as per surgery. No signs of bowel function. May need to consider TPN for nutrition as patient has been n.p.o. since admission.  2.  Agitation: this has improved.   Continue sitter  3.  Aspiration pneumonia: Check x-ray this morning Continue Unasyn Patient's blood cultures were consistent with contamination and not likely a true pathogen.    4.  Essential hypertension-continue clonidine.  5.  History of seizures; He cannot take p.o. due to small bowel obstruction therefore hold Tegretol, Lamictal. Continue IV Keppra   6.  Hypernatremia: resolved. Continue D5 for now   7. Non-severe (moderate) malnutrition in context of social or environmental circumstances May need TPN as mentioned above or PPN.   All the records are reviewed and case discussed with Care Management/Social Worker. Management plans discussed with the patient's caretaker CODE STATUS: Full code  DVT Prophylaxis: Lovenox  TOTAL TIME TAKING CARE OF THIS PATIENT: 24 minutes.   POSSIBLE D/C IN 2-3 DAYS, DEPENDING ON CLINICAL CONDITION.   Yulonda Wheeling M.D on 09/18/2018 at 11:23 AM  Between 7am to 6pm - Pager - [phone]  After 6pm go to [email or web address] - Social research officer, government  [facility] [place] [facility]  Office  [phone]  CC: Primary care physician; Gracelyn Nurse, MD

## 2018-09-18 NOTE — Progress Notes (Signed)
SURGICAL PROGRESS NOTE   Hospital Day(s): 6.   Post op day(s):  Marland Kitchen   Interval History: Patient seen and examined, no acute events or new complaints overnight. Nurse reports no sign of bowel function (no flatus, no bowel movement). Nurse denies episode of vomitnig.  Vital signs in last 24 hours: [min-max] current  Temp:  [97.7 F (36.5 C)-98.8 F (37.1 C)] 98.8 F (37.1 C) (10/24 0602) Pulse Rate:  [94-105] 94 (10/24 0602) Resp:  [20-24] 24 (10/24 0602) BP: (134-146)/(81-85) 146/85 (10/24 0602) SpO2:  [94 %-97 %] 97 % (10/24 0602) Weight:  [66 kg] 66 kg (10/23 1245)     Height: 5\' 11"  (180.3 cm) Weight: 66 kg BMI (Calculated): 20.29   NGT: not charted  Physical Exam:  Constitutional: alert, cooperative and no distress  Respiratory: breathing non-labored at rest  Cardiovascular: regular rate and sinus rhythm  Gastrointestinal: soft, non-tender, and non-distended  Labs:  CBC Latest Ref Rng & Units 09/15/2018 09/14/2018 09/13/2018  WBC 4.0 - 10.5 K/uL 11.5(H) 14.8(H) 17.0(H)  Hemoglobin 13.0 - 17.0 g/dL 78.2 95.6 21.3  Hematocrit 39.0 - 52.0 % 45.1 46.0 47.2  Platelets 150 - 400 K/uL 203 232 250   CMP Latest Ref Rng & Units 09/17/2018 09/16/2018 09/15/2018  Glucose 70 - 99 mg/dL 086(V) 784(O) 962(X)  BUN 8 - 23 mg/dL 20 52(W) 41(L)  Creatinine 0.61 - 1.24 mg/dL 2.44 0.10 2.72  Sodium 135 - 145 mmol/L 146(H) 148(H) 150(H)  Potassium 3.5 - 5.1 mmol/L 3.6 3.5 3.4(L)  Chloride 98 - 111 mmol/L 108 109 111  CO2 22 - 32 mmol/L 27 29 30   Calcium 8.9 - 10.3 mg/dL 5.3(G) 6.4(Q) 0.3(K)  Total Protein 6.5 - 8.1 g/dL - - -  Total Bilirubin 0.3 - 1.2 mg/dL - - -  Alkaline Phos 38 - 126 U/L - - -  AST 15 - 41 U/L - - -  ALT 0 - 44 U/L - - -    Imaging studies:  Yesterday's xray shows persistent small bowel dilation.    Assessment/Plan:  67 y.o.malewith high grade small bowel obstruction and pneumoniawith severe mental retardation. Still no sign of bowel function. Physical  exam, unchanged, Will follow clinically and with abdominal xray.   Gae Gallop, MD

## 2018-09-19 ENCOUNTER — Inpatient Hospital Stay: Payer: Medicare Other

## 2018-09-19 ENCOUNTER — Inpatient Hospital Stay: Payer: Self-pay

## 2018-09-19 LAB — BASIC METABOLIC PANEL
Anion gap: 10 (ref 5–15)
BUN: 13 mg/dL (ref 8–23)
CALCIUM: 8.6 mg/dL — AB (ref 8.9–10.3)
CO2: 26 mmol/L (ref 22–32)
CREATININE: 0.69 mg/dL (ref 0.61–1.24)
Chloride: 104 mmol/L (ref 98–111)
GFR calc Af Amer: >60 mL/min (ref >60)
GLUCOSE: 128 mg/dL — AB (ref 70–99)
POTASSIUM: 3.3 mmol/L — AB (ref 3.5–5.1)
SODIUM: 140 mmol/L (ref 135–145)

## 2018-09-19 LAB — MAGNESIUM: Magnesium: 1.8 mg/dL (ref 1.7–2.4)

## 2018-09-19 LAB — PHOSPHORUS: PHOSPHORUS: 3.5 mg/dL (ref 2.5–4.6)

## 2018-09-19 LAB — GLUCOSE, CAPILLARY: Glucose-Capillary: 120 mg/dL — ABNORMAL HIGH (ref 70–99)

## 2018-09-19 MED ORDER — TRACE MINERALS CR-CU-MN-SE-ZN 10-1000-500-60 MCG/ML IV SOLN
INTRAVENOUS | Status: AC
  Administered 2018-09-19: 22:00:00 via INTRAVENOUS
  Filled 2018-09-19: qty 960

## 2018-09-19 MED ORDER — SODIUM CHLORIDE 0.9 % IV SOLN: INTRAVENOUS | Status: DC

## 2018-09-19 MED ORDER — FAT EMULSION PLANT BASED 20 % IV EMUL
250.0000 mL | INTRAVENOUS | Status: AC
  Administered 2018-09-19: 250 mL via INTRAVENOUS
  Filled 2018-09-19: qty 250

## 2018-09-19 MED ORDER — SODIUM CHLORIDE 0.9% FLUSH: 10.0000 mL | INTRAVENOUS | Status: DC | PRN

## 2018-09-19 MED ORDER — ORAL CARE MOUTH RINSE
15.0000 mL | Freq: Two times a day (BID) | OROMUCOSAL | Status: DC
  Administered 2018-09-19 – 2018-09-24 (×6): 15 mL via OROMUCOSAL

## 2018-09-19 MED ORDER — POTASSIUM CHLORIDE CRYS ER 20 MEQ PO TBCR
40.0000 meq | EXTENDED_RELEASE_TABLET | Freq: Once | ORAL | Status: AC
  Administered 2018-09-19: 40 meq via ORAL
  Filled 2018-09-19: qty 2

## 2018-09-19 MED ORDER — SODIUM CHLORIDE 0.9% FLUSH
10.0000 mL | Freq: Two times a day (BID) | INTRAVENOUS | Status: DC
  Administered 2018-09-19 – 2018-09-24 (×7): 10 mL

## 2018-09-19 MED ORDER — DIATRIZOATE MEGLUMINE & SODIUM 66-10 % PO SOLN
90.0000 mL | Freq: Once | ORAL | Status: AC
  Administered 2018-09-19: 90 mL via NASOGASTRIC

## 2018-09-19 MED ORDER — POTASSIUM CHLORIDE 10 MEQ/50ML IV SOLN
10.0000 meq | INTRAVENOUS | Status: AC
  Administered 2018-09-19 – 2018-09-20 (×2): 10 meq via INTRAVENOUS
  Filled 2018-09-19 (×3): qty 50

## 2018-09-19 MED ORDER — INSULIN ASPART 100 UNIT/ML ~~LOC~~ SOLN
0.0000 [IU] | Freq: Four times a day (QID) | SUBCUTANEOUS | Status: DC
  Administered 2018-09-20 – 2018-09-23 (×8): 1 [IU] via SUBCUTANEOUS
  Filled 2018-09-19 (×8): qty 1

## 2018-09-19 MED ORDER — CHLORHEXIDINE GLUCONATE 0.12 % MT SOLN
15.0000 mL | Freq: Two times a day (BID) | OROMUCOSAL | Status: DC
  Administered 2018-09-19 – 2018-09-24 (×8): 15 mL via OROMUCOSAL
  Filled 2018-09-19 (×9): qty 15

## 2018-09-19 NOTE — Consult Note (Signed)
PHARMACY - ADULT TOTAL PARENTERAL NUTRITION CONSULT NOTE   Pharmacy Consult for TPN management Indication: SBO  Patient Measurements: Height: 5\' 11"  (180.3 cm) Weight: 145 lb 6.4 oz (66 kg) IBW/kg (Calculated) : 75.3   Body mass index is 20.28 kg/m.  Assessment: 67 YOM w/ NGT in place with output x 24hrs without adequate nutrition for > 7 days. Plan for PICC line and TPN today. Pt at high refeeding risk. Plan to monitor electrolytes closely  GI: NG tube  Endo:  Insulin requirements in the past 24 hours: none Lytes: potassium slightly low at 3.3, will replace with IV KCl q2h x2 Magnesium and phosphorous are wnl at this time Renal: SCr<1 Pulm: recent asp PNA x days IV abx Cards:  Hepatobil: Neuro: ID:WBC 17>14.8>11.5>12.7 10/18: GI panel negative 10/18: Bcx: 1/4 GPR bacillus sp Vancomycin 10/18 x1 Cefepime 10/19 Unasyn 10/19>10/20 Unasyn 10/22>>10/25 Zosyn 10/20>>10/22  TPN Access: 09/19/18 TPN start date: 09/19/18 Nutritional Goals (per RD recommendation on 10/25): KCal: 1894 Protein: 100g Fluid: 2232 ml  Goal TPN rate is 75 ml/hr (provides 90% of nutritional needs)  Current Nutrition: NPO  Plan:  E 5/15 TPN at 40 mL/hr + 20% lipids at 93ml/hr x 12 hours This TPN provides 100 g of protein, 144 g of dextrose, and 48 g of lipids which provides 1010 kCals per day, meeting 48% of patient needs Electrolytes in TPN: none Add MVI, trace elements, 100mg  thiamine x 3 days Sensitive q6h SSI and adjust as needed NS IVMF at 35 ml/hr Monitor TPN labs daily F/U in am  Lowella Bandy, PharmD 09/19/2018,11:41 AM

## 2018-09-19 NOTE — Progress Notes (Signed)
SURGICAL PROGRESS NOTE   Hospital Day(s): 7.   Post op day(s):  Marland Kitchen   Interval History: Patient seen and examined, no acute events or new complaints overnight. Nurse reported at least 3 bowel movements.  Vital signs in last 24 hours: [min-max] current  Temp:  [97.7 F (36.5 C)] 97.7 F (36.5 C) (10/25 0527) Pulse Rate:  [97-103] 103 (10/25 0527) Resp:  [18] 18 (10/25 0527) BP: (122-131)/(74-88) 122/74 (10/25 0527) SpO2:  [95 %-96 %] 95 % (10/25 0527) Weight:  [63.7 kg] 63.7 kg (10/25 1312)     Height: 5\' 11"  (180.3 cm) Weight: 63.7 kg BMI (Calculated): 19.6   Physical Exam:  Constitutional: alert, cooperative and no distress  Respiratory: breathing non-labored at rest  Cardiovascular: regular rate and sinus rhythm  Gastrointestinal: soft, non-tender, and non-distended  Labs:  CBC Latest Ref Rng & Units 09/18/2018 09/15/2018 09/14/2018  WBC 4.0 - 10.5 K/uL 12.7(H) 11.5(H) 14.8(H)  Hemoglobin 13.0 - 17.0 g/dL 16.1 09.6 04.5  Hematocrit 39.0 - 52.0 % 44.3 45.1 46.0  Platelets 150 - 400 K/uL 237 203 232   CMP Latest Ref Rng & Units 09/19/2018 09/18/2018 09/17/2018  Glucose 70 - 99 mg/dL 409(W) 119(J) 478(G)  BUN 8 - 23 mg/dL 13 13 20   Creatinine 0.61 - 1.24 mg/dL 9.56 2.13 0.86  Sodium 135 - 145 mmol/L 140 142 146(H)  Potassium 3.5 - 5.1 mmol/L 3.3(L) 3.4(L) 3.6  Chloride 98 - 111 mmol/L 104 106 108  CO2 22 - 32 mmol/L 26 24 27   Calcium 8.9 - 10.3 mg/dL 5.7(Q) 4.6(N) 6.2(X)  Total Protein 6.5 - 8.1 g/dL - - -  Total Bilirubin 0.3 - 1.2 mg/dL - - -  Alkaline Phos 38 - 126 U/L - - -  AST 15 - 41 U/L - - -  ALT 0 - 44 U/L - - -    Imaging studies:  EXAM: ABDOMEN - 2 VIEW  COMPARISON:  09/18/2018, 09/17/2018, 09/16/2018  FINDINGS: Gaseous distention of transverse colon and splenic flexure, similar to the prior. Relative paucity of distal gas/rectal gas, similar to the prior. Decreased formed stool in the rectum. Surgical changes of the left abdomen. Gastric tube  terminates in the left upper quadrant. Small bowel gas without significant distention.  IMPRESSION: Dilated colon may represent obstruction or ileus. No significant dilation of small bowel loops.  Gastric tube unchanged.  Decreased formed stool in the rectum.   Electronically Signed   By: Gilmer Mor D.O.   On: 09/19/2018 11:21  Assessment/Plan:  67 y.o.malewith high grade small bowel obstruction and pneumoniawith severe mental retardation. Patient had 3 bowel movements but xray shows persistent colon dilation that may represent obstruction or ileus. Upon evaluation of previous xray, from 2018 patient has same splenic flexure colon dilation and the report says that similar finding were found in xray on 2001. Will follow gastrografin challenge and if patient continue with bowel function despite colon dilation will proceed to discontinue NGT.   Gae Gallop, MD

## 2018-09-19 NOTE — Progress Notes (Signed)
Patient had two loose bowel movements.  Richard Gillespie

## 2018-09-19 NOTE — Progress Notes (Addendum)
1510 09/16/2018  Updated brother on patients status. Per MD patient to receive PICC for TPN infusion due to NPO status. Patients brother Algernon Huxley requested Dr. Juliene Pina call him on his cell phone once she has assessed the second abdominal x-ray. I mentioned to the brother that the results may not be addressed by Dr. Juliene Pina until tomorrow. Sent secure message to Dr. Juliene Pina asking for her to call brother with update. Brother will be leaving to go out of town tomorrow.   Madie Reno, RN

## 2018-09-19 NOTE — Clinical Social Work Note (Signed)
CSW continues to follow. Patient is being started on TPN. From [facility]. To return if no colostomy/ileostomy and no TPN. Patient still has ng tube. York Spaniel MSW,LCSW [phone]

## 2018-09-19 NOTE — Progress Notes (Signed)
Family Meeting Note  Advance Directive:no  Today a meeting took place with the legal guardian his brother  Patient is unable to participate due [id number] capacity underlying mental condition   The following clinical team members were present during this meeting:MD  The following were discussed:Patient's diagnosis: SBO , Patient's progosis: Unable to determine and Goals for treatment: DNR  Additional follow-up to be provided: DNR order written in chart  Time spent during discussion:17 minutes  Kyree Adriano, MD

## 2018-09-19 NOTE — Care Management Important Message (Signed)
Copy of signed IM left with patient in room.  

## 2018-09-19 NOTE — Progress Notes (Signed)
[employer] - [place] at [facility]      PATIENT NAME: Richard Gillespie    MR#:  [id number]  DATE OF BIRTH:  December 11, 1950  SUBJECTIVE:   Patient still with abdominal distention REVIEW OF SYSTEMS:    Review of Systems  Unable to perform ROS: Mental acuity    Nutrition: NPO Tolerating Diet: No Tolerating PT: Ambulatory  DRUG ALLERGIES:   Allergies  Allergen Reactions  . Zithromax [Azithromycin] Other (See Comments)    unknown    VITALS:  Blood pressure 122/74, pulse (!) 103, temperature 97.7 F (36.5 C), resp. rate 18, height 5\' 11"  (1.803 m), weight 66 kg, SpO2 95 %.  PHYSICAL EXAMINATION:   Physical Exam  GENERAL:  67 y.o.-year-old patient lying in bed in NAD.  EYES: Pupils equal, round, reactive to light. No scleral icterus. Extraocular muscles intact.  HEENT: Head atraumatic, normocephalic. Oropharynx and nasopharynx clear. NG tube in place with bilious drainage noted.  NECK:  Supple, no jugular venous distention. No thyroid enlargement, no tenderness.  LUNGS: Normal breath sounds bilaterally, no wheezing, rales, rhonchi. No use of accessory muscles of respiration.  CARDIOVASCULAR: S1, S2 normal. No murmurs, rubs, or gallops.  ABDOMEN: Soft, nontender, Slightly distended. Bowel sounds Hypoactive. + mid-abdomen scar from previous surgery.  No organomegaly or mass.  EXTREMITIES: No cyanosis, clubbing or edema b/l.  NEUROLOGIC: Cranial nerves II through XII are intact. No focal Motor or sensory deficits b/l.  PSYCHIATRIC: The patient is alert and oriented x 1.  SKIN: No obvious rash, lesion, or ulcer.    LABORATORY PANEL:   CBC Recent Labs  Lab 09/18/18 0658  WBC 12.7*  HGB 14.6  HCT 44.3  PLT 237   ------------------------------------------------------------------------------------------------------------------  Chemistries  Recent Labs  Lab 09/12/18 1406  09/16/18 0634  09/19/18 0451  NA 137   < > 148*   < > 140  K 3.7   < > 3.5   < >  3.3*  CL 96*   < > 109   < > 104  CO2 28   < > 29   < > 26  GLUCOSE 148*   < > 107*   < > 128*  BUN 27*   < > 24*   < > 13  CREATININE 1.08   < > 0.80   < > 0.69  CALCIUM 9.7   < > 8.6*   < > 8.6*  MG  --   --  2.4  --   --   AST 44*  --   --   --   --   ALT 31  --   --   --   --   ALKPHOS 64  --   --   --   --   BILITOT 0.9  --   --   --   --    < > = values in this interval not displayed.   ------------------------------------------------------------------------------------------------------------------  Cardiac Enzymes No results for input(s): TROPONINI in the last 168 hours. ------------------------------------------------------------------------------------------------------------------  RADIOLOGY:  Dg Chest 1 View  Result Date: 09/18/2018 CLINICAL DATA:  Wheezing, small-bowel obstruction EXAM: CHEST  1 VIEW COMPARISON:  09/12/2018 FINDINGS: Cardiac shadow is within normal limits. Nasogastric catheter is noted within the stomach. Persistent gaseous dilatation of the colon is noted stable from the recent abdominal film. Persistent opacity is noted in the right lung base consistent with infiltrate. No new focal abnormality is noted. IMPRESSION: No significant change from the prior exam. Electronically Signed  By: Alcide Clever M.D.   On: 09/18/2018 13:45   Dg Abd 2 Views  Result Date: 09/18/2018 CLINICAL DATA:  Small bowel obstruction. EXAM: ABDOMEN - 2 VIEW COMPARISON:  Radiographs of September 17, 2018. FINDINGS: Nasogastric tube is seen in expected position of proximal stomach. Stable elevated left hemidiaphragm is noted. Large amount of stool seen in the rectum. Colonic dilatation is noted in left upper quadrant which may represent focal ileus. Stable mildly dilated small bowel loops are noted. IMPRESSION: Stable mildly dilated small bowel loops are noted which may represent ileus or obstruction. Dilated colon is seen in left upper quadrant concerning for ileus. Electronically  Signed   By: Lupita Raider, M.D.   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  1.  Small bowel obstruction:  Seen by general surgery no plans for surgical intervention at this time Continue management as per surgery. Needs PICC line and TPN to be started today   2.  Agitation: this has improved.   Continue sitter  3.  Aspiration pneumonia: She has been treated for 7 days with Unasyn and therefore will discontinue antibiotics today.  Patient's blood cultures were consistent with contamination and not likely a true pathogen.    4.  Essential hypertension-continue clonidine.  5.  History of seizures; He cannot take p.o. due to small bowel obstruction therefore hold Tegretol, Lamictal. Continue IV Keppra   6.  Hypernatremia: resolved. Continue D5 for now until TPN started.   7. Non-severe (moderate) malnutrition in context of social or environmental circumstances TPN to be started today 8.  Hypokalemia: Replete and recheck in a.m.  All the records are reviewed and case discussed with Care Management/Social Worker. Management plans discussed with the patient's caretaker CODE STATUS: Full code  DVT Prophylaxis: Lovenox  TOTAL TIME TAKING CARE OF THIS PATIENT: 24 minutes.   POSSIBLE D/C IN ??  SBO resolves, DEPENDING ON CLINICAL CONDITION.   Richard Gillespie M.D on 09/19/2018 at 11:01 AM  Between 7am to 6pm - Pager - [phone]  After 6pm go to [email or web address] - Social research officer, government  [facility]  Office  [phone]  CC: Primary care physician; Gracelyn Nurse, MD

## 2018-09-19 NOTE — Progress Notes (Addendum)
Nutrition Follow Up Note   DOCUMENTATION CODES:   Non-severe (moderate) malnutrition in context of social or environmental circumstances  INTERVENTION:   Initiate Clinimix 5/15 with electrolytes at 62m/hr + 20% lipids _0 /hr x 12 hrs/day (Goal rate 843mhr once labs stable)   Regimen at goal provides 1894kcal/day, 100g/day protein, 223258molume    Add MVI daily   Add trace elements daily    Add IV thiamine 100m63mily x 3 days    Pt at high refeeding risk; recommend check P, K, and Mg daily for 3 days after TPN iniatition.    Daily weights  Recommend change IVF to LRS with no additives and decrease rate to 35ml10me TPN starts   NUTRITION DIAGNOSIS:   Moderate Malnutrition related to social / environmental circumstances(mental retardation ) as evidenced by mild fat to moderate fat depletions, moderate to severe muscle depletions.  GOAL:   Patient will meet greater than or equal to 90% of their needs  -not met  MONITOR:   Diet advancement, Labs, Weight trends, Skin, I & O's  REASON FOR ASSESSMENT:   Consult New TPN/TNA  ASSESSMENT:   67 y.27 male with high grade small bowel obstruction and pneumonia; pt with h/o severe intellectual disability.     Pt with continued abdominal distension. NGT in place with 250ml 74mut x 24hrs. Pt did have some liquid stools today. Pt without adequate nutrition for > 7 days. Plan for PICC line and TPN today. Pt at high refeeding risk; recommend monitor electrolytes closely.   Medications reviewed and include: lovenox  Labs reviewed: K 3.3(L)  Diet Order:   Diet Order            Diet NPO time specified Except for: Sips with Meds, Ice Chips  Diet effective now             EDUCATION NEEDS:   No education needs have been identified at this time  Skin:  Skin Assessment: Reviewed RN Assessment(Ecchymosis )  Last BM:  10/25- type 7  Height:   Ht Readings from Last 1 Encounters:  09/12/18 _1  (1.803 m)    Weight:    Wt Readings from Last 1 Encounters:  09/17/18 66 kg    Ideal Body Weight:  78.2 kg  BMI:  Body mass index is 20.28 kg/m.  Estimated Nutritional Needs:   Kcal:  1900-2200kcal/day   Protein:  85-94g/day   Fluid:  >1.6L/day  Elijio Staples Koleen DistanceD, LDN Pager #- [phone]#- [phone] Hours Pager: [phone]

## 2018-09-19 NOTE — Progress Notes (Signed)
Peripherally Inserted Central Catheter/Midline Placement  The IV Nurse has discussed with the patient and/or persons authorized to consent for the patient, the purpose of this procedure and the potential benefits and risks involved with this procedure.  The benefits include less needle sticks, lab draws from the catheter, and the patient may be discharged home with the catheter. Risks include, but not limited to, infection, bleeding, blood clot (thrombus formation), and puncture of an artery; nerve damage and irregular heartbeat and possibility to perform a PICC exchange if needed/ordered by physician.  Alternatives to this procedure were also discussed.  Bard Power PICC patient education guide, fact sheet on infection prevention and patient information card has been provided to patient /or left at bedside.    PICC/Midline Placement Documentation  PICC Double Lumen 09/19/18 PICC Right Cephalic 39 cm 1 cm (Active)  Indication for Insertion or Continuance of Line Administration of hyperosmolar/irritating solutions (i.e. TPN, Vancomycin, etc.) 09/19/2018  4:00 PM  Exposed Catheter (cm) 0 cm 09/19/2018  4:00 PM  Site Assessment Clean;Dry;Intact 09/19/2018  4:00 PM  Lumen #1 Status Flushed;Blood return noted 09/19/2018  4:00 PM  Lumen #2 Status Flushed;Blood return noted 09/19/2018  4:00 PM  Dressing Type Transparent 09/19/2018  4:00 PM  Dressing Status Clean;Dry;Intact;Antimicrobial disc in place 09/19/2018  4:00 PM  Dressing Intervention New dressing 09/19/2018  4:00 PM  Dressing Change Due 09/26/18 09/19/2018  4:00 PM   Telephone consent by Foye Spurling 09/19/2018, 4:17 PM

## 2018-09-20 LAB — MAGNESIUM: MAGNESIUM: 2 mg/dL (ref 1.7–2.4)

## 2018-09-20 LAB — CBC
HEMATOCRIT: 39.1 % (ref 39.0–52.0)
Hemoglobin: 13.4 g/dL (ref 13.0–17.0)
MCH: 33.9 pg (ref 26.0–34.0)
MCHC: 34.3 g/dL (ref 30.0–36.0)
MCV: 99 fL (ref 80.0–100.0)
NRBC: 0 % (ref 0.0–0.2)
Platelets: 187 10*3/uL (ref 150–400)
RBC: 3.95 MIL/uL — AB (ref 4.22–5.81)
RDW: 12.4 % (ref 11.5–15.5)
WBC: 14.5 10*3/uL — ABNORMAL HIGH (ref 4.0–10.5)

## 2018-09-20 LAB — CBC WITH DIFFERENTIAL/PLATELET
Abs Immature Granulocytes: 0.13 10*3/uL — ABNORMAL HIGH (ref 0.00–0.07)
Basophils Absolute: 0.1 10*3/uL (ref 0.0–0.1)
Basophils Relative: 0 %
Eosinophils Absolute: 0.2 10*3/uL (ref 0.0–0.5)
Eosinophils Relative: 1 %
HCT: 39.5 % (ref 39.0–52.0)
HEMOGLOBIN: 13.5 g/dL (ref 13.0–17.0)
Immature Granulocytes: 1 %
LYMPHS PCT: 8 %
Lymphs Abs: 1.5 10*3/uL (ref 0.7–4.0)
MCH: 30.1 pg (ref 26.0–34.0)
MCHC: 34.2 g/dL (ref 30.0–36.0)
MCV: 88.2 fL (ref 80.0–100.0)
MONO ABS: 1.1 10*3/uL — AB (ref 0.1–1.0)
MONOS PCT: 6 %
NEUTROS ABS: 14.6 10*3/uL — AB (ref 1.7–7.7)
Neutrophils Relative %: 84 %
Platelets: 192 10*3/uL (ref 150–400)
RBC: 4.48 MIL/uL (ref 4.22–5.81)
RDW: 12.2 % (ref 11.5–15.5)
WBC: 17.5 10*3/uL — ABNORMAL HIGH (ref 4.0–10.5)
nRBC: 0 % (ref 0.0–0.2)

## 2018-09-20 LAB — PREALBUMIN: PREALBUMIN: 14.1 mg/dL — AB (ref 18–38)

## 2018-09-20 LAB — DIFFERENTIAL
ABS IMMATURE GRANULOCYTES: 0.1 10*3/uL — AB (ref 0.00–0.07)
Basophils Absolute: 0 10*3/uL (ref 0.0–0.1)
Basophils Relative: 0 %
Eosinophils Absolute: 0.1 10*3/uL (ref 0.0–0.5)
Eosinophils Relative: 1 %
IMMATURE GRANULOCYTES: 1 %
LYMPHS ABS: 1.2 10*3/uL (ref 0.7–4.0)
LYMPHS PCT: 8 %
Monocytes Absolute: 0.8 10*3/uL (ref 0.1–1.0)
Monocytes Relative: 6 %
NEUTROS ABS: 12.2 10*3/uL — AB (ref 1.7–7.7)
Neutrophils Relative %: 84 %

## 2018-09-20 LAB — BASIC METABOLIC PANEL
Anion gap: 8 (ref 5–15)
BUN: 13 mg/dL (ref 8–23)
CHLORIDE: 106 mmol/L (ref 98–111)
CO2: 25 mmol/L (ref 22–32)
Calcium: 8.3 mg/dL — ABNORMAL LOW (ref 8.9–10.3)
Creatinine, Ser: 0.65 mg/dL (ref 0.61–1.24)
GFR calc Af Amer: >60 mL/min (ref >60)
GFR calc non Af Amer: >60 mL/min (ref >60)
GLUCOSE: 120 mg/dL — AB (ref 70–99)
POTASSIUM: 3.9 mmol/L (ref 3.5–5.1)
Sodium: 139 mmol/L (ref 135–145)

## 2018-09-20 LAB — GLUCOSE, CAPILLARY
GLUCOSE-CAPILLARY: 115 mg/dL — AB (ref 70–99)
Glucose-Capillary: 123 mg/dL — ABNORMAL HIGH (ref 70–99)
Glucose-Capillary: 133 mg/dL — ABNORMAL HIGH (ref 70–99)
Glucose-Capillary: 142 mg/dL — ABNORMAL HIGH (ref 70–99)
Glucose-Capillary: 147 mg/dL — ABNORMAL HIGH (ref 70–99)

## 2018-09-20 LAB — TRIGLYCERIDES: Triglycerides: 78 mg/dL (ref <150)

## 2018-09-20 LAB — PHOSPHORUS: Phosphorus: 3.4 mg/dL (ref 2.5–4.6)

## 2018-09-20 MED ORDER — IPRATROPIUM-ALBUTEROL 0.5-2.5 (3) MG/3ML IN SOLN
3.0000 mL | Freq: Four times a day (QID) | RESPIRATORY_TRACT | Status: DC | PRN
  Administered 2018-09-20: 3 mL via RESPIRATORY_TRACT
  Filled 2018-09-20: qty 3

## 2018-09-20 MED ORDER — TRACE MINERALS CR-CU-MN-SE-ZN 10-1000-500-60 MCG/ML IV SOLN
INTRAVENOUS | Status: AC
  Administered 2018-09-20: 19:00:00 via INTRAVENOUS
  Filled 2018-09-20: qty 1992

## 2018-09-20 MED ORDER — FAT EMULSION PLANT BASED 20 % IV EMUL
250.0000 mL | INTRAVENOUS | Status: AC
  Administered 2018-09-20: 250 mL via INTRAVENOUS
  Filled 2018-09-20: qty 250

## 2018-09-20 MED ORDER — IPRATROPIUM-ALBUTEROL 0.5-2.5 (3) MG/3ML IN SOLN: 3.0000 mL | Freq: Four times a day (QID) | RESPIRATORY_TRACT | Status: DC

## 2018-09-20 MED ORDER — FUROSEMIDE 10 MG/ML IJ SOLN
20.0000 mg | Freq: Once | INTRAMUSCULAR | Status: AC
  Administered 2018-09-20: 20 mg via INTRAVENOUS
  Filled 2018-09-20: qty 4

## 2018-09-20 NOTE — Progress Notes (Signed)
SURGICAL PROGRESS NOTE   Hospital Day(s): 8.   Post op day(s):  Marland Kitchen   Interval History: Patient seen and examined, no acute events or new complaints overnight. Nurse reports continue bowel movements and tolerated clear liquid. Nurse denies nausea.  Vital signs in last 24 hours: [min-max] current  Temp:  [97.7 F (36.5 C)-100.1 F (37.8 C)] 97.7 F (36.5 C) (10/26 0439) Pulse Rate:  [96-102] 96 (10/26 0439) Resp:  [16-20] 16 (10/26 0439) BP: (98-154)/(78-98) 98/78 (10/26 0439) SpO2:  [94 %-98 %] 98 % (10/26 0439) Weight:  [63.7 kg-65.5 kg] 65.5 kg (10/26 0500)     Height: 5\' 11"  (180.3 cm) Weight: 65.5 kg BMI (Calculated): 20.15   Physical Exam:  Constitutional: alert, cooperative and no distress  Respiratory: breathing non-labored at rest  Cardiovascular: regular rate and sinus rhythm  Gastrointestinal: soft, non-tender, and non-distended  Labs:  CBC Latest Ref Rng & Units 09/20/2018 09/20/2018 09/18/2018  WBC 4.0 - 10.5 K/uL 17.5(H) 14.5(H) 12.7(H)  Hemoglobin 13.0 - 17.0 g/dL 78.2 95.6 21.3  Hematocrit 39.0 - 52.0 % 39.5 39.1 44.3  Platelets 150 - 400 K/uL 192 187 237   CMP Latest Ref Rng & Units 09/20/2018 09/19/2018 09/18/2018  Glucose 70 - 99 mg/dL 086(V) 784(O) 962(X)  BUN 8 - 23 mg/dL 13 13 13   Creatinine 0.61 - 1.24 mg/dL 5.28 4.13 2.44  Sodium 135 - 145 mmol/L 139 140 142  Potassium 3.5 - 5.1 mmol/L 3.9 3.3(L) 3.4(L)  Chloride 98 - 111 mmol/L 106 104 106  CO2 22 - 32 mmol/L 25 26 24   Calcium 8.9 - 10.3 mg/dL 8.3(L) 8.6(L) 8.4(L)  Total Protein 6.5 - 8.1 g/dL - - -  Total Bilirubin 0.3 - 1.2 mg/dL - - -  Alkaline Phos 38 - 126 U/L - - -  AST 15 - 41 U/L - - -  ALT 0 - 44 U/L - - -    Imaging studies:  EXAM: PORTABLE ABDOMEN - 1 VIEW  COMPARISON:  09/19/2018, 09/18/2018, 09/17/2017, CT 09/12/2018  FINDINGS: Partially visible esophageal tube in the upper abdomen. Slight increased diffuse gaseous dilatation of the bowel since radiograph earlier today.  Contrast is present within the colon and rectum. Some retained contrast within dilated small bowel on the right.  IMPRESSION: Slight interval increase in diffuse gaseous enlargement of the bowel since prior radiograph, however contrast appears to be present within the colon and rectum.   Electronically Signed   By: Jasmine Pang M.D.   On: 09/19/2018 20:00   Assessment/Plan:  67 y.o.malewith high grade small bowel obstruction and pneumoniawith severe mental retardation. Patient with persistent bowel movements. Yesterday's xray shows Gastrografin contrast in the rectum. The left upper quadrant dilation, as I reviewed his previous X-rays, this has been present for years. This is consistent with a chronic dilation and is not part of the acute small bowel obstruction. Patient tolerated clear liquids. Will advance to full liquid and assess for toleration. If he tolerates full liquid, TPN may be weaned.   Gae Gallop, MD

## 2018-09-20 NOTE — Progress Notes (Signed)
   09/20/18 1100  Clinical Encounter Type  Visited With Patient  Visit Type Initial;Spiritual support  Referral From  (Prayer request for patient and family.)  Recommendations Pray for family.  Spiritual Encounters  Spiritual Needs Other (Comment) (Prayer refused prayer.)  Stress Factors  Patient Stress Factors Not reviewed  Family Stress Factors Health changes   Chaplain responded to a request for prayer for the patient and his family. Though the patient has mental acuity challenges, he responded negatively to the word prayer. As Chaplain spoke about the prayer request the patient withdrew his hand and shook his head no. Chaplain assured the patient that this was fine and that his choice was honored.

## 2018-09-20 NOTE — Progress Notes (Signed)
Physical Therapy Evaluation Patient Details Name: EDMAR BLANKENBURG MRN: [id number] DOB: 1951-08-25 Today's Date: 09/20/2018   History of Present Illness  Wyatte Dames is a 67 y.o. male who presents with significant nausea and vomiting.  Patient has severe MR, and is unable to contribute any information to his history.  Clinical Impression  Patient needs mod assist for bed mobility supine to sit and sit to supine. He transfers sit to stand with mod assist and is not able to ambulate. He is non verbal and unable to rate his pain. He will continue to benefit from skilled PT to improve mobility. He had a sitter in the room and no family or friends available to gain prior level of function. Patient will continue to benefit from . Skilled PT to improve mobility.     Follow Up Recommendations SNF    Equipment Recommendations       Recommendations for Other Services       Precautions / Restrictions Precautions Precautions: None Restrictions Weight Bearing Restrictions: No      Mobility  Bed Mobility Overal bed mobility: (mod assist)                Transfers Overall transfer level: (mod assist)                  Ambulation/Gait Ambulation/Gait assistance: (unable)              Stairs            Wheelchair Mobility    Modified Rankin (Stroke Patients Only)       Balance                                             Pertinent Vitals/Pain      Home Living Family/patient expects to be discharged to:: Group home                 Additional Comments: (no family availiable to discuss dc plan)    Prior Function Level of Independence: (undetermined)               Hand Dominance   Dominant Hand: (undetermined)    Extremity/Trunk Assessment        Lower Extremity Assessment Lower Extremity Assessment: Generalized weakness       Communication      Cognition Arousal/Alertness: Awake/alert Behavior During  Therapy: WFL for tasks assessed/performed Overall Cognitive Status: History of cognitive impairments - at baseline                                 General Comments: (patient followed commands for mobility)      General Comments      Exercises     Assessment/Plan    PT Assessment    PT Problem List         PT Treatment Interventions      PT Goals (Current goals can be found in the Care Plan section)  Acute Rehab PT Goals Patient Stated Goal: (unable) PT Goal Formulation: Patient unable to participate in goal setting Time For Goal Achievement: 10/03/18 Potential to Achieve Goals: Fair    Frequency     Barriers to discharge        Co-evaluation  AM-PAC PT "6 Clicks" Daily Activity  Outcome Measure Difficulty turning over in bed (including adjusting bedclothes, sheets and blankets)?: A Lot Difficulty moving from lying on back to sitting on the side of the bed? : A Lot Difficulty sitting down on and standing up from a chair with arms (e.g., wheelchair, bedside commode, etc,.)?: A Lot Help needed moving to and from a bed to chair (including a wheelchair)?: A Lot Help needed walking in hospital room?: Total Help needed climbing 3-5 steps with a railing? : Total 6 Click Score: 10    End of Session Equipment Utilized During Treatment: Gait belt   Patient left: in bed;with call bell/phone within reach;with bed alarm set Nurse Communication: (sitter in room) PT Visit Diagnosis: Difficulty in walking, not elsewhere classified (R26.2)    Time: 1610-9604 PT Time Calculation (min) (ACUTE ONLY): 30 min   Charges:   PT Evaluation $PT Eval Low Complexity: 1 Low PT Treatments $Therapeutic Activity: 8-22 mins          Ezekiel Ina, PT DPT 09/20/2018, 1:53 PM

## 2018-09-20 NOTE — Consult Note (Signed)
PHARMACY - ADULT TOTAL PARENTERAL NUTRITION CONSULT NOTE   Pharmacy Consult for TPN management Indication: SBO  Patient Measurements: Height: 5\' 11"  (180.3 cm) Weight: 144 lb 6.4 oz (65.5 kg) IBW/kg (Calculated) : 75.3   Body mass index is 20.14 kg/m.  Assessment: 67 YOM w/ NGT in place with output x 24hrs without adequate nutrition for > 7 days. Plan for PICC line and TPN today. Pt at high refeeding risk. Plan to monitor electrolytes closely  GI: NG tube  Endo:  Insulin requirements in the past 24 hours:  2 Lytes: K 3.9 Mag 2.0  Phos 3.4  Ca 8.3 Magnesium and phosphorous are wnl at this time Renal: SCr<1 Pulm: recent asp PNA x days IV abx Cards:  Hepatobil: Neuro: ID:WBC 17>14.8>11.5>12.7 10/18: GI panel negative 10/18: Bcx: 1/4 GPR bacillus sp Vancomycin 10/18 x1 Cefepime 10/19 Unasyn 10/19>10/20 Unasyn 10/22>>10/25 Zosyn 10/20>>10/22  TPN Access: 09/19/18 TPN start date: 09/19/18 Nutritional Goals (per RD recommendation on 10/25): KCal: 1894 Protein: 100g Fluid: 2232 ml  Goal TPN rate is 75 ml/hr (provides 90% of nutritional needs) Per Dietician note 10/25- goal rate 83 ml/hr (Dietician called this am Sat 10/26 and confirmed goal rate of 83 ml/h)  Current Nutrition: NPO  Plan:  Clinimix E 5/15 TPN at 83 mL/hr + 20% lipids at 73ml/hr x 12 hours Electrolytes in TPN: none, no replacement needed at this time. Add MVI, trace elements, 100mg  thiamine x 3 days (10/26- Day 2) Sensitive q6h SSI and adjust as needed Monitor TPN labs daily F/U in am  Ahkeem Goede A, PharmD 09/20/2018,8:49 AM

## 2018-09-20 NOTE — Progress Notes (Signed)
Notified Dr Juliene Pina, pt coughing during and after drinking and sounds more congested. New orders placed.

## 2018-09-20 NOTE — Progress Notes (Signed)
[facility] -  at [facility]      PATIENT NAME: Richard Gillespie    MR#:  [id number]  DATE OF BIRTH:  [birth year]  SUBJECTIVE:   Bowel function has returned REVIEW OF SYSTEMS:    Review of Systems  Unable to perform ROS: Mental acuity      DRUG ALLERGIES:   Allergies  Allergen Reactions  . Zithromax [Azithromycin] Other (See Comments)    unknown    VITALS:  Blood pressure 98/78, pulse 96, temperature 97.7 F (36.5 C), temperature source Oral, resp. rate 16, height 5\' 11"  (1.803 m), weight 65.5 kg, SpO2 98 %.  PHYSICAL EXAMINATION:   Physical Exam  GENERAL:  67 y.o.-year-old patient lying in bed in NAD. NGT placed EYES: Pupils equal, round, reactive to light. No scleral icterus. Extraocular muscles intact.  HEENT: Head atraumatic, normocephalic. Oropharynx and nasopharynx clear. NG tube in place with bilious drainage noted.  NECK:  Supple, no jugular venous distention. No thyroid enlargement, no tenderness.  LUNGS: Normal breath sounds bilaterally, no wheezing, rales, rhonchi. No use of accessory muscles of respiration.  CARDIOVASCULAR: S1, S2 normal. No murmurs, rubs, or gallops.  ABDOMEN: Soft, nontender, Slightly distended. Bowel sounds present. + mid-abdomen scar from previous surgery.  No organomegaly or mass.  EXTREMITIES: No cyanosis, clubbing or edema b/l.  NEUROLOGIC: Cranial nerves II through XII are intact. No focal Motor or sensory deficits b/l.  PSYCHIATRIC: The patient is alert and oriented x 1.  SKIN: No obvious rash, lesion, or ulcer.    LABORATORY PANEL:   CBC Recent Labs  Lab 09/20/18 0658  WBC 17.5*  HGB 13.5  HCT 39.5  PLT 192   ------------------------------------------------------------------------------------------------------------------  Chemistries  Recent Labs  Lab 09/20/18 0658  NA 139  K 3.9  CL 106  CO2 25  GLUCOSE 120*  BUN 13  CREATININE 0.65  CALCIUM 8.3*  MG 2.0    ------------------------------------------------------------------------------------------------------------------  Cardiac Enzymes No results for input(s): TROPONINI in the last 168 hours. ------------------------------------------------------------------------------------------------------------------  RADIOLOGY:  Dg Chest 1 View  Result Date: 09/18/2018 CLINICAL DATA:  Wheezing, small-bowel obstruction EXAM: CHEST  1 VIEW COMPARISON:  09/12/2018 FINDINGS: Cardiac shadow is within normal limits. Nasogastric catheter is noted within the stomach. Persistent gaseous dilatation of the colon is noted stable from the recent abdominal film. Persistent opacity is noted in the right lung base consistent with infiltrate. No new focal abnormality is noted. IMPRESSION: No significant change from the prior exam. Electronically Signed   By: Alcide Clever M.D.   On: 09/18/2018 13:45   Dg Abd 2 Views  Result Date: 09/19/2018 CLINICAL DATA:  67 year old male with a history of small bowel obstruction EXAM: ABDOMEN - 2 VIEW COMPARISON:  09/18/2018, 09/17/2018, 09/16/2018 FINDINGS: Gaseous distention of transverse colon and splenic flexure, similar to the prior. Relative paucity of distal gas/rectal gas, similar to the prior. Decreased formed stool in the rectum. Surgical changes of the left abdomen. Gastric tube terminates in the left upper quadrant. Small bowel gas without significant distention. IMPRESSION: Dilated colon may represent obstruction or ileus. No significant dilation of small bowel loops. Gastric tube unchanged. Decreased formed stool in the rectum. Electronically Signed   By: Gilmer Mor D.O.   On: 09/19/2018 11:21   Dg Abd Portable 1v-small Bowel Obstruction Protocol-initial, 8 Hr Delay  Result Date: 09/19/2018 CLINICAL DATA:  8 hour delay SBO EXAM: PORTABLE ABDOMEN - 1 VIEW COMPARISON:  09/19/2018, 09/18/2018, 09/17/2017, CT 09/12/2018 FINDINGS: Partially visible esophageal tube in the  upper abdomen. Slight increased diffuse gaseous dilatation of the bowel since radiograph earlier today. Contrast is present within the colon and rectum. Some retained contrast within dilated small bowel on the right. IMPRESSION: Slight interval increase in diffuse gaseous enlargement of the bowel since prior radiograph, however contrast appears to be present within the colon and rectum. Electronically Signed   By: Jasmine Pang M.D.   On: 09/19/2018 20:00   [place] Ekg Site Rite  Result Date: 09/19/2018 If Site Rite image not attached, placement could not be confirmed due to current cardiac rhythm.    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  1.  Small bowel obstruction: Bowel function is returning. Gastrografin challenge yesterday shows contrast in the colon and rectum Management as per surgery TPN started   2.  Agitation: This has improved 3.  Aspiration pneumonia: He was treated with Unasyn for 7 days.   Patient's blood cultures were consistent with contamination and not likely a true pathogen.    4.  Essential hypertension: Continue clonidine.  5.  History of seizures: Restart Tegretol, Lamictal once he is able to take an oral intake.   Continue IV Keppra   6.  Hypernatremia: resolved. Continue D5 for now until TPN started.   7. Non-severe (moderate) malnutrition in context of social or environmental circumstances TPN has been started. 8.  Hypokalemia: replete PRN.  All the records are reviewed and case discussed with Care Management/Social Worker. Management plans discussed with the patient's brother yesterday   CODE STATUS: Full code  DVT Prophylaxis: Lovenox  TOTAL TIME TAKING CARE OF THIS PATIENT: 24 minutes.   POSSIBLE D/C IN ??  DEPENDING ON  CLINICAL CONDITION.   Ezma Rehm M.D on 09/20/2018 at 11:21 AM  Between 7am to 6pm - Pager - [phone]  After 6pm go to [email or web address] - Social research officer, government  [facility]  Hospitalists  Office  [phone]  CC: Primary care physician; Gracelyn Nurse, MD

## 2018-09-21 LAB — BASIC METABOLIC PANEL
Anion gap: 10 (ref 5–15)
BUN: 17 mg/dL (ref 8–23)
CALCIUM: 8.6 mg/dL — AB (ref 8.9–10.3)
CO2: 25 mmol/L (ref 22–32)
CREATININE: 0.55 mg/dL — AB (ref 0.61–1.24)
Chloride: 100 mmol/L (ref 98–111)
GFR calc Af Amer: >60 mL/min (ref >60)
GFR calc non Af Amer: >60 mL/min (ref >60)
GLUCOSE: 108 mg/dL — AB (ref 70–99)
Potassium: 4.2 mmol/L (ref 3.5–5.1)
Sodium: 135 mmol/L (ref 135–145)

## 2018-09-21 LAB — CBC
HCT: 41 % (ref 39.0–52.0)
Hemoglobin: 14 g/dL (ref 13.0–17.0)
MCH: 29.9 pg (ref 26.0–34.0)
MCHC: 34.1 g/dL (ref 30.0–36.0)
MCV: 87.4 fL (ref 80.0–100.0)
PLATELETS: 205 10*3/uL (ref 150–400)
RBC: 4.69 MIL/uL (ref 4.22–5.81)
RDW: 12.2 % (ref 11.5–15.5)
WBC: 18.6 10*3/uL — ABNORMAL HIGH (ref 4.0–10.5)
nRBC: 0 % (ref 0.0–0.2)

## 2018-09-21 LAB — PHOSPHORUS: Phosphorus: 4.1 mg/dL (ref 2.5–4.6)

## 2018-09-21 LAB — GLUCOSE, CAPILLARY
Glucose-Capillary: 108 mg/dL — ABNORMAL HIGH (ref 70–99)
Glucose-Capillary: 140 mg/dL — ABNORMAL HIGH (ref 70–99)
Glucose-Capillary: 116 mg/dL — ABNORMAL HIGH (ref 70–99)

## 2018-09-21 LAB — MAGNESIUM: MAGNESIUM: 2.1 mg/dL (ref 1.7–2.4)

## 2018-09-21 MED ORDER — MEDROXYPROGESTERONE ACETATE 10 MG PO TABS
10.0000 mg | ORAL_TABLET | Freq: Every day | ORAL | Status: DC
  Administered 2018-09-21 – 2018-09-24 (×4): 10 mg via ORAL
  Filled 2018-09-21 (×4): qty 1

## 2018-09-21 MED ORDER — TRACE MINERALS CR-CU-MN-SE-ZN 10-1000-500-60 MCG/ML IV SOLN
INTRAVENOUS | Status: DC
  Filled 2018-09-21: qty 1992

## 2018-09-21 MED ORDER — CARBAMAZEPINE ER 200 MG PO TB12
200.0000 mg | ORAL_TABLET | Freq: Four times a day (QID) | ORAL | Status: DC
  Administered 2018-09-21 – 2018-09-24 (×12): 200 mg via ORAL
  Filled 2018-09-21 (×16): qty 1

## 2018-09-21 MED ORDER — LEVETIRACETAM 500 MG PO TABS
500.0000 mg | ORAL_TABLET | Freq: Two times a day (BID) | ORAL | Status: DC
  Administered 2018-09-21 – 2018-09-24 (×7): 500 mg via ORAL
  Filled 2018-09-21 (×7): qty 1

## 2018-09-21 MED ORDER — LAMOTRIGINE 25 MG PO TABS
75.0000 mg | ORAL_TABLET | Freq: Two times a day (BID) | ORAL | Status: DC
  Administered 2018-09-21 – 2018-09-24 (×7): 75 mg via ORAL
  Filled 2018-09-21 (×7): qty 3

## 2018-09-21 MED ORDER — TRACE MINERALS CR-CU-MN-SE-ZN 10-1000-500-60 MCG/ML IV SOLN
INTRAVENOUS | Status: AC
  Administered 2018-09-21: 19:00:00 via INTRAVENOUS
  Filled 2018-09-21: qty 960

## 2018-09-21 MED ORDER — FAT EMULSION PLANT BASED 20 % IV EMUL
250.0000 mL | INTRAVENOUS | Status: AC
  Administered 2018-09-21: 250 mL via INTRAVENOUS
  Filled 2018-09-21: qty 250

## 2018-09-21 NOTE — Progress Notes (Signed)
[facility] - [place] at [facility]      PATIENT NAME: Richard Gillespie    MR#:  [id number]  DATE OF BIRTH:  June 24, 1951  SUBJECTIVE:   Patient tolerated diet  REVIEW OF SYSTEMS:    Review of Systems  Unable to perform ROS: Mental acuity      DRUG ALLERGIES:   Allergies  Allergen Reactions  . Zithromax [Azithromycin] Other (See Comments)    unknown    VITALS:  Blood pressure 107/72, pulse 99, temperature 98.1 F (36.7 C), temperature source Oral, resp. rate 16, height 5\' 11"  (1.803 m), weight 68.1 kg, SpO2 98 %.  PHYSICAL EXAMINATION:   Physical Exam  GENERAL:  67 y.o.-year-old patient lying in bed in NAD. NGT placed EYES: Pupils equal, round, reactive to light. No scleral icterus. Extraocular muscles intact.  HEENT: Head atraumatic, normocephalic. Oropharynx and nasopharynx clear. NG tube in place with bilious drainage noted.  NECK:  Supple, no jugular venous distention. No thyroid enlargement, no tenderness.  LUNGS: Normal breath sounds bilaterally, no wheezing, rales, rhonchi. No use of accessory muscles of respiration.  CARDIOVASCULAR: S1, S2 normal. No murmurs, rubs, or gallops.  ABDOMEN: Soft, nontender, Slightly distended. Bowel sounds present. + mid-abdomen scar from previous surgery.  No organomegaly or mass.  EXTREMITIES: No cyanosis, clubbing or edema b/l.  NEUROLOGIC: Cranial nerves II through XII are intact. No focal Motor or sensory deficits b/l.  PSYCHIATRIC: The patient is alert and oriented x 1.  SKIN: No obvious rash, lesion, or ulcer.    LABORATORY PANEL:   CBC Recent Labs  Lab 09/21/18 0600  WBC 18.6*  HGB 14.0  HCT 41.0  PLT 205   ------------------------------------------------------------------------------------------------------------------  Chemistries  Recent Labs  Lab 09/21/18 0600  NA 135  K 4.2  CL 100  CO2 25  GLUCOSE 108*  BUN 17  CREATININE 0.55*  CALCIUM 8.6*  MG 2.1    ------------------------------------------------------------------------------------------------------------------  Cardiac Enzymes No results for input(s): TROPONINI in the last 168 hours. ------------------------------------------------------------------------------------------------------------------  RADIOLOGY:  Dg Abd Portable 1v-small Bowel Obstruction Protocol-initial, 8 Hr Delay  Result Date: 09/19/2018 CLINICAL DATA:  8 hour delay SBO EXAM: PORTABLE ABDOMEN - 1 VIEW COMPARISON:  09/19/2018, 09/18/2018, 09/17/2017, CT 09/12/2018 FINDINGS: Partially visible esophageal tube in the upper abdomen. Slight increased diffuse gaseous dilatation of the bowel since radiograph earlier today. Contrast is present within the colon and rectum. Some retained contrast within dilated small bowel on the right. IMPRESSION: Slight interval increase in diffuse gaseous enlargement of the bowel since prior radiograph, however contrast appears to be present within the colon and rectum. Electronically Signed   By: Jasmine Pang M.D.   On: 09/19/2018 20:00   [place] Ekg Site Rite  Result Date: 09/19/2018 If Site Rite image not attached, placement could not be confirmed due to current cardiac rhythm.    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  1.  Small bowel obstruction: Bowel function has returned and is tolerating diet. Continue management as per surgery.  Likely may discontinue TPN today if he continues to show improvement  2.  Agitation: This has improved 3.  Aspiration pneumonia: He was treated with Unasyn for 7 days.   Patient's blood cultures were consistent with contamination and not likely a true pathogen.    4.  Essential hypertension: Continue clonidine.  5.  History of  seizures: Restart Tegretol, Lamictal    Continue  Keppra   6.  Hypernatremia: resolved.   7. Non-severe (moderate) malnutrition in context of social or environmental circumstances TPN has been started.   8.  Hypokalemia: replete PRN.  All the records are reviewed and case discussed with Care Management/Social Worker. Management plans discussed with the patient's brother yesterday   CODE STATUS: Full code  DVT Prophylaxis: Lovenox  TOTAL TIME TAKING CARE OF THIS PATIENT: 21 minutes.   POSSIBLE D/C IN ??  DEPENDING ON CLINICAL CONDITION.   Ludmilla Mcgillis M.D on 09/21/2018 at 10:27 AM  Between 7am to 6pm - Pager - [phone]  After 6pm go to [email or web address] - Social research officer, government  [facility] [place] [facility]  Office  [phone]  CC: Primary care physician; Gracelyn Nurse, MD

## 2018-09-21 NOTE — Consult Note (Addendum)
PHARMACY - ADULT TOTAL PARENTERAL NUTRITION CONSULT NOTE   Pharmacy Consult for TPN management Indication: SBO  Patient Measurements: Height: 5\' 11"  (180.3 cm) Weight: 150 lb 2.1 oz (68.1 kg) IBW/kg (Calculated) : 75.3   Body mass index is 20.94 kg/m.  Assessment: 67 YOM w/ NGT in place with output x 24hrs without adequate nutrition for > 7 days. Plan for PICC line and TPN today. Pt at high refeeding risk. Plan to monitor electrolytes closely  GI: Full liquid diet  Endo:  Insulin requirements in the past 24 hours:  2 Lytes: K 4.2 Mag 2.1  Phos 4.1  Ca 8.6 Magnesium and phosphorous are wnl at this time Renal: SCr<1 Pulm: recent asp PNA x days IV abx Cards:  Hepatobil: Neuro: ID:WBC 17>14.8>11.5>12.7 10/18: GI panel negative 10/18: Bcx: 1/4 GPR bacillus sp Vancomycin 10/18 x1 Cefepime 10/19 Unasyn 10/19>10/20 Unasyn 10/22>>10/25 Zosyn 10/20>>10/22  TPN Access: 09/19/18 TPN start date: 09/19/18 Nutritional Goals (per RD recommendation on 10/25): KCal: 1894 Protein: 100g Fluid: 2232 ml  Goal TPN rate is 75 ml/hr (provides 90% of nutritional needs) Per Dietician note 10/25- goal rate 83 ml/hr (Dietician called this am Sat 10/26 and confirmed goal rate of 83 ml/h)  Current Nutrition: NPO  Plan:  Per discussion with dietician will decrease TPN to 40 ml/hr to begin tapering off. Clinimix E 5/15 TPN at 40 mL/hr + 20% lipids at 70ml/hr x 12 hours Electrolytes in TPN: none, no replacement needed at this time. Add MVI, trace elements, 100mg  thiamine x 3 days (10/27- Day 3) Sensitive q6h SSI and adjust as needed Monitor TPN labs daily F/U in am  Kayson Tasker A, PharmD 09/21/2018,11:21 AM

## 2018-09-22 LAB — COMPREHENSIVE METABOLIC PANEL
ALT: 85 U/L — AB (ref 0–44)
ANION GAP: 10 (ref 5–15)
AST: 44 U/L — ABNORMAL HIGH (ref 15–41)
Albumin: 3.3 g/dL — ABNORMAL LOW (ref 3.5–5.0)
Alkaline Phosphatase: 44 U/L (ref 38–126)
BUN: 20 mg/dL (ref 8–23)
CHLORIDE: 101 mmol/L (ref 98–111)
CO2: 25 mmol/L (ref 22–32)
CREATININE: 0.57 mg/dL — AB (ref 0.61–1.24)
Calcium: 8.4 mg/dL — ABNORMAL LOW (ref 8.9–10.3)
GFR calc non Af Amer: >60 mL/min (ref >60)
Glucose, Bld: 117 mg/dL — ABNORMAL HIGH (ref 70–99)
POTASSIUM: 3.9 mmol/L (ref 3.5–5.1)
SODIUM: 136 mmol/L (ref 135–145)
Total Bilirubin: 0.6 mg/dL (ref 0.3–1.2)
Total Protein: 6.8 g/dL (ref 6.5–8.1)

## 2018-09-22 LAB — MAGNESIUM: Magnesium: 2 mg/dL (ref 1.7–2.4)

## 2018-09-22 LAB — DIFFERENTIAL
ABS IMMATURE GRANULOCYTES: 0.07 10*3/uL (ref 0.00–0.07)
BASOS ABS: 0 10*3/uL (ref 0.0–0.1)
BASOS PCT: 0 %
Eosinophils Absolute: 0.3 10*3/uL (ref 0.0–0.5)
Eosinophils Relative: 3 %
Immature Granulocytes: 1 %
Lymphocytes Relative: 12 %
Lymphs Abs: 1.4 10*3/uL (ref 0.7–4.0)
Monocytes Absolute: 0.7 10*3/uL (ref 0.1–1.0)
Monocytes Relative: 6 %
NEUTROS ABS: 9.3 10*3/uL — AB (ref 1.7–7.7)
Neutrophils Relative %: 78 %

## 2018-09-22 LAB — CBC
HCT: 38.2 % — ABNORMAL LOW (ref 39.0–52.0)
HEMOGLOBIN: 13.1 g/dL (ref 13.0–17.0)
MCH: 30 pg (ref 26.0–34.0)
MCHC: 34.3 g/dL (ref 30.0–36.0)
MCV: 87.4 fL (ref 80.0–100.0)
NRBC: 0 % (ref 0.0–0.2)
Platelets: 174 10*3/uL (ref 150–400)
RBC: 4.37 MIL/uL (ref 4.22–5.81)
RDW: 12 % (ref 11.5–15.5)
WBC: 11.8 10*3/uL — AB (ref 4.0–10.5)

## 2018-09-22 LAB — GLUCOSE, CAPILLARY
GLUCOSE-CAPILLARY: 115 mg/dL — AB (ref 70–99)
GLUCOSE-CAPILLARY: 112 mg/dL — AB (ref 70–99)
Glucose-Capillary: 115 mg/dL — ABNORMAL HIGH (ref 70–99)
Glucose-Capillary: 122 mg/dL — ABNORMAL HIGH (ref 70–99)

## 2018-09-22 LAB — PHOSPHORUS: Phosphorus: 4.1 mg/dL (ref 2.5–4.6)

## 2018-09-22 MED ORDER — TRACE MINERALS CR-CU-MN-SE-ZN 10-1000-500-60 MCG/ML IV SOLN
INTRAVENOUS | Status: AC
  Administered 2018-09-22: 19:00:00 via INTRAVENOUS
  Filled 2018-09-22: qty 1992

## 2018-09-22 MED ORDER — FAT EMULSION PLANT BASED 20 % IV EMUL
250.0000 mL | INTRAVENOUS | Status: AC
  Administered 2018-09-22: 250 mL via INTRAVENOUS
  Filled 2018-09-22: qty 250

## 2018-09-22 NOTE — Progress Notes (Signed)
[employer] [facility] - [place] at [facility]      PATIENT NAME: Richard Gillespie    MR#:  [id number]  DATE OF BIRTH:  02/17/1951  SUBJECTIVE:   Has not been eating.  Per sitter, has only eaten one bite of applesauce.  Refuses to lift his head and prefers to lay down.  REVIEW OF SYSTEMS:    Review of Systems  Unable to perform ROS: Mental acuity   DRUG ALLERGIES:   Allergies  Allergen Reactions  . Zithromax [Azithromycin] Other (See Comments)    unknown    VITALS:  Blood pressure 126/75, pulse 84, temperature 98.1 F (36.7 C), temperature source Axillary, resp. rate 18, height 5\' 11"  (1.803 m), weight 65.3 kg, SpO2 95 %.  PHYSICAL EXAMINATION:   Physical Exam  GENERAL:  67 y.o.-year-old patient lying in bed in NAD.  EYES: Pupils equal, round, reactive to light. No scleral icterus. Extraocular muscles intact.  HEENT: Head atraumatic, normocephalic. Oropharynx and nasopharynx clear.  Mildly dry mucous membranes. NECK:  Supple, no jugular venous distention. No thyroid enlargement, no tenderness.  LUNGS: Normal breath sounds bilaterally, no wheezing, rales, rhonchi. No use of accessory muscles of respiration.  CARDIOVASCULAR: RRR, S1, S2 normal. No murmurs, rubs, or gallops.  ABDOMEN: Soft, nontender, Slightly distended. Bowel sounds present. + mid-abdomen scar from previous surgery.  No organomegaly or mass.  EXTREMITIES: No cyanosis, clubbing or edema b/l.  NEUROLOGIC: Cranial nerves II through XII are intact. No focal motor or sensory deficits b/l.  PSYCHIATRIC: The patient is alert and oriented x 1.  SKIN: No obvious rash, lesion, or ulcer.    LABORATORY PANEL:   CBC Recent Labs  Lab 09/22/18 0629  WBC 11.8*  HGB 13.1  HCT 38.2*  PLT 174   ------------------------------------------------------------------------------------------------------------------  Chemistries  Recent Labs  Lab 09/22/18 0629  NA 136  K 3.9  CL 101  CO2 25  GLUCOSE 117*  BUN  20  CREATININE 0.57*  CALCIUM 8.4*  MG 2.0  AST 44*  ALT 85*  ALKPHOS 44  BILITOT 0.6   ------------------------------------------------------------------------------------------------------------------  Cardiac Enzymes No results for input(s): TROPONINI in the last 168 hours. ------------------------------------------------------------------------------------------------------------------  RADIOLOGY:  No results found.   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  1.  Small bowel obstruction: Bowel function has returned and is tolerating diet. -Surgery following-can be discharged home on soft diet -Will d/c TPN when patient taking better PO -SLP eval today  2.  Agitation: Improved. -Monitor  3.  Aspiration pneumonia: Resolved He was treated with Unasyn for 7 days.  Patient's blood cultures were consistent with contamination and not likely a true pathogen.    4.  Essential hypertension: Continue clonidine.  5.  History of seizures: No seizure-like activity here. Continue  Keppra, Tegretol, Lamictal  6.  Hypernatremia: resolved.  7. Non-severe (moderate) malnutrition in context of social or environmental circumstances -We will stop TPN when p.o. has improved  8.  Hypokalemia: replete PRN.  All the records are reviewed and case discussed with Care Management/Social Worker. Management plans discussed with the patient's brother yesterday   CODE STATUS: Full code  DVT Prophylaxis: Lovenox  TOTAL TIME TAKING CARE OF THIS PATIENT: 35 minutes.   POSSIBLE D/C IN 2-3 days  DEPENDING ON CLINICAL CONDITION.   Orpha Bur  D Mayo M.D on 09/22/2018 at 5:03 PM  Between 7am to 6pm - Pager - [phone]  After 6pm go to [email or web address] - Air traffic controller  [facility] [place] [facility]  Office  [phone]  CC: Primary care physician; Gracelyn Nurse, MD

## 2018-09-22 NOTE — Progress Notes (Signed)
SURGICAL PROGRESS NOTE   Hospital Day(s): 10.   Post op day(s):  Marland Kitchen   Interval History: Patient seen and examined, no acute events or new complaints overnight. Nurse from night shift refers patient did OK. Refers has tolerated diet without nausea or vomiting but coughing a lot.   Vital signs in last 24 hours: [min-max] current  Temp:  [98 F (36.7 C)-99.4 F (37.4 C)] 99.4 F (37.4 C) (10/28 0522) Pulse Rate:  [85-102] 85 (10/28 0522) Resp:  [14-18] 14 (10/28 0522) BP: (115-130)/(66-92) 115/92 (10/28 0522) SpO2:  [93 %-99 %] 99 % (10/28 0522) Weight:  [65.3 kg] 65.3 kg (10/28 0557)     Height: 5\' 11"  (180.3 cm) Weight: 65.3 kg BMI (Calculated): 20.09    Physical Exam:  Constitutional: alert, cooperative and no distress  Respiratory: breathing non-labored at rest  Cardiovascular: regular rate and sinus rhythm  Gastrointestinal: soft, non-tender, and non-distended  Labs:  CBC Latest Ref Rng & Units 09/22/2018 09/21/2018 09/20/2018  WBC 4.0 - 10.5 K/uL 11.8(H) 18.6(H) 17.5(H)  Hemoglobin 13.0 - 17.0 g/dL 16.1 09.6 04.5  Hematocrit 39.0 - 52.0 % 38.2(L) 41.0 39.5  Platelets 150 - 400 K/uL 174 205 192   CMP Latest Ref Rng & Units 09/22/2018 09/21/2018 09/20/2018  Glucose 70 - 99 mg/dL 409(W) 119(J) 478(G)  BUN 8 - 23 mg/dL 20 17 13   Creatinine 0.61 - 1.24 mg/dL 9.56(O) 1.30(Q) 6.57  Sodium 135 - 145 mmol/L 136 135 139  Potassium 3.5 - 5.1 mmol/L 3.9 4.2 3.9  Chloride 98 - 111 mmol/L 101 100 106  CO2 22 - 32 mmol/L 25 25 25   Calcium 8.9 - 10.3 mg/dL 8.4(O) 9.6(E) 8.3(L)  Total Protein 6.5 - 8.1 g/dL 6.8 - -  Total Bilirubin 0.3 - 1.2 mg/dL 0.6 - -  Alkaline Phos 38 - 126 U/L 44 - -  AST 15 - 41 U/L 44(H) - -  ALT 0 - 44 U/L 85(H) - -    Imaging studies: No new pertinent imaging studies   Assessment/Plan:  67 y.o.malewith high grade small bowel obstruction and pneumoniawith severe mental retardation. Today tolerating full liquid diet but with significant cough.  Speech and Swallow consult in place. From surgery stand point, patient can be advance to soft diet and TPN discontinued if he tolerates. If he eats better the soft diet being solid, he can stay in soft diet and discharged home from SBO standpoint.   Gae Gallop, MD

## 2018-09-22 NOTE — Evaluation (Signed)
Clinical/Bedside Swallow Evaluation Patient Details  Name: Richard Gillespie MRN: [id number] Date of Birth: [birth year]  Today's Date: 09/22/2018 Time: SLP Start Time (ACUTE ONLY): 0850 SLP Stop Time (ACUTE ONLY): 0914 SLP Time Calculation (min) (ACUTE ONLY): 24 min  Past Medical History:  Past Medical History:  Diagnosis Date  . Mental retardation   . Seizures (HCC)    Past Surgical History:  Past Surgical History:  Procedure Laterality Date  . COLON SURGERY     HPI:  Per admitting H & P: Richard Gillespie  is a 67 y.o. male who presents with chief complaint as above.  Patient presents with caretakers with significant nausea and vomiting.  Patient has severe MR, and is unable to contribute any information to his history.  Here in the ED evaluation showed high-grade small bowel obstruction.  He also has right lower lobe pneumonia.  Surgery was contacted by ED physician and recommended NG tube.  They will follow along in the case, and hospitalist were called for admission   Assessment / Plan / Recommendation Clinical Impression   Very limited bedside evaluation performed due to adamant patient refusal to take trial PO's. Repeated attempts were made to complete evaluation, offering a few tsp's puree early this morning and returning an hour and half later to re-attempt full evaluation without success. Patient became agitated and made motions and verbalizations to command SLP put trial PO's on counter. Agitation would increase if SLP re-attempted to give patient any PO's, patient's verbalizations would grow louder and he moved in bed as if to get out and show SLP what to do with PO's. Patient did tolerate 4 tsp's puree with no overt s/s aspiration, demonstrating min to mod oral prep difficulties and delayed A-P transit. Vocal quality remained clear throughout evaluation. Laryngeal elevation appeared reduced; however patient noted with microcephaly, this may be baseline. Noted significant larger lower lip  that appears swollen, no family or caregivers available to determine if this is pre-morbid. Patient is reported to be refusing PO's. Patient is unlikely to obtain adequate nutrition by mouth if patient continues to refuse PO's. MD may consider alternative means of nutrition. Recommend continue with current diet. If s/s aspiration observed, recommend nursing make patient NPO until further assessment can be completed. Recommend give meds crushed in puree. Strict aspiration precautions. SLP to f/u with toleration of current diet and assess swallow as able. SLP Visit Diagnosis: Dysphagia, unspecified (R13.10)    Aspiration Risk  Moderate aspiration risk;Risk for inadequate nutrition/hydration    Diet Recommendation Thin liquid;Other (Comment)(SOFT)   Liquid Administration via: Cup Medication Administration: Crushed with puree Supervision: Full supervision/cueing for compensatory strategies Compensations: Minimize environmental distractions;Slow rate;Small sips/bites Postural Changes: Seated upright at 90 degrees;Remain upright for at least 30 minutes after po intake    Other  Recommendations Oral Care Recommendations: Oral care BID;Staff/trained caregiver to provide oral care   Follow up Recommendations        Frequency and Duration min 4x/week  1 week       Prognosis Prognosis for Safe Diet Advancement: Fair Barriers to Reach Goals: Cognitive deficits;Behavior      Swallow Study   General Date of Onset: 09/22/18 HPI: Per admitting H & P: Richard Gillespie  is a 67 y.o. male who presents with chief complaint as above.  Patient presents with caretakers with significant nausea and vomiting.  Patient has severe MR, and is unable to contribute any information to his history.  Here in the ED evaluation showed high-grade small bowel  obstruction.  He also has right lower lobe pneumonia.  Surgery was contacted by ED physician and recommended NG tube.  They will follow along in the case, and hospitalist  were called for admission Type of Study: Bedside Swallow Evaluation Diet Prior to this Study: Other (Comment)(SOFT with thin liquids) Temperature Spikes Noted: Yes Respiratory Status: Room air History of Recent Intubation: No Behavior/Cognition: Alert;Agitated;Impulsive;Uncooperative;Requires cueing;Doesn't follow directions Oral Cavity Assessment: Other (comment)(Largely unable to view due to pt refusal) Oral Care Completed by SLP: No Oral Cavity - Dentition: Adequate natural dentition Self-Feeding Abilities: Total assist Patient Positioning: Upright in bed Baseline Vocal Quality: Hoarse Volitional Cough: Cognitively unable to elicit Volitional Swallow: Unable to elicit    Oral/Motor/Sensory Function Overall Oral Motor/Sensory Function: Moderate impairment(Limited assessment) Facial ROM: Within Functional Limits Facial Symmetry: Other (Comment)(Significantly larger lower vs upper lip, appears swollen) Facial Strength: Reduced right;Reduced left   Ice Chips Ice chips: Not tested   Thin Liquid Thin Liquid: Not tested(Pt refused)    Nectar Thick Nectar Thick Liquid: Not tested   Honey Thick Honey Thick Liquid: Not tested   Puree Puree: Impaired Presentation: Spoon Oral Phase Impairments: Reduced lingual movement/coordination Oral Phase Functional Implications: Prolonged oral transit;Oral residue Pharyngeal Phase Impairments: Decreased hyoid-laryngeal movement   Solid     Solid: Not tested      Kristle Wesch , MA, CCC-SLP 09/22/2018,1:19 PM

## 2018-09-22 NOTE — Progress Notes (Signed)
PT Cancellation Note  Patient Details Name: Richard Gillespie MRN: [id number] DOB: [birth year]   Cancelled Treatment:    Reason Eval/Treat Not Completed: Patient declined, no reason specified.  PT attempted to see pt for therapy but pt adamantly refusing (pt grabbing therapists hand and pushing her towards the door).  Therapist tried multiple times and different ways but pt adamantly refusing to participate in physical therapy.  Nurse notified.  Will re-attempt PT treatment session at a later date/time.  Hendricks Limes, PT 09/22/18, 3:05 PM [phone]

## 2018-09-22 NOTE — Progress Notes (Signed)
Dr Caryn Bee notified 1 unit insukin not given due to pt finally resting after restlessness

## 2018-09-22 NOTE — Care Management Important Message (Signed)
Copy of signed IM left with patient in room.  

## 2018-09-22 NOTE — Consult Note (Signed)
PHARMACY - ADULT TOTAL PARENTERAL NUTRITION CONSULT NOTE   Pharmacy Consult for TPN management Indication: SBO  Patient Measurements: Height: 5\' 11"  (180.3 cm) Weight: 143 lb 15.4 oz (65.3 kg) IBW/kg (Calculated) : 75.3   Body mass index is 20.08 kg/m.  Assessment: 67 YOM without adequate nutrition for > 7 days. Pt at high refeeding risk. Patient's diet has been advanced but he is refusing to eat.  GI: Full liquid diet  Endo:  Insulin requirements in the past 24 hours:  1 unit Lytes: K 3.9 Mag 2.0  Phos 4.1 Magnesium, potassium and phosphorous are wnl at this time Renal: SCr<1 Pulm: recent asp PNA x 7 days IV abx Cards:  Hepatobil: Neuro: ID: 10/18: GI panel negative 10/18: Bcx: 1/4 GPR bacillus sp Vancomycin 10/18 x1 Cefepime 10/19 Unasyn 10/19>10/20 Unasyn 10/22>>10/25 Zosyn 10/20>>10/22  TPN Access: 09/19/18 TPN start date: 09/19/18 Nutritional Goals (per RD recommendation on 10/25): KCal: 1894 Protein: 100g Fluid: 1992 ml  Goal TPN rate is 83 ml/hr (provides 90% of nutritional needs) Per Dietician note 10/25- goal rate 83 ml/hr (Dietician called this am Sat 10/26 and confirmed goal rate of 83 ml/h)  Current Nutrition: NPO  Plan:  Per discussion with dietician will increase TPN back to 83 ml/hrClinimix E 5/15 TPN at 14mL/hr + 20% lipids at 4ml/hr x 12 hours Electrolytes in TPN: none, no replacement needed at this time. Add MVI, trace elements Sensitive q6h SSI and adjust as needed Monitor TPN labs daily F/U in am  Lowella Bandy, PharmD 09/22/2018,11:42 AM

## 2018-09-23 LAB — BASIC METABOLIC PANEL
ANION GAP: 8 (ref 5–15)
BUN: 22 mg/dL (ref 8–23)
CALCIUM: 8.2 mg/dL — AB (ref 8.9–10.3)
CO2: 27 mmol/L (ref 22–32)
Chloride: 102 mmol/L (ref 98–111)
Creatinine, Ser: 0.6 mg/dL — ABNORMAL LOW (ref 0.61–1.24)
GFR calc Af Amer: >60 mL/min (ref >60)
GLUCOSE: 137 mg/dL — AB (ref 70–99)
Potassium: 3.8 mmol/L (ref 3.5–5.1)
Sodium: 137 mmol/L (ref 135–145)

## 2018-09-23 LAB — CBC
HEMATOCRIT: 39.1 % (ref 39.0–52.0)
Hemoglobin: 13.1 g/dL (ref 13.0–17.0)
MCH: 29.2 pg (ref 26.0–34.0)
MCHC: 33.5 g/dL (ref 30.0–36.0)
MCV: 87.3 fL (ref 80.0–100.0)
NRBC: 0 % (ref 0.0–0.2)
Platelets: 179 10*3/uL (ref 150–400)
RBC: 4.48 MIL/uL (ref 4.22–5.81)
RDW: 12 % (ref 11.5–15.5)
WBC: 13.5 10*3/uL — ABNORMAL HIGH (ref 4.0–10.5)

## 2018-09-23 LAB — GLUCOSE, CAPILLARY
GLUCOSE-CAPILLARY: 121 mg/dL — AB (ref 70–99)
Glucose-Capillary: 128 mg/dL — ABNORMAL HIGH (ref 70–99)
Glucose-Capillary: 141 mg/dL — ABNORMAL HIGH (ref 70–99)
Glucose-Capillary: 150 mg/dL — ABNORMAL HIGH (ref 70–99)

## 2018-09-23 MED ORDER — INSULIN ASPART 100 UNIT/ML ~~LOC~~ SOLN
0.0000 [IU] | Freq: Three times a day (TID) | SUBCUTANEOUS | Status: DC
  Administered 2018-09-23 – 2018-09-24 (×2): 1 [IU] via SUBCUTANEOUS
  Filled 2018-09-23 (×2): qty 1

## 2018-09-23 MED ORDER — TRACE MINERALS CR-CU-MN-SE-ZN 10-1000-500-60 MCG/ML IV SOLN
INTRAVENOUS | Status: DC
  Administered 2018-09-23: 19:00:00 via INTRAVENOUS
  Filled 2018-09-23: qty 1992

## 2018-09-23 MED ORDER — FAT EMULSION PLANT BASED 20 % IV EMUL
250.0000 mL | INTRAVENOUS | Status: AC
  Administered 2018-09-23: 250 mL via INTRAVENOUS
  Filled 2018-09-23: qty 250

## 2018-09-23 NOTE — Progress Notes (Signed)
Nutrition Follow Up Note   DOCUMENTATION CODES:   Non-severe (moderate) malnutrition in context of social or environmental circumstances  INTERVENTION:   Recommend continue Clinimix 5/15 with electrolytes at 57m/hr until patient is able to meet at least 60% of his estimated needs via oral intake.   Continue 20% lipids @20ml /hr x 12 hrs/day   Continue daily MVI and trace elements   Add Magic cup TID with meals, each supplement provides 290 kcal and 9 grams of protein  Add Vital Cuisine TID, each supplement provides 520kcal and 22g of protein.   Pt may benefit from appetite stimulant; recommend megace 3048mpo BID   NUTRITION DIAGNOSIS:   Moderate Malnutrition related to social / environmental circumstances(mental retardation ) as evidenced by mild fat to moderate fat depletions, moderate to severe muscle depletions.  GOAL:   Patient will meet greater than or equal to 90% of their needs  -met with TPN  MONITOR:   PO intake, Supplement acceptance, Labs, Weight trends, Skin, I & O's, Other (Comment)(TPN)  ASSESSMENT:   [age].o. male with high grade small bowel obstruction and pneumonia; pt with h/o severe intellectual disability.     Pt continues on goal rate of TPN; tolerating well. NGT removed and patient advanced to a soft diet. Pt with poor appetite and oral intake; pt has been taking bites of meals but did eat 50% of his breakfast this morning. Unsure if patient is clinically not feeling well or if his poor intake is r/t patient's mental illness and discomfort with his surroundings. RD will add supplements to meal trays to try and encourage po intake. Pt may benefit from an appetite stimulant. Pt has not has any BM in two days; unsure if patient is passing any flatus. Recommend continue TPN until patient is able to meet at least 60% of his estimated needs via oral intake. Per chart, pt is weight stable.   Medications reviewed and include: lovenox, insulin  Labs reviewed: K  3.8 wnl, creat 0.60(L) P 4.1 wnl, Mg 2.0 wnl- 10/28 Triglycerides 78- 10/26  wbc- 13.5(H) cbgs- 115, 115, 112, 128, 141 x 24 hrs  Diet Order:   Diet Order            DIET SOFT Room service appropriate? Yes; Fluid consistency: Thin  Diet effective now             EDUCATION NEEDS:   No education needs have been identified at this time  Skin:  Skin Assessment: Reviewed RN Assessment(Ecchymosis )  Last BM:  10/27- type 7  Height:   Ht Readings from Last 1 Encounters:  09/12/18 5' 11"  (1.803 m)    Weight:   Wt Readings from Last 1 Encounters:  09/23/18 65.3 kg    Ideal Body Weight:  78.2 kg  BMI:  Body mass index is 20.08 kg/m.  Estimated Nutritional Needs:   Kcal:  1900-2200kcal/day   Protein:  85-94g/day   Fluid:  >1.6L/day  CaKoleen DistanceS, RD, LDN Pager #- [phone]#- [phone] Hours Pager: [phone]

## 2018-09-23 NOTE — Progress Notes (Signed)
Physical Therapy Treatment Patient Details Name: Richard Gillespie MRN: [id number] DOB: 01-06-1951 Today's Date: 09/23/2018    History of Present Illness Pt is a 67 y.o. male who presents with caretakers with significant nausea and vomiting.  Patient has severe MR, and is unable to contribute any information to his history.    PT Comments    Nursing reports pt ambulated at least 2 laps around nursing station earlier today with 2 staff from his group home.  Pt requiring some encouragement from PT and from sitter in room but pt then agreeable to ambulating 1 lap around nursing station (CGA x2 for safety with occasional min assist to steady); forward L lean noted during ambulation.  PT POC updated and SW notified.  Anticipate pt will do best in familiar environment with familiar staff (no further PT needs anticipated upon hospital discharge).  Recommend 24/7 physical assist for safety with mobility upon hospital discharge.    Follow Up Recommendations  Supervision/Assistance - 24 hour;No PT follow up     Equipment Recommendations  None recommended by PT    Recommendations for Other Services       Precautions / Restrictions Precautions Precautions: Fall Precaution Comments: Seizure precautions; elevate HOB; aspiration Restrictions Weight Bearing Restrictions: No    Mobility  Bed Mobility Overal bed mobility: Needs Assistance Bed Mobility: Supine to Sit;Sit to Supine     Supine to sit: Supervision;HOB elevated Sit to supine: Supervision;HOB elevated   General bed mobility comments: mild increased effort to perform on own but no physical assist required; SBA for safety  Transfers Overall transfer level: Needs assistance Equipment used: None Transfers: Sit to/from Stand Sit to Stand: Min guard         General transfer comment: fairly strong stand; CGA for safety  Ambulation/Gait Ambulation/Gait assistance: Min guard;Min assist;+2 physical assistance;+2  safety/equipment Gait Distance (Feet): 200 Feet Assistive device: None   Gait velocity: mildly decreased   General Gait Details: pt tends to have L forward lean during ambulation; occasional min assist to steady but overall CGA x2 for safety   Stairs             Wheelchair Mobility    Modified Rankin (Stroke Patients Only)       Balance Overall balance assessment: Needs assistance Sitting-balance support: No upper extremity supported;Feet supported Sitting balance-Leahy Scale: Good Sitting balance - Comments: steady sitting reaching within BOS   Standing balance support: No upper extremity supported Standing balance-Leahy Scale: Good Standing balance comment: steady standing reaching within BOS                            Cognition Arousal/Alertness: Awake/alert Behavior During Therapy: Impulsive Overall Cognitive Status: No family/caregiver present to determine baseline cognitive functioning(Oriented to name)                                        Exercises      General Comments   Nursing cleared pt for participation in physical therapy.      Pertinent Vitals/Pain Pain Assessment: Faces Faces Pain Scale: No hurt Pain Intervention(s): Limited activity within patient's tolerance;Monitored during session;Repositioned  Vitals (HR and O2 on room air) stable and WFL throughout treatment session.    Home Living  Prior Function            PT Goals (current goals can now be found in the care plan section) Acute Rehab PT Goals PT Goal Formulation: Patient unable to participate in goal setting Time For Goal Achievement: 10/03/18 Potential to Achieve Goals: Fair Progress towards PT goals: Progressing toward goals    Frequency    Min 2X/week      PT Plan Discharge plan needs to be updated;Frequency needs to be updated    Co-evaluation              AM-PAC PT "6 Clicks" Daily Activity   Outcome Measure  Difficulty turning over in bed (including adjusting bedclothes, sheets and blankets)?: A Little Difficulty moving from lying on back to sitting on the side of the bed? : A Little Difficulty sitting down on and standing up from a chair with arms (e.g., wheelchair, bedside commode, etc,.)?: Unable Help needed moving to and from a bed to chair (including a wheelchair)?: A Little Help needed walking in hospital room?: A Little Help needed climbing 3-5 steps with a railing? : A Little 6 Click Score: 16    End of Session Equipment Utilized During Treatment: Gait belt Activity Tolerance: Patient tolerated treatment well Patient left: in bed;with call bell/phone within reach;with nursing/sitter in room Nurse Communication: Mobility status;Precautions PT Visit Diagnosis: Other abnormalities of gait and mobility (R26.89);Muscle weakness (generalized) (M62.81);Difficulty in walking, not elsewhere classified (R26.2)     Time: 1610-9604 PT Time Calculation (min) (ACUTE ONLY): 14 min  Charges:  $Therapeutic Activity: 8-22 mins                    Hendricks Limes, PT 09/23/18, 1:52 PM [phone]

## 2018-09-23 NOTE — Progress Notes (Addendum)
  Speech Language Pathology Treatment: Dysphagia  Patient Details Name: Richard Gillespie MRN: [id number] DOB: August 08, 1951 Today's Date: 09/23/2018 Time: 1005-1050 SLP Time Calculation (min) (ACUTE ONLY): 45 min  Assessment / Plan / Recommendation Clinical Impression  Pt seen for ongoing assessment of toleration of diet; management of oral intake of current diet consistency. Pt has been inconsistent in his participation of taking po's w/ staff members while admitted. Pt has a baseline of severe MR and resides at at Group Home. He is Edentulous and requires full assistance w/ ADLs. Noted Caregiver from [facility] was present. She assisted in encouraging pt to take po trials; feeding pt also.  Pt consumed trials of purees and thin liquids w/ no overt s/s of aspiration noted; no decline in respiratory status or few phonations during/post po trials. Pt exhibits Moderate+ oral phase dysphagia c/b poor labial closure on the utensil to strip bolus material from utensil, anterior loss/spillage, and min prolonged bolus formation. Due to pt's Edentulous status, overall oral presentation, and baseline intellectual decline/awareness, full solid foods would not be recommended d/t a choking risk. In addition, pt stopped taking any further po trials (and pulled the bed covers over his head). Pt required full feeding assistance and Mod.+ verbal/tactile cues.  Recommend a Dysphagia level 2 (MINCED foods) w/ thin liquids; aspiration precautions; feeding support and monitoring w/ any oral intake. ST services will f/u for any further education w/ [facility] staff members; MD/NSG updated.     HPI HPI: Pt is a 67 y.o. male who presents to the hospital from his Group Home w/ recurrent nausea/vomiting and noted to have CT abdomen pelvis findings suggestive of high-grade small bowel obstruction. Surgery has followed; NG tube removed now. Patient has severe MR, and is unable to contribute any information to his history. He also had right lower lobe  pneumonia - suspect could be a result of recurrent nausea/vomiting. Pt has baseline moderate malnutrition in context of social or environmental circumstances.      SLP Plan  Continue with current plan of care       Recommendations  Diet recommendations: Dysphagia 2 (fine chop);Thin liquid Liquids provided via: Cup;Straw(maybe could benefit from the Rije dysphagia cup) Medication Administration: Crushed with puree(for safer, easier swallowing) Supervision: Full supervision/cueing for compensatory strategies;Trained caregiver to feed patient Compensations: Minimize environmental distractions;Slow rate;Small sips/bites;Lingual sweep for clearance of pocketing;Multiple dry swallows after each bite/sip;Follow solids with liquid Postural Changes and/or Swallow Maneuvers: Seated upright 90 degrees;Upright 30-60 min after meal                General recommendations: (Dietician f/u) Oral Care Recommendations: Oral care BID;Staff/trained caregiver to provide oral care Follow up Recommendations: (return to Group Home(?)) SLP Visit Diagnosis: Dysphagia, oral phase (R13.11)(could always impact the pharyngeal phase) Plan: Continue with current plan of care       GO                Richard Som, MS, CCC-SLP Richard Gillespie 09/23/2018, 1:18 PM

## 2018-09-23 NOTE — Progress Notes (Signed)
SURGICAL PROGRESS NOTE   Hospital Day(s): 11.   Post op day(s):  Marland Kitchen   Interval History: Patient seen and examined, no acute events or new complaints overnight. Nurse report that he has not taken breakfast. Reports says that he ate some of the supper. No report of nausea or vomiting. No report of bowel movement either.   Vital signs in last 24 hours: [min-max] current  Temp:  [98.1 F (36.7 C)-98.2 F (36.8 C)] 98.2 F (36.8 C) (10/28 2137) Pulse Rate:  [79-90] 79 (10/29 0900) Resp:  [17-18] 17 (10/28 2137) BP: (122-137)/(75-97) 137/97 (10/29 0900) SpO2:  [95 %] 95 % (10/28 2137) Weight:  [65.3 kg] 65.3 kg (10/29 0500)     Height: 5\' 11"  (180.3 cm) Weight: 65.3 kg BMI (Calculated): 20.09   Physical Exam:  Constitutional: alert, cooperative and no distress  Respiratory: breathing non-labored at rest  Cardiovascular: regular rate and sinus rhythm  Gastrointestinal: soft, non-tender, and non-distended  Labs:  CBC Latest Ref Rng & Units 09/23/2018 09/22/2018 09/21/2018  WBC 4.0 - 10.5 K/uL 13.5(H) 11.8(H) 18.6(H)  Hemoglobin 13.0 - 17.0 g/dL 16.1 09.6 04.5  Hematocrit 39.0 - 52.0 % 39.1 38.2(L) 41.0  Platelets 150 - 400 K/uL 179 174 205   CMP Latest Ref Rng & Units 09/23/2018 09/22/2018 09/21/2018  Glucose 70 - 99 mg/dL 409(W) 119(J) 478(G)  BUN 8 - 23 mg/dL 22 20 17   Creatinine 0.61 - 1.24 mg/dL 9.56(O) 1.30(Q) 6.57(Q)  Sodium 135 - 145 mmol/L 137 136 135  Potassium 3.5 - 5.1 mmol/L 3.8 3.9 4.2  Chloride 98 - 111 mmol/L 102 101 100  CO2 22 - 32 mmol/L 27 25 25   Calcium 8.9 - 10.3 mg/dL 8.2(L) 8.4(L) 8.6(L)  Total Protein 6.5 - 8.1 g/dL - 6.8 -  Total Bilirubin 0.3 - 1.2 mg/dL - 0.6 -  Alkaline Phos 38 - 126 U/L - 44 -  AST 15 - 41 U/L - 44(H) -  ALT 0 - 44 U/L - 85(H) -    Imaging studies: No new pertinent imaging studies   Assessment/Plan:  67 y.o.malewith high grade small bowel obstruction (Resolved) and pneumoniawith severe mental retardation. Patient ate a  little bit more yesterday at super. Agree to continue TPN while patient improve oral intake. If he start to improve oral intake, may decrease TPN. Continue medical management as per Hospitalist. Will continue to follow along.   Gae Gallop, MD

## 2018-09-23 NOTE — Consult Note (Signed)
PHARMACY - ADULT TOTAL PARENTERAL NUTRITION CONSULT NOTE   Pharmacy Consult for TPN management Indication: SBO  Patient Measurements: Height: 5\' 11"  (180.3 cm) Weight: 143 lb 15.4 oz (65.3 kg) IBW/kg (Calculated) : 75.3   Body mass index is 20.08 kg/m.  Assessment: 67 YOM without adequate nutrition for > 7 days. Pt at high refeeding risk. Patient's diet has been advanced but he is refusing to eat prior today but now has started to eat at lunch today.  GI: Full liquid diet  Endo:  Insulin requirements in the past 24 hours: 2 units Lytes: K 3.8   Magnesium, potassium and phosphorous are wnl at this time Renal: SCr<1 Pulm: recent asp PNA x 7 days IV abx Cards:  Hepatobil: Neuro: ID: WBC 13.5 10/18: GI panel negative 10/18: Bcx: 1/4 GPR bacillus sp Vancomycin 10/18 x1 Cefepime 10/19 Unasyn 10/19>10/20 Unasyn 10/22>>10/25 Zosyn 10/20>>10/22  TPN Access: 09/19/18 TPN start date: 09/19/18 Nutritional Goals (per RD recommendation on 10/25): KCal: 1894 Protein: 100g Fluid: 1992 ml  Goal TPN rate is 83 ml/hr (provides 90% of nutritional needs) Per Dietician note 10/25- goal rate 83 ml/hr (Dietician called this am Sat 10/26 and confirmed goal rate of 83 ml/h)  Current Nutrition: NPO  Plan:  Per discussion with dietician will maintain TPN at 83 ml/hrClinimix E 5/15 We will evaluate dietary intake and attempt to decrease rate if intake is adequate 20% lipids at 22ml/hr x 12 hours Electrolytes in TPN: none, no replacement needed at this time. Add MVI, trace elements Sensitive ACHS SSI and adjust as needed Monitor TPN labs daily F/U in am  Lowella Bandy, PharmD 09/23/2018,1:59 PM

## 2018-09-23 NOTE — Clinical Social Work Note (Signed)
Patient being weaned off TPN. CSW spoke with Annice Pih, a worker at the group home, and she and a coworker were able to ambulate patient around the nurse's station 3 times. CSW has updated Lupita Leash, the Human resources officer for [employer]. York Spaniel MSW,LCSW [phone]

## 2018-09-23 NOTE — Progress Notes (Signed)
[facility] [place] [facility]      PATIENT NAME: Richard Gillespie    MR#:  [id number]  DATE OF BIRTH:  1951-10-23  SUBJECTIVE:   Caregivers came to the hospital today and ambulated patient around nursing station 3 times.  Patient also ate some breakfast this morning with help from home caregiver.  REVIEW OF SYSTEMS:    Review of Systems  Unable to perform ROS: Mental acuity   DRUG ALLERGIES:   Allergies  Allergen Reactions  . Zithromax [Azithromycin] Other (See Comments)    unknown    VITALS:  Blood pressure 97/63, pulse 80, temperature 98.5 F (36.9 C), temperature source Axillary, resp. rate 18, height 5\' 11"  (1.803 m), weight 65.3 kg, SpO2 96 %.  PHYSICAL EXAMINATION:   Physical Exam  GENERAL:  67 y.o.-year-old patient lying in bed in NAD.  EYES: Pupils equal, round, reactive to light. No scleral icterus. Extraocular muscles intact.  HEENT: Head atraumatic, normocephalic. Oropharynx and nasopharynx clear.  Mildly dry mucous membranes. NECK:  Supple, no jugular venous distention. No thyroid enlargement, no tenderness.  LUNGS: Normal breath sounds bilaterally, no wheezing, rales, rhonchi. No use of accessory muscles of respiration.  CARDIOVASCULAR: RRR, S1, S2 normal. No murmurs, rubs, or gallops.  ABDOMEN: Soft, nontender, Slightly distended. Bowel sounds present. + mid-abdomen scar from previous surgery.  No organomegaly or mass.  EXTREMITIES: No cyanosis, clubbing or edema b/l.  NEUROLOGIC: Cranial nerves II through XII are intact. No focal motor or sensory deficits b/l.  PSYCHIATRIC: The patient is alert and oriented x 1.  SKIN: No obvious rash, lesion, or ulcer.    LABORATORY PANEL:   CBC Recent Labs  Lab 09/23/18 0319  WBC 13.5*  HGB 13.1  HCT 39.1  PLT 179   ------------------------------------------------------------------------------------------------------------------  Chemistries  Recent Labs  Lab 09/22/18 0629  09/23/18 0319  NA 136 137  K 3.9 3.8  CL 101 102  CO2 25 27  GLUCOSE 117* 137*  BUN 20 22  CREATININE 0.57* 0.60*  CALCIUM 8.4* 8.2*  MG 2.0  --   AST 44*  --   ALT 85*  --   ALKPHOS 44  --   BILITOT 0.6  --    ------------------------------------------------------------------------------------------------------------------  Cardiac Enzymes No results for input(s): TROPONINI in the last 168 hours. ------------------------------------------------------------------------------------------------------------------  RADIOLOGY:  No results found.   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  1.  Small bowel obstruction: Resolved, tolerated breakfast this morning. -Surgery following-can be discharged home on soft diet -PO intake has improved, so will discontinue TPN -SLP consult- recommend dysphagia 2 diet  2.  Agitation: Improved. -Monitor  3.  Aspiration pneumonia: Resolved, breathing normally He was treated with Unasyn for 7 days. Patient's blood cultures were consistent with contamination and not likely a true pathogen.    4.  Essential hypertension: Continue clonidine.  5.  History of seizures: No seizure-like activity here. Continue  Keppra, Tegretol, Lamictal  6. Non-severe (moderate) malnutrition in context of social or environmental circumstances -Plan to stop TPN today -Ensure and Magic cup with meals  7.  Hypokalemia: replete PRN.  All the records are reviewed and case discussed with Care Management/Social Worker. Management plans discussed with the patient's brother yesterday   CODE STATUS: Full code  DVT Prophylaxis:  Lovenox  TOTAL TIME TAKING CARE OF THIS PATIENT: 35 minutes.   POSSIBLE D/C TOMORROW, DEPENDING ON CLINICAL  CONDITION.   Jinny Blossom [name] M.D on 09/23/2018 at 3:23 PM  Between 7am to 6pm - Pager - [phone]  After 6pm go to [email or web address] - Social research officer, government  [facility]  Office  [phone]  CC: Primary care physician; Gracelyn Nurse, MD

## 2018-09-24 LAB — BASIC METABOLIC PANEL
Anion gap: 8 (ref 5–15)
BUN: 19 mg/dL (ref 8–23)
CALCIUM: 8.4 mg/dL — AB (ref 8.9–10.3)
CO2: 26 mmol/L (ref 22–32)
Chloride: 99 mmol/L (ref 98–111)
Creatinine, Ser: 0.63 mg/dL (ref 0.61–1.24)
GFR calc Af Amer: >60 mL/min (ref >60)
GLUCOSE: 106 mg/dL — AB (ref 70–99)
POTASSIUM: 4.6 mmol/L (ref 3.5–5.1)
SODIUM: 133 mmol/L — AB (ref 135–145)

## 2018-09-24 LAB — CBC
HCT: 39.2 % (ref 39.0–52.0)
Hemoglobin: 13.3 g/dL (ref 13.0–17.0)
MCH: 29.6 pg (ref 26.0–34.0)
MCHC: 33.9 g/dL (ref 30.0–36.0)
MCV: 87.3 fL (ref 80.0–100.0)
PLATELETS: 188 10*3/uL (ref 150–400)
RBC: 4.49 MIL/uL (ref 4.22–5.81)
RDW: 12.2 % (ref 11.5–15.5)
WBC: 11.6 10*3/uL — ABNORMAL HIGH (ref 4.0–10.5)
nRBC: 0 % (ref 0.0–0.2)

## 2018-09-24 LAB — GLUCOSE, CAPILLARY
GLUCOSE-CAPILLARY: 142 mg/dL — AB (ref 70–99)
Glucose-Capillary: 103 mg/dL — ABNORMAL HIGH (ref 70–99)
Glucose-Capillary: 143 mg/dL — ABNORMAL HIGH (ref 70–99)
Glucose-Capillary: 110 mg/dL — ABNORMAL HIGH (ref 70–99)

## 2018-09-24 MED ORDER — LEVETIRACETAM 500 MG PO TABS: 500.0000 mg | ORAL_TABLET | Freq: Two times a day (BID) | ORAL | 0 refills | Status: DC

## 2018-09-24 NOTE — Progress Notes (Signed)
PICC line remove per MD order.

## 2018-09-24 NOTE — Discharge Summary (Signed)
[facility] - [place] at [facility]   PATIENT NAME: Richard Gillespie    MR#:  [id number]  DATE OF BIRTH:  Jul 21, 1951  DATE OF ADMISSION:  09/12/2018   ADMITTING PHYSICIAN: Oralia Manis, MD  DATE OF DISCHARGE: 09/24/18  PRIMARY CARE PHYSICIAN: Gracelyn Nurse, MD   ADMISSION DIAGNOSIS:  SBO (small bowel obstruction) (HCC) [K56.609] Hx SBO [Z87.19] Pneumonia of right lower lobe due to infectious organism (HCC) [J18.1] Sepsis, due to unspecified organism, unspecified whether acute organ dysfunction present (HCC) [A41.9] DISCHARGE DIAGNOSIS:  Principal Problem:   Sepsis (HCC) Active Problems:   SBO (small bowel obstruction) (HCC)   Aspiration pneumonia (HCC)   Seizure disorder (HCC)   Malnutrition of moderate degree  SECONDARY DIAGNOSIS:   Past Medical History:  Diagnosis Date  . Mental retardation   . Seizures [facility])    HOSPITAL COURSE:   Richard Gillespie is a 67 year old male presented to the ED with nausea and vomiting.  ED evaluation showed high-grade small bowel obstruction in addition to her right lower lobe pneumonia.  He was admitted for further treatment.  1.  Small bowel obstruction: Resolved and now taking good p.o. -Initially with NG tube -Treated with TPN, which was weaned on the day of discharge as patient tolerating food by mouth -Surgery followed -Evaluated by speech therapist, who recommended the following diet: Dysphagia level 2 (MINCED) w/ thin liquids; strict aspiration precautions; Pills in Puree - Crushed as able for safer, easier swallowing. Feeding Assistance.  2.  Aspiration pneumonia: Resolved.  Initial chest x-ray with right lower lobe pneumonia, thought to be secondary to aspiration in the setting of vomiting. -He was treated with Unasyn for 7 days. -Patient's blood cultures were consistent with contamination and not likely a true pathogen.    3.  Essential hypertension: Continued clonidine.  4.  History of seizures: No seizure-like  activity here. Continued Keppra, Tegretol, Lamictal  5. Non-severe (moderate) malnutrition in context of social or environmental circumstances -Ensure and Magic cup with meals  DISCHARGE CONDITIONS:  Small bowel obstruction-resolved Aspiration pneumonia-resolved Mental retardation Seizures Hypertension Moderate malnutrition CONSULTS OBTAINED:  Treatment Team:  Carolan Shiver, MD DRUG ALLERGIES:   Allergies  Allergen Reactions  . Zithromax [Azithromycin] Other (See Comments)    unknown   DISCHARGE MEDICATIONS:   Allergies as of 09/24/2018      Reactions   Zithromax [azithromycin] Other (See Comments)   unknown      Medication List    TAKE these medications   bisacodyl 10 MG suppository Commonly known as:  DULCOLAX Place 10 mg rectally as needed for moderate constipation.   carbamazepine 200 MG 12 hr tablet Commonly known as:  TEGRETOL XR Take 200 mg by mouth 4 (four) times daily. What changed:  Another medication with the same name was removed. Continue taking this medication, and follow the directions you see here.   cloNIDine HCl 0.1 MG Tb12 ER tablet Commonly known as:  KAPVAY Take 0.1 mg by mouth daily.   cloNIDine HCl 0.1 MG Tb12 ER tablet Commonly known as:  KAPVAY Take 0.2 mg by mouth at bedtime.   feeding supplement (ENSURE ENLIVE) Liqd Take 237 mLs by mouth 2 (two) times daily between meals.   lactulose 10 GM/15ML solution Commonly known as:  CHRONULAC Take 10 g by mouth 2 (two) times daily as needed for mild constipation.   lamoTRIgine 150 MG tablet Commonly known as:  LAMICTAL Take 75 mg by mouth 2 (two) times daily.   levETIRAcetam 500 MG  tablet Commonly known as:  KEPPRA Take 1 tablet (500 mg total) by mouth 2 (two) times daily.   loratadine 10 MG tablet Commonly known as:  CLARITIN Take 10 mg by mouth daily.   LORazepam 0.5 MG tablet Commonly known as:  ATIVAN Take 0.5 mg by mouth as needed for anxiety (max 4 tablets  daily).   magnesium citrate Soln Take 1 Bottle by mouth daily as needed for severe constipation (at least 2 days of no BM).   medroxyPROGESTERone 10 MG tablet Commonly known as:  PROVERA Take 10 mg by mouth daily.   mupirocin nasal ointment 2 % Commonly known as:  BACTROBAN Place 1 application into the nose 2 (two) times daily. Use one-half of tube in each nostril twice daily for five (5) days. After application, press sides of nose together and gently massage.   polyethylene glycol packet Commonly known as:  MIRALAX / GLYCOLAX Take 17 g by mouth daily as needed for mild constipation or moderate constipation.   senna 8.6 MG Tabs tablet Commonly known as:  SENOKOT Take 2 tablets by mouth 2 (two) times daily.   THEREMS Tabs Take 1 tablet by mouth daily.   traZODone 150 MG tablet Commonly known as:  DESYREL Take 300 mg by mouth at bedtime.        DISCHARGE INSTRUCTIONS:  1.  Follow-up with PCP in 1 to 2 weeks 2.  Follow-up with a general surgeon in 1 to 2 weeks 3.  Add Ensure and Magic cup 3 times a day with meals DIET:  Dysphagia level 2 (MINCED) w/ thin liquids; strict aspiration precautions; Pills in Puree - Crushed as able for safer, easier swallowing. Feeding Assistance.  Ensure and Magic cup 3 times daily. DISCHARGE CONDITION:  Stable ACTIVITY:  Activity as tolerated OXYGEN:  Home Oxygen: No.  Oxygen Delivery: room air DISCHARGE LOCATION:  group home   If you experience worsening of your admission symptoms, develop shortness of breath, life threatening emergency, suicidal or homicidal thoughts you must seek medical attention immediately by calling 911 or calling your MD immediately  if symptoms less severe.  You Must read complete instructions/literature along with all the possible adverse reactions/side effects for all the Medicines you take and that have been prescribed to you. Take any new Medicines after you have completely understood and accpet all the  possible adverse reactions/side effects.   Please note  You were cared for by a hospitalist during your hospital stay. If you have any questions about your discharge medications or the care you received while you were in the hospital after you are discharged, you can call the unit and asked to speak with the hospitalist on call if the hospitalist that took care of you is not available. Once you are discharged, your primary care physician will handle any further medical issues. Please note that NO REFILLS for any discharge medications will be authorized once you are discharged, as it is imperative that you return to your primary care physician (or establish a relationship with a primary care physician if you do not have one) for your aftercare needs so that they can reassess your need for medications and monitor your lab values.    On the day of Discharge:  VITAL SIGNS:  Blood pressure 109/61, pulse 84, temperature 98.2 F (36.8 C), temperature source Oral, resp. rate 18, height 5\' 11"  (1.803 m), weight 64 kg, SpO2 97 %. PHYSICAL EXAMINATION:  GENERAL:  67 y.o.-year-old patient lying in the bed with no  acute distress.  EYES: Pupils equal, round, reactive to light and accommodation. No scleral icterus. Extraocular muscles intact.  HEENT: Head atraumatic, normocephalic. Oropharynx and nasopharynx clear.  NECK:  Supple, no jugular venous distention. No thyroid enlargement, no tenderness.  LUNGS: Normal breath sounds bilaterally, no wheezing, rales,rhonchi or crepitation. No use of accessory muscles of respiration.  CARDIOVASCULAR: RRR, S1, S2 normal. No murmurs, rubs, or gallops.  ABDOMEN: Soft, non-tender, non-distended. Bowel sounds present. No organomegaly or mass.  EXTREMITIES: No pedal edema, cyanosis, or clubbing.  NEUROLOGIC: Unable to assess due to intellectual disability, but patient ambulating in the hallway. PSYCHIATRIC: The patient is alert. SKIN: No obvious rash, lesion, or ulcer.   DATA REVIEW:   CBC Recent Labs  Lab 09/24/18 0558  WBC 11.6*  HGB 13.3  HCT 39.2  PLT 188    Chemistries  Recent Labs  Lab 09/22/18 0629  09/24/18 0558  NA 136   < > 133*  K 3.9   < > 4.6  CL 101   < > 99  CO2 25   < > 26  GLUCOSE 117*   < > 106*  BUN 20   < > 19  CREATININE 0.57*   < > 0.63  CALCIUM 8.4*   < > 8.4*  MG 2.0  --   --   AST 44*  --   --   ALT 85*  --   --   ALKPHOS 44  --   --   BILITOT 0.6  --   --    < > = values in this interval not displayed.     Microbiology Results  Results for orders placed or performed during the hospital encounter of 09/12/18  Blood Culture (routine x 2)     Status: None   Collection Time: 09/12/18  5:37 PM  Result Value Ref Range Status   Specimen Description BLOOD LEFT WRIST  Final   Special Requests   Final    BOTTLES DRAWN AEROBIC AND ANAEROBIC Blood Culture adequate volume   Culture   Final    NO GROWTH 5 DAYS Performed at [facility], [address], [place], [place] [address]    Report Status 09/17/2018 FINAL  Final  Blood Culture (routine x 2)     Status: Abnormal   Collection Time: 09/12/18  5:42 PM  Result Value Ref Range Status   Specimen Description   Final    BLOOD RIGHT ANTECUBITAL Performed at [facility], [address], [place], [place] [address]    Special Requests   Final    BOTTLES DRAWN AEROBIC AND ANAEROBIC Blood Culture results may not be optimal due to an excessive volume of blood received in culture bottles Performed at [facility], [address] [place], [place] [address]    Culture  Setup Time   Final    Organism ID to follow AEROBIC BOTTLE ONLY CORRECTED RESULTS GRAM POSITIVE RODS PREVIOUSLY REPORTED AS: GRAM NEGATIVE RODS CORRECTED RESULTS CALLED TO: PHARMD M SIMPSON 09/16/18 AT 846 AM BY CM    Culture (A)  Final    BACILLUS SPECIES Standardized susceptibility testing for this organism is not available. Performed at [facility],  [address] [place], [place] [address]    Report Status 09/16/2018 FINAL  Final  Blood Culture ID Panel (Reflexed)     Status: None   Collection Time: 09/12/18  5:42 PM  Result Value Ref Range Status   Enterococcus species NOT DETECTED NOT  DETECTED Final   Listeria monocytogenes NOT DETECTED NOT DETECTED Final   Staphylococcus species NOT DETECTED NOT DETECTED Final   Staphylococcus aureus (BCID) NOT DETECTED NOT DETECTED Final   Streptococcus species NOT DETECTED NOT DETECTED Final   Streptococcus agalactiae NOT DETECTED NOT DETECTED Final   Streptococcus pneumoniae NOT DETECTED NOT DETECTED Final   Streptococcus pyogenes NOT DETECTED NOT DETECTED Final   Acinetobacter baumannii NOT DETECTED NOT DETECTED Final   Enterobacteriaceae species NOT DETECTED NOT DETECTED Final   Enterobacter cloacae complex NOT DETECTED NOT DETECTED Final   Escherichia coli NOT DETECTED NOT DETECTED Final   Klebsiella oxytoca NOT DETECTED NOT DETECTED Final   Klebsiella pneumoniae NOT DETECTED NOT DETECTED Final   Proteus species NOT DETECTED NOT DETECTED Final   Serratia marcescens NOT DETECTED NOT DETECTED Final   Haemophilus influenzae NOT DETECTED NOT DETECTED Final   Neisseria meningitidis NOT DETECTED NOT DETECTED Final   Pseudomonas aeruginosa NOT DETECTED NOT DETECTED Final   Candida albicans NOT DETECTED NOT DETECTED Final   Candida glabrata NOT DETECTED NOT DETECTED Final   Candida krusei NOT DETECTED NOT DETECTED Final   Candida parapsilosis NOT DETECTED NOT DETECTED Final   Candida tropicalis NOT DETECTED NOT DETECTED Final    Comment: Performed at [facility], [address] [place], [place] [address]  MRSA PCR Screening     Status: None   Collection Time: 09/12/18 11:23 PM  Result Value Ref Range Status   MRSA by PCR NEGATIVE NEGATIVE Final    Comment:        The GeneXpert MRSA Assay (FDA approved for NASAL specimens only), is one component of a comprehensive MRSA  colonization surveillance program. It is not intended to diagnose MRSA infection nor to guide or monitor treatment for MRSA infections. Performed at [facility], [address], [place], [place] [address]     RADIOLOGY:  No results found.   Management plans discussed with the patient, family and they are in agreement.  CODE STATUS: DNR   TOTAL TIME TAKING CARE OF THIS PATIENT: 35 minutes.    Jinny Blossom Baneza Bartoszek M.D on 09/24/2018 at 9:39 AM  Between 7am to 6pm - Pager - [phone]  After 6pm go to [email or web address] - Social research officer, government  [facility] [facility]  Office  [phone]  CC: Primary care physician; Gracelyn Nurse, MD   Note: This dictation was prepared with Dragon dictation along with smaller phrase technology. Any transcriptional errors that result from this process are unintentional.

## 2018-09-24 NOTE — NC FL2 (Signed)
[place] MEDICAID FL2 LEVEL OF CARE SCREENING TOOL     IDENTIFICATION  Patient Name: Richard Gillespie Birthdate: November 13, 1951 Sex: male Admission Date (Current Location): 09/12/2018  [facility] and [place] Number:  Chiropodist and Address:  [facility], [address], [place], [place] [address]      Provider Number: [id number]  Attending Physician Name and Address:  Mayo, Allyn Kenner, MD  Relative Name and Phone Number:       Current Level of Care: Hospital Recommended Level of Care: (Group Home) Prior Approval Number:    Date Approved/Denied:   PASRR Number:    Discharge Plan: (Group Home)    Current Diagnoses: Patient Active Problem List   Diagnosis Date Noted  . Malnutrition of moderate degree 09/17/2018  . SBO (small bowel obstruction) (HCC) 09/12/2018  . Aspiration pneumonia (HCC) 09/12/2018  . Seizure disorder (HCC) 09/12/2018  . Sepsis (HCC) 09/12/2018  . E coli infection 01/15/2017  . Agitation 01/15/2017  . Diarrhea 01/15/2017  . Hypokalemia 01/15/2017  . Hyponatremia 01/15/2017  . Leukocytosis 01/15/2017  . Hyperglycemia 01/15/2017  . UTI (urinary tract infection) 01/12/2017    Orientation RESPIRATION BLADDER Height & Weight     Self  Normal Incontinent Weight: 141 lb 1.5 oz (64 kg) Height:  5\' 11"  (180.3 cm)  BEHAVIORAL SYMPTOMS/MOOD NEUROLOGICAL BOWEL NUTRITION STATUS  (none) Convulsions/Seizures Incontinent Diet(dysphagia 3)  AMBULATORY STATUS COMMUNICATION OF NEEDS Skin   Limited Assist Verbally Normal                       Personal Care Assistance Level of Assistance  Bathing, Feeding, Dressing Bathing Assistance: Limited assistance Feeding assistance: Limited assistance Dressing Assistance: Limited assistance     Functional Limitations Info  Speech     Speech Info: Impaired    SPECIAL CARE FACTORS FREQUENCY                       Contractures Contractures Info: Present    Additional  Factors Info  Code Status Code Status Info: dnr              DISCHARGE MEDICATIONS:        Allergies as of 09/24/2018      Reactions   Zithromax [azithromycin] Other (See Comments)   unknown         Medication List    TAKE these medications   bisacodyl 10 MG suppository Commonly known as:  DULCOLAX Place 10 mg rectally as needed for moderate constipation.   carbamazepine 200 MG 12 hr tablet Commonly known as:  TEGRETOL XR Take 200 mg by mouth 4 (four) times daily. What changed:  Another medication with the same name was removed. Continue taking this medication, and follow the directions you see here.   cloNIDine HCl 0.1 MG Tb12 ER tablet Commonly known as:  KAPVAY Take 0.1 mg by mouth daily.   cloNIDine HCl 0.1 MG Tb12 ER tablet Commonly known as:  KAPVAY Take 0.2 mg by mouth at bedtime.   feeding supplement (ENSURE ENLIVE) Liqd Take 237 mLs by mouth 2 (two) times daily between meals.   lactulose 10 GM/15ML solution Commonly known as:  CHRONULAC Take 10 g by mouth 2 (two) times daily as needed for mild constipation.   lamoTRIgine 150 MG tablet Commonly known as:  LAMICTAL Take 75 mg by mouth 2 (two) times daily.   levETIRAcetam 500 MG tablet Commonly known as:  KEPPRA Take 1  tablet (500 mg total) by mouth 2 (two) times daily.   loratadine 10 MG tablet Commonly known as:  CLARITIN Take 10 mg by mouth daily.   LORazepam 0.5 MG tablet Commonly known as:  ATIVAN Take 0.5 mg by mouth as needed for anxiety (max 4 tablets daily).   magnesium citrate Soln Take 1 Bottle by mouth daily as needed for severe constipation (at least 2 days of no BM).   medroxyPROGESTERone 10 MG tablet Commonly known as:  PROVERA Take 10 mg by mouth daily.   mupirocin nasal ointment 2 % Commonly known as:  BACTROBAN Place 1 application into the nose 2 (two) times daily. Use one-half of tube in each nostril twice daily for five (5) days. After application,  press sides of nose together and gently massage.   polyethylene glycol packet Commonly known as:  MIRALAX / GLYCOLAX Take 17 g by mouth daily as needed for mild constipation or moderate constipation.   senna 8.6 MG Tabs tablet Commonly known as:  SENOKOT Take 2 tablets by mouth 2 (two) times daily.   THEREMS Tabs Take 1 tablet by mouth daily.   traZODone 150 MG tablet Commonly known as:  DESYREL Take 300 mg by mouth at bedtime.        Additional Information Follow Up MD Appt: Dr. Letitia Libra: 11/8 at 11:00am; Dr. Hazle Quant: 10/09/18 at 10:00am        Can Return To Day Program: 09/28/18  York Spaniel, LCSW

## 2018-09-24 NOTE — Discharge Instructions (Signed)
Please add Gravy to meats, potatoes; sweet potato Puree w/ butter and sugars at Lunch meals. Yogurt TID meals; likes Ensure and Magic cups. Likes pancakes w/ extra Syrup and soft Eggs. Juices. Pt will be in dysphagia diet. Please chop his food in small pieces.    It was so nice to meet you during your hospitalization!   You were treated for an aspiration pneumonia with a 7-day course of IV antibiotics.  You were also treated for a small bowel obstruction.  If you have additional vomiting at home, please go to your primary care doctor's office or return to the emergency department.  -Dr. Nancy Marus

## 2018-09-24 NOTE — Progress Notes (Signed)
Richard Gillespie  A and O x 1. VSS. Pt tolerating diet well. No complaints of pain or nausea. IV removed intact, prescriptions given. Pt discharged via wheelchair with Richard Gillespie staff.    Allergies as of 09/24/2018      Reactions   Zithromax [azithromycin] Other (See Comments)   unknown      Medication List    TAKE these medications   bisacodyl 10 MG suppository Commonly known as:  DULCOLAX Place 10 mg rectally as needed for moderate constipation.   carbamazepine 200 MG 12 hr tablet Commonly known as:  TEGRETOL XR Take 200 mg by mouth 4 (four) times daily. What changed:  Another medication with the same name was removed. Continue taking this medication, and follow the directions you see here.   cloNIDine HCl 0.1 MG Tb12 ER tablet Commonly known as:  KAPVAY Take 0.1 mg by mouth daily.   cloNIDine HCl 0.1 MG Tb12 ER tablet Commonly known as:  KAPVAY Take 0.2 mg by mouth at bedtime.   feeding supplement (ENSURE ENLIVE) Liqd Take 237 mLs by mouth 2 (two) times daily between meals.   lactulose 10 GM/15ML solution Commonly known as:  CHRONULAC Take 10 g by mouth 2 (two) times daily as needed for mild constipation.   lamoTRIgine 150 MG tablet Commonly known as:  LAMICTAL Take 75 mg by mouth 2 (two) times daily.   levETIRAcetam 500 MG tablet Commonly known as:  KEPPRA Take 1 tablet (500 mg total) by mouth 2 (two) times daily.   loratadine 10 MG tablet Commonly known as:  CLARITIN Take 10 mg by mouth daily.   LORazepam 0.5 MG tablet Commonly known as:  ATIVAN Take 0.5 mg by mouth as needed for anxiety (max 4 tablets daily).   magnesium citrate Soln Take 1 Bottle by mouth daily as needed for severe constipation (at least 2 days of no BM).   medroxyPROGESTERone 10 MG tablet Commonly known as:  PROVERA Take 10 mg by mouth daily.   mupirocin nasal ointment 2 % Commonly known as:  BACTROBAN Place 1 application into the nose 2 (two) times daily. Use one-half of tube in  each nostril twice daily for five (5) days. After application, press sides of nose together and gently massage.   polyethylene glycol packet Commonly known as:  MIRALAX / GLYCOLAX Take 17 g by mouth daily as needed for mild constipation or moderate constipation.   senna 8.6 MG Tabs tablet Commonly known as:  SENOKOT Take 2 tablets by mouth 2 (two) times daily.   THEREMS Tabs Take 1 tablet by mouth daily.   traZODone 150 MG tablet Commonly known as:  DESYREL Take 300 mg by mouth at bedtime.       Vitals:   09/24/18 0526 09/24/18 0908  BP: 113/78 109/61  Pulse: 88 84  Resp: 18   Temp: 98.2 F (36.8 C)   SpO2: 97% 97%    Richard Gillespie

## 2018-09-24 NOTE — Clinical Social Work Note (Signed)
Patient to discharge today to return to [facility] group home. Lupita Leash at [facility] is aware and they will provide transport. CSW has faxed discharge information for review. CSW has contacted patient's brother: Hiro Vipond: [phone] and he is aware and in agreement with discharge. York Spaniel MSW,LCSW [phone]

## 2019-08-03 ENCOUNTER — Other Ambulatory Visit: Payer: Self-pay

## 2019-08-03 ENCOUNTER — Emergency Department: Payer: Medicare Other

## 2019-08-03 ENCOUNTER — Inpatient Hospital Stay
Admission: EM | Admit: 2019-08-03 | Discharge: 2019-08-17 | DRG: 329 | Disposition: A | Discharge status: Home / Self Care | Payer: Medicare Other | Attending: General Surgery | Admitting: General Surgery

## 2019-08-03 ENCOUNTER — Inpatient Hospital Stay: Payer: Medicare Other

## 2019-08-03 ENCOUNTER — Encounter: Payer: Self-pay | Admitting: Emergency Medicine

## 2019-08-03 DIAGNOSIS — Z0189 Encounter for other specified special examinations: Secondary | ICD-10-CM

## 2019-08-03 DIAGNOSIS — X58XXXA Exposure to other specified factors, initial encounter: Secondary | ICD-10-CM | POA: Diagnosis present

## 2019-08-03 DIAGNOSIS — S36438A Laceration of other part of small intestine, initial encounter: Secondary | ICD-10-CM | POA: Diagnosis not present

## 2019-08-03 DIAGNOSIS — J690 Pneumonitis due to inhalation of food and vomit: Secondary | ICD-10-CM | POA: Diagnosis present

## 2019-08-03 DIAGNOSIS — K5651 Intestinal adhesions [bands], with partial obstruction: Principal | ICD-10-CM | POA: Diagnosis present

## 2019-08-03 DIAGNOSIS — Z8669 Personal history of other diseases of the nervous system and sense organs: Secondary | ICD-10-CM | POA: Diagnosis not present

## 2019-08-03 DIAGNOSIS — Z4659 Encounter for fitting and adjustment of other gastrointestinal appliance and device: Secondary | ICD-10-CM

## 2019-08-03 DIAGNOSIS — Z978 Presence of other specified devices: Secondary | ICD-10-CM

## 2019-08-03 DIAGNOSIS — E876 Hypokalemia: Secondary | ICD-10-CM | POA: Diagnosis present

## 2019-08-03 DIAGNOSIS — G40909 Epilepsy, unspecified, not intractable, without status epilepticus: Secondary | ICD-10-CM | POA: Diagnosis present

## 2019-08-03 DIAGNOSIS — R112 Nausea with vomiting, unspecified: Secondary | ICD-10-CM | POA: Diagnosis present

## 2019-08-03 DIAGNOSIS — Y92234 Operating room of hospital as the place of occurrence of the external cause: Secondary | ICD-10-CM | POA: Diagnosis not present

## 2019-08-03 DIAGNOSIS — Z881 Allergy status to other antibiotic agents status: Secondary | ICD-10-CM | POA: Diagnosis not present

## 2019-08-03 DIAGNOSIS — K9181 Other intraoperative complications of digestive system: Secondary | ICD-10-CM | POA: Diagnosis not present

## 2019-08-03 DIAGNOSIS — E440 Moderate protein-calorie malnutrition: Secondary | ICD-10-CM | POA: Diagnosis present

## 2019-08-03 DIAGNOSIS — Z20828 Contact with and (suspected) exposure to other viral communicable diseases: Secondary | ICD-10-CM | POA: Diagnosis present

## 2019-08-03 DIAGNOSIS — K567 Ileus, unspecified: Secondary | ICD-10-CM | POA: Diagnosis not present

## 2019-08-03 DIAGNOSIS — Z960 Presence of urogenital implants: Secondary | ICD-10-CM | POA: Diagnosis not present

## 2019-08-03 DIAGNOSIS — F72 Severe intellectual disabilities: Secondary | ICD-10-CM | POA: Diagnosis present

## 2019-08-03 DIAGNOSIS — Z8701 Personal history of pneumonia (recurrent): Secondary | ICD-10-CM

## 2019-08-03 DIAGNOSIS — Z79899 Other long term (current) drug therapy: Secondary | ICD-10-CM

## 2019-08-03 DIAGNOSIS — R05 Cough: Secondary | ICD-10-CM

## 2019-08-03 DIAGNOSIS — B370 Candidal stomatitis: Secondary | ICD-10-CM | POA: Diagnosis not present

## 2019-08-03 DIAGNOSIS — K56609 Unspecified intestinal obstruction, unspecified as to partial versus complete obstruction: Secondary | ICD-10-CM | POA: Diagnosis present

## 2019-08-03 DIAGNOSIS — Z8619 Personal history of other infectious and parasitic diseases: Secondary | ICD-10-CM | POA: Diagnosis not present

## 2019-08-03 DIAGNOSIS — S46111A Strain of muscle, fascia and tendon of long head of biceps, right arm, initial encounter: Secondary | ICD-10-CM | POA: Diagnosis present

## 2019-08-03 DIAGNOSIS — Z8719 Personal history of other diseases of the digestive system: Secondary | ICD-10-CM | POA: Diagnosis not present

## 2019-08-03 DIAGNOSIS — A419 Sepsis, unspecified organism: Secondary | ICD-10-CM

## 2019-08-03 DIAGNOSIS — R059 Cough, unspecified: Secondary | ICD-10-CM

## 2019-08-03 DIAGNOSIS — Z79890 Hormone replacement therapy (postmenopausal): Secondary | ICD-10-CM | POA: Diagnosis not present

## 2019-08-03 DIAGNOSIS — I10 Essential (primary) hypertension: Secondary | ICD-10-CM | POA: Diagnosis present

## 2019-08-03 DIAGNOSIS — Y838 Other surgical procedures as the cause of abnormal reaction of the patient, or of later complication, without mention of misadventure at the time of the procedure: Secondary | ICD-10-CM | POA: Diagnosis not present

## 2019-08-03 DIAGNOSIS — R197 Diarrhea, unspecified: Secondary | ICD-10-CM | POA: Diagnosis not present

## 2019-08-03 DIAGNOSIS — E870 Hyperosmolality and hypernatremia: Secondary | ICD-10-CM | POA: Diagnosis not present

## 2019-08-03 DIAGNOSIS — K9189 Other postprocedural complications and disorders of digestive system: Secondary | ICD-10-CM

## 2019-08-03 DIAGNOSIS — K565 Intestinal adhesions [bands], unspecified as to partial versus complete obstruction: Secondary | ICD-10-CM | POA: Diagnosis not present

## 2019-08-03 DIAGNOSIS — Z66 Do not resuscitate: Secondary | ICD-10-CM | POA: Diagnosis present

## 2019-08-03 DIAGNOSIS — F79 Unspecified intellectual disabilities: Secondary | ICD-10-CM | POA: Diagnosis not present

## 2019-08-03 DIAGNOSIS — S46211A Strain of muscle, fascia and tendon of other parts of biceps, right arm, initial encounter: Secondary | ICD-10-CM | POA: Diagnosis not present

## 2019-08-03 DIAGNOSIS — M7989 Other specified soft tissue disorders: Secondary | ICD-10-CM

## 2019-08-03 DIAGNOSIS — S40021A Contusion of right upper arm, initial encounter: Secondary | ICD-10-CM | POA: Diagnosis not present

## 2019-08-03 LAB — CBC WITH DIFFERENTIAL/PLATELET
Abs Immature Granulocytes: 0.05 10*3/uL (ref 0.00–0.07)
Basophils Absolute: 0 10*3/uL (ref 0.0–0.1)
Basophils Relative: 0 %
Eosinophils Absolute: 0 10*3/uL (ref 0.0–0.5)
Eosinophils Relative: 0 %
HCT: 48.1 % (ref 39.0–52.0)
Hemoglobin: 16 g/dL (ref 13.0–17.0)
Immature Granulocytes: 0 %
Lymphocytes Relative: 5 %
Lymphs Abs: 0.6 10*3/uL — ABNORMAL LOW (ref 0.7–4.0)
MCH: 29.4 pg (ref 26.0–34.0)
MCHC: 33.3 g/dL (ref 30.0–36.0)
MCV: 88.4 fL (ref 80.0–100.0)
Monocytes Absolute: 1.7 10*3/uL — ABNORMAL HIGH (ref 0.1–1.0)
Monocytes Relative: 12 %
Neutro Abs: 11.9 10*3/uL — ABNORMAL HIGH (ref 1.7–7.7)
Neutrophils Relative %: 83 %
Platelets: 257 10*3/uL (ref 150–400)
RBC: 5.44 MIL/uL (ref 4.22–5.81)
RDW: 12.6 % (ref 11.5–15.5)
WBC: 14.4 10*3/uL — ABNORMAL HIGH (ref 4.0–10.5)
nRBC: 0 % (ref 0.0–0.2)

## 2019-08-03 LAB — COMPREHENSIVE METABOLIC PANEL
ALT: 55 U/L — ABNORMAL HIGH (ref 0–44)
AST: 98 U/L — ABNORMAL HIGH (ref 15–41)
Albumin: 5.4 g/dL — ABNORMAL HIGH (ref 3.5–5.0)
Alkaline Phosphatase: 64 U/L (ref 38–126)
Anion gap: 15 (ref 5–15)
BUN: 41 mg/dL — ABNORMAL HIGH (ref 8–23)
CO2: 24 mmol/L (ref 22–32)
Calcium: 9.8 mg/dL (ref 8.9–10.3)
Chloride: 106 mmol/L (ref 98–111)
Creatinine, Ser: 1.12 mg/dL (ref 0.61–1.24)
GFR calc Af Amer: >60 mL/min (ref >60)
GFR calc non Af Amer: >60 mL/min (ref >60)
Glucose, Bld: 176 mg/dL — ABNORMAL HIGH (ref 70–99)
Potassium: 3.4 mmol/L — ABNORMAL LOW (ref 3.5–5.1)
Sodium: 145 mmol/L (ref 135–145)
Total Bilirubin: 0.8 mg/dL (ref 0.3–1.2)
Total Protein: 9.1 g/dL — ABNORMAL HIGH (ref 6.5–8.1)

## 2019-08-03 LAB — SARS CORONAVIRUS 2 BY RT PCR (HOSPITAL ORDER, PERFORMED IN ~~LOC~~ HOSPITAL LAB): SARS Coronavirus 2: NEGATIVE

## 2019-08-03 LAB — MRSA PCR SCREENING: MRSA by PCR: NEGATIVE

## 2019-08-03 LAB — LACTIC ACID, PLASMA
Lactic Acid, Venous: 1.5 mmol/L (ref 0.5–1.9)
Lactic Acid, Venous: 2.8 mmol/L (ref 0.5–1.9)

## 2019-08-03 LAB — PROTIME-INR
INR: 1.1 (ref 0.8–1.2)
Prothrombin Time: 13.7 seconds (ref 11.4–15.2)

## 2019-08-03 MED ORDER — TRAZODONE HCL 100 MG PO TABS
300.0000 mg | ORAL_TABLET | Freq: Every day | ORAL | Status: DC
  Administered 2019-08-04 – 2019-08-16 (×12): 300 mg via ORAL
  Filled 2019-08-03 (×12): qty 3

## 2019-08-03 MED ORDER — SENNA 8.6 MG PO TABS
2.0000 | ORAL_TABLET | Freq: Two times a day (BID) | ORAL | Status: DC
  Administered 2019-08-04 – 2019-08-09 (×10): 17.2 mg via ORAL
  Filled 2019-08-03 (×10): qty 2

## 2019-08-03 MED ORDER — VANCOMYCIN HCL 1.5 G IV SOLR
1500.0000 mg | Freq: Once | INTRAVENOUS | Status: AC
  Administered 2019-08-03: 1500 mg via INTRAVENOUS
  Filled 2019-08-03: qty 1500

## 2019-08-03 MED ORDER — CLONIDINE HCL 0.1 MG/24HR TD PTWK
0.1000 mg | MEDICATED_PATCH | TRANSDERMAL | Status: DC
  Administered 2019-08-03 – 2019-08-17 (×3): 0.1 mg via TRANSDERMAL
  Filled 2019-08-03 (×3): qty 1

## 2019-08-03 MED ORDER — PIPERACILLIN-TAZOBACTAM 3.375 G IVPB
3.3750 g | Freq: Once | INTRAVENOUS | Status: AC
  Administered 2019-08-03: 3.375 g via INTRAVENOUS
  Filled 2019-08-03: qty 50

## 2019-08-03 MED ORDER — MUPIROCIN CALCIUM 2 % NA OINT: 1.0000 "application " | TOPICAL_OINTMENT | Freq: Two times a day (BID) | NASAL | Status: DC

## 2019-08-03 MED ORDER — ENSURE ENLIVE PO LIQD
237.0000 mL | Freq: Two times a day (BID) | ORAL | Status: DC
  Administered 2019-08-10 – 2019-08-16 (×10): 237 mL via ORAL

## 2019-08-03 MED ORDER — IOHEXOL 300 MG/ML  SOLN
100.0000 mL | Freq: Once | INTRAMUSCULAR | Status: AC | PRN
  Administered 2019-08-03: 11:00:00 100 mL via INTRAVENOUS

## 2019-08-03 MED ORDER — LORAZEPAM 2 MG/ML IJ SOLN
1.0000 mg | Freq: Once | INTRAMUSCULAR | Status: AC
  Administered 2019-08-03: 14:00:00 1 mg via INTRAVENOUS
  Filled 2019-08-03: qty 1

## 2019-08-03 MED ORDER — MAGNESIUM CITRATE PO SOLN
1.0000 | Freq: Every day | ORAL | Status: DC | PRN
  Filled 2019-08-03: qty 296

## 2019-08-03 MED ORDER — POLYETHYLENE GLYCOL 3350 17 G PO PACK: 17.0000 g | PACK | Freq: Every day | ORAL | Status: DC | PRN

## 2019-08-03 MED ORDER — LORATADINE 10 MG PO TABS
10.0000 mg | ORAL_TABLET | Freq: Every day | ORAL | Status: DC
  Administered 2019-08-04 – 2019-08-17 (×13): 10 mg via ORAL
  Filled 2019-08-03 (×13): qty 1

## 2019-08-03 MED ORDER — MEDROXYPROGESTERONE ACETATE 10 MG PO TABS
10.0000 mg | ORAL_TABLET | Freq: Every day | ORAL | Status: DC
  Administered 2019-08-04 – 2019-08-17 (×14): 10 mg via ORAL
  Filled 2019-08-03 (×15): qty 1

## 2019-08-03 MED ORDER — SODIUM CHLORIDE 0.9 % IV SOLN
INTRAVENOUS | Status: DC
  Administered 2019-08-03 – 2019-08-04 (×2): via INTRAVENOUS

## 2019-08-03 MED ORDER — LACTULOSE 10 GM/15ML PO SOLN: 10.0000 g | Freq: Two times a day (BID) | ORAL | Status: DC | PRN

## 2019-08-03 MED ORDER — CARBAMAZEPINE ER 200 MG PO TB12
200.0000 mg | ORAL_TABLET | Freq: Four times a day (QID) | ORAL | Status: DC
  Administered 2019-08-03 – 2019-08-04 (×4): 200 mg via ORAL
  Filled 2019-08-03 (×8): qty 1

## 2019-08-03 MED ORDER — LORAZEPAM 0.5 MG PO TABS: 0.5000 mg | ORAL_TABLET | ORAL | Status: DC | PRN

## 2019-08-03 MED ORDER — VANCOMYCIN HCL 10 G IV SOLR
1250.0000 mg | INTRAVENOUS | Status: DC
  Filled 2019-08-03: qty 1250

## 2019-08-03 MED ORDER — SODIUM CHLORIDE 0.9 % IV BOLUS
1000.0000 mL | Freq: Once | INTRAVENOUS | Status: AC
  Administered 2019-08-03: 1000 mL via INTRAVENOUS

## 2019-08-03 MED ORDER — ONDANSETRON HCL 4 MG/2ML IJ SOLN
4.0000 mg | Freq: Once | INTRAMUSCULAR | Status: AC
  Administered 2019-08-03: 10:00:00 4 mg via INTRAVENOUS
  Filled 2019-08-03: qty 2

## 2019-08-03 MED ORDER — LEVETIRACETAM 500 MG PO TABS: 500.0000 mg | ORAL_TABLET | Freq: Two times a day (BID) | ORAL | Status: DC

## 2019-08-03 MED ORDER — BISACODYL 10 MG RE SUPP: 10.0000 mg | RECTAL | Status: DC | PRN

## 2019-08-03 MED ORDER — ADULT MULTIVITAMIN W/MINERALS CH
1.0000 | ORAL_TABLET | Freq: Every day | ORAL | Status: DC
  Administered 2019-08-04 – 2019-08-17 (×13): 1 via ORAL
  Filled 2019-08-03 (×13): qty 1

## 2019-08-03 MED ORDER — PIPERACILLIN-TAZOBACTAM 3.375 G IVPB
3.3750 g | Freq: Three times a day (TID) | INTRAVENOUS | Status: DC
  Administered 2019-08-03 – 2019-08-12 (×24): 3.375 g via INTRAVENOUS
  Filled 2019-08-03 (×23): qty 50

## 2019-08-03 MED ORDER — LAMOTRIGINE 25 MG PO TABS
75.0000 mg | ORAL_TABLET | Freq: Two times a day (BID) | ORAL | Status: DC
  Administered 2019-08-03 – 2019-08-17 (×27): 75 mg via ORAL
  Filled 2019-08-03 (×28): qty 3

## 2019-08-03 MED ORDER — LEVETIRACETAM IN NACL 500 MG/100ML IV SOLN
500.0000 mg | Freq: Two times a day (BID) | INTRAVENOUS | Status: DC
  Administered 2019-08-03 – 2019-08-13 (×19): 500 mg via INTRAVENOUS
  Filled 2019-08-03 (×21): qty 100

## 2019-08-03 NOTE — ED Notes (Signed)
Attempt for 2nd set of blood cultures not successful, MD aware, lab called to try. Starting antibiotics per EDP.

## 2019-08-03 NOTE — Consult Note (Signed)
Pharmacy Antibiotic Note  Richard Gillespie is a 68 y.o. male admitted on 08/03/2019 with sepsis.  Pharmacy has been consulted for Vancomycin and Zosyn dosing.  Plan: 1) Patient did not receive loading dose of Vancomycin in the ED, so will give patient now. Vancomycin 1500mg  IV x 1 as the loading dose, followed by Vancomycin 1250 mg IV Q 24 hrs to start 9/8@1400 .  Goal AUC 400-550. Expected AUC: 457.8 Expected Css: 9.9 SCr used: 1.12  2) Zosyn 3.375g Q8 hours  Height: 5\' 8"  (172.7 cm) Weight: 150 lb (68 kg) IBW/kg (Calculated) : 68.4  Temp (24hrs), Avg:98 F (36.7 C), Min:97.5 F (36.4 C), Max:98.5 F (36.9 C)  Recent Labs  Lab 08/03/19 0930  WBC 14.4*  CREATININE 1.12  LATICACIDVEN 1.5    Estimated Creatinine Clearance: 61.6 mL/min (by C-G formula based on SCr of 1.12 mg/dL).    Allergies  Allergen Reactions  . Zithromax [Azithromycin] Other (See Comments)    unknown    Antimicrobials this admission: VAncomycin 9/7 >>  Zosyn 9/7 >>  Rocephin 9/7 x 1  Microbiology results: 9/7 BCx: pending   Thank you for allowing pharmacy to be a part of this patient's care.  Ezequias Lard A Tameron Lama 08/03/2019 1:33 PM

## 2019-08-03 NOTE — ED Triage Notes (Signed)
Per staff at group home, patient has been refusing to eat and drink since Saturday.  Staff member states patient has had 2 ensures since Saturday. Patient has vomited x 3 in the past 24 hours.  Staff reports patient had 1 episode of diarrhea this am, but generally has loose bowels due to history of intestinal problems.

## 2019-08-03 NOTE — Progress Notes (Signed)
PHARMACY -  BRIEF ANTIBIOTIC NOTE   Pharmacy has received consult(s) for Zosyn and Vancomycin from an ED provider.  The patient's profile has been reviewed for ht/wt/allergies/indication/available labs.    One time order(s) placed for Zosyn 3.375g IV and Vancomycin 1500mg  IV.  Further antibiotics/pharmacy consults should be ordered by admitting physician if indicated.                       Thank you, Pearla Dubonnet 08/03/2019  10:13 AM

## 2019-08-03 NOTE — ED Notes (Signed)
Pt noted to have vomited on self and bed. This tech cleaned pt, changed gown and bed linen. Gave pt a warm blanket. Caregiver at bedside.

## 2019-08-03 NOTE — ED Notes (Signed)
Lab called for phlebotomy draw for second set of blood cultures.

## 2019-08-03 NOTE — ED Notes (Signed)
Pt from [facility] in [place]

## 2019-08-03 NOTE — Progress Notes (Signed)
NP notified: Lactic acid is 2.8

## 2019-08-03 NOTE — H&P (Addendum)
[facility] NAME: Richard Gillespie    MR#:  [id number]  DATE OF BIRTH:  11/15/1951  DATE OF ADMISSION:  08/03/2019  PRIMARY CARE PHYSICIAN: Baxter Hire, MD   REQUESTING/REFERRING PHYSICIAN: Delman Kitten, MD  CHIEF COMPLAINT:   Chief Complaint  Patient presents with  . Emesis    HISTORY OF PRESENT ILLNESS:   68 year old male with past medical history of seizures, mental retardation, recurrent pneumonia, and small bowel obstruction presenting to the ED from group home with complaints of poor p.o. intake, nausea and vomiting, and loose stools.  Patient with history of mental retardation therefore history obtained mostly from patient caregiver who is currently at the bedside.  Per patient's caregiver, patient's last meal was present on Saturday since then he has refused to eat or drink.  Staff has tried to give him Ensure however he continued to vomit he has had 3 episode of nausea and vomiting in the past 24 hours.  No reports of fevers or chills, patient not complaining of abdominal pain, chest pain, shortness of breath, no recent COVID out break in the group home.  On arrival to the ED, he was afebrile with blood pressure 155/88 mm Hg and pulse rate 122 beats/min. There were no focal neurological deficits; he was alert but was noted to be vomiting.  He had an episode of urinary incontinence which caregiver is unusual for him.  Tinge of blood was also noted in the patient's underwear concerning for hematuria?.  Initial labs revealed potassium 3.4, lactic acid 1.5, WBC 14.4, COVID 19 negative.  Pulmonary vascular congestion and small bowel dilation concerning for SBO.  Follow-up CT of the abdomen showed high-grade small bowel obstruction with transition point in the central abdomen running for ablation as a source for obstruction.  No evidence for perforation or abscess.  Also noted atelectasis or early infiltrate in the right lower lobe.   Given this finding hospitalist contacted to admit for further management.  PAST MEDICAL HISTORY:   Past Medical History:  Diagnosis Date  . Mental retardation   . Seizures ([place])     PAST SURGICAL HISTORY:   Past Surgical History:  Procedure Laterality Date  . COLON SURGERY      SOCIAL HISTORY:   Social History   Tobacco Use  . Smoking status: Never Smoker  . Smokeless tobacco: Never Used  Substance Use Topics  . Alcohol use: No    FAMILY HISTORY:  History reviewed. No pertinent family history.  DRUG ALLERGIES:   Allergies  Allergen Reactions  . Zithromax [Azithromycin] Other (See Comments)    unknown    REVIEW OF SYSTEMS:   ROS Unable to obtain from patient due to mental retardation.  MEDICATIONS AT HOME:   Prior to Admission medications   Medication Sig Start Date End Date Taking? Authorizing Provider  carbamazepine (TEGRETOL XR) 200 MG 12 hr tablet Take 200 mg by mouth 4 (four) times daily.    Yes [provider]  cloNIDine (CATAPRES - DOSED IN MG/24 HR) 0.1 mg/24hr patch Place 0.1 mg onto the skin once a week. 01/30/19 01/30/20 Yes [provider]  lamoTRIgine (LAMICTAL) 150 MG tablet Take 75 mg by mouth 2 (two) times daily.    Yes [provider]  levETIRAcetam (KEPPRA) 500 MG tablet Take 1 tablet (500 mg total) by mouth 2 (two) times daily. 09/24/18  Yes Mayo, Pete Pelt, MD  loratadine (CLARITIN) 10 MG tablet Take 10 mg  by mouth daily.   Yes [provider]  LORazepam (ATIVAN) 0.5 MG tablet Take 0.5 mg by mouth as needed for anxiety (max 4 tablets daily).    Yes [provider]  medroxyPROGESTERone (PROVERA) 10 MG tablet Take 10 mg by mouth daily.   Yes [provider]  Multiple Vitamin (THEREMS) TABS Take 1 tablet by mouth daily.   Yes [provider]  traZODone (DESYREL) 150 MG tablet Take 300 mg by mouth at bedtime.   Yes [provider]  bisacodyl (DULCOLAX) 10 MG suppository Place 10  mg rectally as needed for moderate constipation.    [provider]  feeding supplement, ENSURE ENLIVE, (ENSURE ENLIVE) LIQD Take 237 mLs by mouth 2 (two) times daily between meals. Patient not taking: Reported on 09/12/2018 01/16/17   Katharina Caper, MD  lactulose (CHRONULAC) 10 GM/15ML solution Take 10 g by mouth 2 (two) times daily as needed for mild constipation.     [provider]  magnesium citrate SOLN Take 1 Bottle by mouth daily as needed for severe constipation (at least 2 days of no BM).     [provider]  mupirocin nasal ointment (BACTROBAN) 2 % Place 1 application into the nose 2 (two) times daily. Use one-half of tube in each nostril twice daily for five (5) days. After application, press sides of nose together and gently massage.    [provider]  polyethylene glycol (MIRALAX / GLYCOLAX) packet Take 17 g by mouth daily as needed for mild constipation or moderate constipation.     [provider]  senna (SENOKOT) 8.6 MG TABS tablet Take 2 tablets by mouth 2 (two) times daily.     [provider]      VITAL SIGNS:  Blood pressure 104/72, pulse (!) 103, temperature (!) 97.5 F (36.4 C), temperature source Oral, resp. rate 17, height 5\' 8"  (1.727 m), weight 68 kg, SpO2 91 %.  PHYSICAL EXAMINATION:   Physical Exam  GENERAL:  68 y.o.-year-old patient lying in the bed with no acute distress.  EYES: Pupils equal, round, reactive to light. No scleral icterus. Extraocular muscles intact.  HEENT: Head atraumatic, normocephalic. Oropharynx and nasopharynx clear.  Mildly dry mucous membranes. NECK:  Supple, no jugular venous distention. No thyroid enlargement, no tenderness.  LUNGS: Normal breath sounds bilaterally, no wheezing, rales, rhonchi. No use of accessory muscles of respiration.  CARDIOVASCULAR: RRR, S1, S2 normal. No murmurs, rubs, or gallops.  ABDOMEN: Soft, nontender, Slightly distended. Bowel sounds present. +  mid-abdomen scar from previous surgery.  No organomegaly or mass.  EXTREMITIES: No cyanosis, clubbing or edema b/l.  NEUROLOGIC: Cranial nerves II through XII are intact. No focal motor or sensory deficits b/l.  PSYCHIATRIC: The patient is alert and oriented x 1.  SKIN: No obvious rash, lesion, or ulcer.  DATA REVIEWED:  LABORATORY PANEL:   CBC Recent Labs  Lab 08/03/19 0930  WBC 14.4*  HGB 16.0  HCT 48.1  PLT 257   ------------------------------------------------------------------------------------------------------------------  Chemistries  Recent Labs  Lab 08/03/19 0930  NA 145  K 3.4*  CL 106  CO2 24  GLUCOSE 176*  BUN 41*  CREATININE 1.12  CALCIUM 9.8  AST 98*  ALT 55*  ALKPHOS 64  BILITOT 0.8   ------------------------------------------------------------------------------------------------------------------  Cardiac Enzymes No results for input(s): TROPONINI in the last 168 hours. ------------------------------------------------------------------------------------------------------------------  RADIOLOGY:  Ct Abdomen Pelvis W Contrast  Result Date: 08/03/2019 CLINICAL DATA:  Per staff at group home, patient has been refusing  to eat and drink since Saturday. Staff member states patient has had 2 ensures since Saturday. Patient has vomited x 3 in the past 24 hours. Staff reports patient had 1 episode.*comment was truncated*^12000mL OMNIPAQUE IOHEXOL 300 MG/ML SOLN EXAM: CT ABDOMEN AND PELVIS WITH CONTRAST TECHNIQUE: Multidetector CT imaging of the abdomen and pelvis was performed using the standard protocol following bolus administration of intravenous contrast. CONTRAST:  100mL OMNIPAQUE IOHEXOL 300 MG/ML  SOLN COMPARISON:  CT of the abdomen and pelvis 09/12/2018 FINDINGS: Lower chest: There is atelectasis or early infiltrate within the RIGHT LOWER lobe. The heart is within the RIGHT side of the chest, warrant angled axis. Hepatobiliary: Liver is unremarkable. Normal  appearance of the gallbladder. Pancreas: Unremarkable. No pancreatic ductal dilatation or surrounding inflammatory changes. Spleen: Normal in size without focal abnormality. Adrenals/Urinary Tract: Adrenal glands are normal in appearance. Kidneys are symmetric in size. There are small bilateral low-attenuation lesions within the kidneys, too small fully characterize but stable. The bladder and visualized portion of the urethra are normal. Stomach/Bowel: The stomach is distended and contains an air-fluid level in accounts for the air identified within the RIGHT UPPER QUADRANT on plain film. There is no evidence for perforation. There is marked dilatation of small bowel loops which measure up to 6 centimeters. There is an abrupt transition point from dilated to normal caliber small bowel in the central abdomen rib there is also a twist of mesenteric vessels, best identified on sagittal image number 50/7. The distal small bowel loops and colon are unremarkable. Vascular/Lymphatic: No significant vascular findings are present. No enlarged abdominal or pelvic lymph nodes. Reproductive: Prostate is unremarkable. Other: There is a small amount of fluid. There is no free intraperitoneal air. No abscess. Musculoskeletal: No acute or significant osseous findings. IMPRESSION: 1. High-grade small bowel obstruction with transition point in the central abdomen. Favor adhesions as a source for obstruction. 2. No evidence for perforation.  No abscess. 3. Small amount of free intraperitoneal fluid. 4. Right lower lobe atelectasis or early infiltrate. Dextrocardia, stable in appearance. Electronically Signed   By: Norva PavlovElizabeth  Brown M.D.   On: 08/03/2019 11:00   Dg Chest Port 1 View  Result Date: 08/03/2019 CLINICAL DATA:  Suspected sepsis.  Vomiting. EXAM: PORTABLE CHEST 1 VIEW COMPARISON:  09/12/2018 FINDINGS: There is a scoliosis deformity. Diffuse pulmonary vascular congestion identified. Asymmetric elevation of left  hemidiaphragm, unchanged. There is marked distension of the small bowel loops within the left upper quadrant of the abdomen concerning for small bowel obstruction. IMPRESSION: 1. Pulmonary vascular congestion. 2. Small bowel dilatation concerning for SBO. Electronically Signed   By: Signa Kellaylor  Stroud M.D.   On: 08/03/2019 10:26   Dg Abd 2 Views  Result Date: 08/03/2019 CLINICAL DATA:  Possible small bowel obstruction per ordering notes. Patient unable to give adequate history- Per ED notes- Per staff at group home, patient has been refusing to eat and drink since Saturday. EXAM: ABDOMEN - 2 VIEW COMPARISON:  09/19/2018 FINDINGS: There is marked dilatation of small bowel loops throughout the abdomen and pelvis, with small bowel loops measuring up to 6 centimeters in diameter. Small amount of air is identified within nondilated loops of colon. There is lucency within the RIGHT UPPER QUADRANT, atypical for bowel structure in raising the question of abscess or loculated air collection. No evidence for free intraperitoneal air beneath the diaphragm. Patchy opacity is identified at the RIGHT lung base. IMPRESSION: 1. High-grade small bowel obstruction. 2. Question of abscess or loculated extraluminal air collection  in the RIGHT upper quadrant. 3. RIGHT LOWER lobe atelectasis or infiltrate. These results were called by telephone at the time of interpretation on 08/03/2019 at 10:43 am to Dr. Sharyn CreamerMARK QUALE , who verbally acknowledged these results. Electronically Signed   By: Norva PavlovElizabeth  Brown M.D.   On: 08/03/2019 10:45   EKG:  EKG: normal EKG, normal sinus rhythm, unchanged from previous tracings, sinus arrhythmia. Vent. rate 81 BPM PR interval * ms QRS duration 124 ms QT/QTc 415/482 ms P-R-T axes 36 47 174 IMPRESSION AND PLAN:   68 y.o. male with past medical history of seizures, mental retardation, recurrent pneumonia, and small bowel obstruction presenting to the ED from group home with complaints of poor p.o.  intake, nausea and vomiting, and loose stools.  1.  Sepsis -likely secondary to aspiration pneumonia from nausea and vomiting - Admit to MedSurg unit - UA shows  - Urine cultures pending - Blood cultures pending - Start Empiric abx with vanc and Zosyn for now - IVFs and PRN bolus to keep MAP<6065mmHg or SBP <3690mmHg  2. High-grade small bowel obstruction - patient presenting with abdominal pain, nausea/vomiting and diarrhea.  CT abdomen pelvis showing small bowel obstruction.  No evidence of bowel ischemia/perforation - Prior hx of SBO - Start aggressive IVFs - Keep NPO - NGT for decompression. Will monitor if bloating improves, starts to pass gas, and for  bowel movement, - Close monitoring for severe pain of signs of infection indicating ischemia - PRN antiemetics - General surgery consult.  Discussed with on-call surgeon Dr. Maia Planintron  3. HTN  + Goal BP <130/80 - Continue home meds, while n.p.o. will use IV hydralazine  4. Seizure disorder - Continue Keppra and Lamictal  5. Non-severe (moderate) malnutrition in context of social or environmental circumstances  - Dietary consult  6. DVT prophylaxis ; SCD's - Hold anti-coagulation for pending procedure   All the records are reviewed and case discussed with ED provider. Management plans discussed with the patient, family and they are in agreement.  CODE STATUS: DNR  TOTAL TIME TAKING CARE OF THIS PATIENT: 50 minutes.    on 08/03/2019 at 12:56 PM  Webb SilversmithElizabeth , DNP, FNP-BC [facility] Nurse Practitioner Between 7am to 6pm - Pager [phone]- [phone]  After 6pm go to [email or web address] - password [email or web address]  [facility] [place] [facility]  Office  [phone]  CC: Primary care physician; Gracelyn NurseJohnston, John D, MD

## 2019-08-03 NOTE — ED Notes (Signed)
ED TO INPATIENT HANDOFF REPORT  ED Nurse Name and Phone #: Marisue Humble [phone]  S Name/Age/Gender Richard Gillespie 68 y.o. male Room/Bed: ED19A/ED19A  Code Status   Code Status: DNR  Home/SNF/Other Skilled nursing facility Patient oriented to: self Is this baseline? Yes   Triage Complete: Triage complete  Chief Complaint vomiting  Triage Note Per staff at group home, patient has been refusing to eat and drink since Saturday.  Staff member states patient has had 2 ensures since Saturday. Patient has vomited x 3 in the past 24 hours.  Staff reports patient had 1 episode of diarrhea this am, but generally has loose bowels due to history of intestinal problems.     Allergies Allergies  Allergen Reactions  . Zithromax [Azithromycin] Other (See Comments)    unknown    Level of Care/Admitting Diagnosis ED Disposition    ED Disposition Condition Comment   Admit  Hospital Area: [facility] [[id number]]  Level of Care: Med-Surg [16]  Covid Evaluation: Asymptomatic Screening Protocol (No Symptoms)  Diagnosis: Small bowel obstruction [facility]) [[id number]]  Admitting Physician: Cristie Hem  Attending Physician: Webb Silversmith ACHIENG [[id number]]  Estimated length of stay: past midnight tomorrow  Certification:: I certify this patient will need inpatient services for at least 2 midnights  PT Class (Do Not Modify): Inpatient [101]  PT Acc Code (Do Not Modify): Private [1]       B Medical/Surgery History Past Medical History:  Diagnosis Date  . Mental retardation   . Seizures (HCC)    Past Surgical History:  Procedure Laterality Date  . COLON SURGERY       A IV Location/Drains/Wounds Patient Lines/Drains/Airways Status   Active Line/Drains/Airways    Name:   Placement date:   Placement time:   Site:   Days:   Peripheral IV 08/03/19 Left Hand   08/03/19    1010    Hand   less than 1          Intake/Output Last 24 hours No intake or output  data in the 24 hours ending 08/03/19 1144  Labs/Imaging Results for orders placed or performed during the hospital encounter of 08/03/19 (from the past 48 hour(s))  Comprehensive metabolic panel     Status: Abnormal   Collection Time: 08/03/19  9:30 AM  Result Value Ref Range   Sodium 145 135 - 145 mmol/L   Potassium 3.4 (L) 3.5 - 5.1 mmol/L   Chloride 106 98 - 111 mmol/L   CO2 24 22 - 32 mmol/L   Glucose, Bld 176 (H) 70 - 99 mg/dL   BUN 41 (H) 8 - 23 mg/dL   Creatinine, Ser 1.15 0.61 - 1.24 mg/dL   Calcium 9.8 8.9 - 52.0 mg/dL   Total Protein 9.1 (H) 6.5 - 8.1 g/dL   Albumin 5.4 (H) 3.5 - 5.0 g/dL   AST 98 (H) 15 - 41 U/L   ALT 55 (H) 0 - 44 U/L   Alkaline Phosphatase 64 38 - 126 U/L   Total Bilirubin 0.8 0.3 - 1.2 mg/dL   GFR calc non Af Amer >60 >60 mL/min   GFR calc Af Amer >60 >60 mL/min   Anion gap 15 5 - 15    Comment: Performed at [facility], [address] [place], [place] [address]  Lactic acid, plasma     Status: None   Collection Time: 08/03/19  9:30 AM  Result Value Ref Range   Lactic Acid, Venous 1.5 0.5 -  1.9 mmol/L    Comment: Performed at [facility], [address] [place], [place] [address]  CBC with Differential     Status: Abnormal   Collection Time: 08/03/19  9:30 AM  Result Value Ref Range   WBC 14.4 (H) 4.0 - 10.5 K/uL   RBC 5.44 4.22 - 5.81 MIL/uL   Hemoglobin 16.0 13.0 - 17.0 g/dL   HCT 60.448.1 54.039.0 - 98.152.0 %   MCV 88.4 80.0 - 100.0 fL   MCH 29.4 26.0 - 34.0 pg   MCHC 33.3 30.0 - 36.0 g/dL   RDW 19.112.6 47.811.5 - 29.515.5 %   Platelets 257 150 - 400 K/uL   nRBC 0.0 0.0 - 0.2 %   Neutrophils Relative % 83 %   Neutro Abs 11.9 (H) 1.7 - 7.7 K/uL   Lymphocytes Relative 5 %   Lymphs Abs 0.6 (L) 0.7 - 4.0 K/uL   Monocytes Relative 12 %   Monocytes Absolute 1.7 (H) 0.1 - 1.0 K/uL   Eosinophils Relative 0 %   Eosinophils Absolute 0.0 0.0 - 0.5 K/uL   Basophils Relative 0 %   Basophils Absolute 0.0 0.0 - 0.1 K/uL   Immature Granulocytes  0 %   Abs Immature Granulocytes 0.05 0.00 - 0.07 K/uL    Comment: Performed at [facility], [address] [place], [place] [address]  Protime-INR     Status: None   Collection Time: 08/03/19  9:30 AM  Result Value Ref Range   Prothrombin Time 13.7 11.4 - 15.2 seconds   INR 1.1 0.8 - 1.2    Comment: (NOTE) INR goal varies based on device and disease states. Performed at [facility], [address] [place], [place] [address]    Ct Abdomen Pelvis W Contrast  Result Date: 08/03/2019 CLINICAL DATA:  Per staff at group home, patient has been refusing to eat and drink since Saturday. Staff member states patient has had 2 ensures since Saturday. Patient has vomited x 3 in the past 24 hours. Staff reports patient had 1 episode.*comment was truncated*^14400mL OMNIPAQUE IOHEXOL 300 MG/ML SOLN EXAM: CT ABDOMEN AND PELVIS WITH CONTRAST TECHNIQUE: Multidetector CT imaging of the abdomen and pelvis was performed using the standard protocol following bolus administration of intravenous contrast. CONTRAST:  100mL OMNIPAQUE IOHEXOL 300 MG/ML  SOLN COMPARISON:  CT of the abdomen and pelvis 09/12/2018 FINDINGS: Lower chest: There is atelectasis or early infiltrate within the RIGHT LOWER lobe. The heart is within the RIGHT side of the chest, warrant angled axis. Hepatobiliary: Liver is unremarkable. Normal appearance of the gallbladder. Pancreas: Unremarkable. No pancreatic ductal dilatation or surrounding inflammatory changes. Spleen: Normal in size without focal abnormality. Adrenals/Urinary Tract: Adrenal glands are normal in appearance. Kidneys are symmetric in size. There are small bilateral low-attenuation lesions within the kidneys, too small fully characterize but stable. The bladder and visualized portion of the urethra are normal. Stomach/Bowel: The stomach is distended and contains an air-fluid level in accounts for the air identified within the RIGHT UPPER QUADRANT on plain film. There  is no evidence for perforation. There is marked dilatation of small bowel loops which measure up to 6 centimeters. There is an abrupt transition point from dilated to normal caliber small bowel in the central abdomen rib there is also a twist of mesenteric vessels, best identified on sagittal image number 50/7. The distal small bowel loops and colon are unremarkable. Vascular/Lymphatic: No significant vascular findings are present. No enlarged abdominal or pelvic lymph nodes. Reproductive: Prostate  is unremarkable. Other: There is a small amount of fluid. There is no free intraperitoneal air. No abscess. Musculoskeletal: No acute or significant osseous findings. IMPRESSION: 1. High-grade small bowel obstruction with transition point in the central abdomen. Favor adhesions as a source for obstruction. 2. No evidence for perforation.  No abscess. 3. Small amount of free intraperitoneal fluid. 4. Right lower lobe atelectasis or early infiltrate. Dextrocardia, stable in appearance. Electronically Signed   By: Nolon Nations M.D.   On: 08/03/2019 11:00   Dg Chest Port 1 View  Result Date: 08/03/2019 CLINICAL DATA:  Suspected sepsis.  Vomiting. EXAM: PORTABLE CHEST 1 VIEW COMPARISON:  09/12/2018 FINDINGS: There is a scoliosis deformity. Diffuse pulmonary vascular congestion identified. Asymmetric elevation of left hemidiaphragm, unchanged. There is marked distension of the small bowel loops within the left upper quadrant of the abdomen concerning for small bowel obstruction. IMPRESSION: 1. Pulmonary vascular congestion. 2. Small bowel dilatation concerning for SBO. Electronically Signed   By: Kerby Moors M.D.   On: 08/03/2019 10:26   Dg Abd 2 Views  Result Date: 08/03/2019 CLINICAL DATA:  Possible small bowel obstruction per ordering notes. Patient unable to give adequate history- Per ED notes- Per staff at group home, patient has been refusing to eat and drink since Saturday. EXAM: ABDOMEN - 2 VIEW COMPARISON:   09/19/2018 FINDINGS: There is marked dilatation of small bowel loops throughout the abdomen and pelvis, with small bowel loops measuring up to 6 centimeters in diameter. Small amount of air is identified within nondilated loops of colon. There is lucency within the RIGHT UPPER QUADRANT, atypical for bowel structure in raising the question of abscess or loculated air collection. No evidence for free intraperitoneal air beneath the diaphragm. Patchy opacity is identified at the RIGHT lung base. IMPRESSION: 1. High-grade small bowel obstruction. 2. Question of abscess or loculated extraluminal air collection in the RIGHT upper quadrant. 3. RIGHT LOWER lobe atelectasis or infiltrate. These results were called by telephone at the time of interpretation on 08/03/2019 at 10:43 am to Dr. Delman Kitten , who verbally acknowledged these results. Electronically Signed   By: Nolon Nations M.D.   On: 08/03/2019 10:45    Pending Labs Unresulted Labs (From admission, onward)    Start     Ordered   08/04/19 0500  Protime-INR  Tomorrow morning,   STAT     08/03/19 1115   08/04/19 0500  Cortisol-am, blood  Tomorrow morning,   STAT     08/03/19 1115   08/04/19 0500  Procalcitonin  Tomorrow morning,   STAT     08/03/19 1115   08/04/19 0500  CBC  Tomorrow morning,   STAT     08/03/19 1115   08/04/19 0500  Comprehensive metabolic panel  Tomorrow morning,   STAT     08/03/19 1115   08/03/19 1018  SARS Coronavirus 2 [facility] order, Performed in [facility] hospital lab) Nasopharyngeal Nasopharyngeal Swab  (Symptomatic/High Risk of Exposure/Tier 1 Patients Labs with Precautions)  ONCE - STAT,   STAT    Question Answer Comment  Is this test for diagnosis or screening Diagnosis of ill patient   Symptomatic for COVID-19 as defined by CDC Yes   Date of Symptom Onset 08/01/2019   Hospitalized for COVID-19 No   Admitted to ICU for COVID-19 No   Previously tested for COVID-19 No   Resident in a congregate (group) care setting  No   Employed in healthcare setting No  08/03/19 1018   08/03/19 0921  Lactic acid, plasma  Now then every 2 hours,   STAT     08/03/19 0920   08/03/19 0921  Culture, blood (Routine x 2)  BLOOD CULTURE X 2,   STAT     08/03/19 0920   08/03/19 0921  Urinalysis, Complete w Microscopic  ONCE - STAT,   STAT     08/03/19 0920          Vitals/Pain Today's Vitals   08/03/19 0901 08/03/19 0915 08/03/19 1109 08/03/19 1124  BP: (!) 155/88  (!) 151/83 (!) 143/79  Pulse: (!) 122  80 80  Resp: (!) 22   17  Temp: 98.5 F (36.9 C)     TempSrc: Oral     SpO2: 94%  95% 95%  Weight:  68 kg    Height:  5\' 8"  (1.727 m)      Isolation Precautions Airborne and Contact precautions  Medications Medications  vancomycin (VANCOCIN) 1,500 mg in sodium chloride 0.9 % 500 mL IVPB (has no administration in time range)  piperacillin-tazobactam (ZOSYN) IVPB 3.375 g (3.375 g Intravenous New Bag/Given 08/03/19 1134)  cloNIDine (CATAPRES - Dosed in mg/24 hr) patch 0.1 mg (has no administration in time range)  LORazepam (ATIVAN) tablet 0.5 mg (has no administration in time range)  traZODone (DESYREL) tablet 300 mg (has no administration in time range)  medroxyPROGESTERone (PROVERA) tablet 10 mg (has no administration in time range)  bisacodyl (DULCOLAX) suppository 10 mg (has no administration in time range)  lactulose (CHRONULAC) 10 GM/15ML solution 10 g (has no administration in time range)  magnesium citrate solution 1 Bottle (has no administration in time range)  polyethylene glycol (MIRALAX / GLYCOLAX) packet 17 g (has no administration in time range)  senna (SENOKOT) tablet 17.2 mg (has no administration in time range)  carbamazepine (TEGRETOL XR) 12 hr tablet 200 mg (has no administration in time range)  lamoTRIgine (LAMICTAL) tablet 75 mg (has no administration in time range)  levETIRAcetam (KEPPRA) tablet 500 mg (has no administration in time range)  feeding supplement (ENSURE ENLIVE) (ENSURE  ENLIVE) liquid 237 mL (has no administration in time range)  Therems TABS 1 tablet (has no administration in time range)  loratadine (CLARITIN) tablet 10 mg (has no administration in time range)  mupirocin nasal ointment (BACTROBAN) 2 % 1 application (has no administration in time range)  0.9 %  sodium chloride infusion (has no administration in time range)  ondansetron (ZOFRAN) injection 4 mg (4 mg Intravenous Given 08/03/19 1012)  sodium chloride 0.9 % bolus 1,000 mL (1,000 mLs Intravenous New Bag/Given 08/03/19 1013)  iohexol (OMNIPAQUE) 300 MG/ML solution 100 mL (100 mLs Intravenous Contrast Given 08/03/19 1033)    Mobility walks High fall risk   Focused Assessments see assessments   R Recommendations: See Admitting Provider Note  Report given to:   Additional Notes: NG tube placement attempted, not successful due to pt being combative. Pt is from group home, developmentally delayed

## 2019-08-03 NOTE — Progress Notes (Signed)
NP notified: The patient has an irregular heart rhythm, SV rhythm 82 bpm

## 2019-08-03 NOTE — Progress Notes (Signed)
CODE SEPSIS - PHARMACY COMMUNICATION  **Broad Spectrum Antibiotics should be administered within 1 hour of Sepsis diagnosis**  Time Code Sepsis Called/Page Received: 1013  Antibiotics Ordered: Vancomycin,Zosyn  Time of 1st antibiotic administration: 2158  Additional action taken by pharmacy: none  If necessary, Name of Provider/Nurse Contacted: n/a    Pearla Dubonnet ,PharmD Clinical Pharmacist  08/03/2019  11:52 AM

## 2019-08-03 NOTE — Consult Note (Signed)
SURGICAL CONSULTATION NOTE   HISTORY OF PRESENT ILLNESS (HPI):  68 y.o. male presented to [facility] ED for evaluation of vomiting.  History taken from ED physician and consultant due to patient having any caretaker or family available bedside.  He has been reports that the patient has nausea and vomiting since 2 days ago.  They cannot recall when was the last time that he had a bowel movement or passing gas.  They denies any abdominal pain.  Unknown pain radiation.  No alleviating or aggravating factor.  He has been less active.  Denies any fever or chills.   Patient into the ED and abdominal x-ray was done showing significant small bowel dilation.  Radiologist reported suspected air versus abscess on the right upper quadrant.  I evaluated the images and I considered that what she is referring to the right upper quadrant collection is the patient's stomach.  CT scan of the abdomen was done showing significant bowel dilation without any free fluid or free air.  I personally evaluated the images.  Labs shows mildly leukocytosis to 14,000, elevated hemoglobin and normal platelets.  Patient also with mild hypokalemia and mildly increased creatinine.  Most of these findings are consistent with dehydration.  He was also found to have aspiration pneumonia.  Patient has history of colon surgery that is unknown and there is no report.  Patient has on previous admission due to small bowel obstruction.  Surgery is consulted by Dr. Jacqualine Code in this context for evaluation and management of small bowel obstruction.  PAST MEDICAL HISTORY (PMH):  Past Medical History:  Diagnosis Date  . Mental retardation   . Seizures ([place])      PAST SURGICAL HISTORY ([place]):  Past Surgical History:  Procedure Laterality Date  . COLON SURGERY       MEDICATIONS:  Prior to Admission medications   Medication Sig Start Date End Date Taking? Authorizing Provider  carbamazepine (TEGRETOL XR) 200 MG 12 hr tablet Take 200 mg by mouth 4  (four) times daily.    Yes [provider]  cloNIDine (CATAPRES - DOSED IN MG/24 HR) 0.1 mg/24hr patch Place 0.1 mg onto the skin once a week. 01/30/19 01/30/20 Yes [provider]  lamoTRIgine (LAMICTAL) 150 MG tablet Take 75 mg by mouth 2 (two) times daily.    Yes [provider]  levETIRAcetam (KEPPRA) 500 MG tablet Take 1 tablet (500 mg total) by mouth 2 (two) times daily. 09/24/18  Yes Mayo, Pete Pelt, MD  loratadine (CLARITIN) 10 MG tablet Take 10 mg by mouth daily.   Yes [provider]  LORazepam (ATIVAN) 0.5 MG tablet Take 0.5 mg by mouth as needed for anxiety (max 4 tablets daily).    Yes [provider]  medroxyPROGESTERone (PROVERA) 10 MG tablet Take 10 mg by mouth daily.   Yes [provider]  Multiple Vitamin (THEREMS) TABS Take 1 tablet by mouth daily.   Yes [provider]  traZODone (DESYREL) 150 MG tablet Take 300 mg by mouth at bedtime.   Yes [provider]  bisacodyl (DULCOLAX) 10 MG suppository Place 10 mg rectally as needed for moderate constipation.    [provider]  feeding supplement, ENSURE ENLIVE, (ENSURE ENLIVE) LIQD Take 237 mLs by mouth 2 (two) times daily between meals. Patient not taking: Reported on 09/12/2018 01/16/17   Theodoro Grist, MD  lactulose (CHRONULAC) 10 GM/15ML solution Take 10 g by mouth 2 (two) times daily as needed for mild constipation.  [provider]  magnesium citrate SOLN Take 1 Bottle by mouth daily as needed for severe constipation (at least 2 days of no BM).     [provider]  mupirocin nasal ointment (BACTROBAN) 2 % Place 1 application into the nose 2 (two) times daily. Use one-half of tube in each nostril twice daily for five (5) days. After application, press sides of nose together and gently massage.    [provider]  polyethylene glycol (MIRALAX / GLYCOLAX) packet Take 17 g by mouth daily as needed for mild constipation or  moderate constipation.     [provider]  senna (SENOKOT) 8.6 MG TABS tablet Take 2 tablets by mouth 2 (two) times daily.     [provider]     ALLERGIES:  Allergies  Allergen Reactions  . Zithromax [Azithromycin] Other (See Comments)    unknown     SOCIAL HISTORY:  Social History   Socioeconomic History  . Marital status: Single    Spouse name: Not on file  . Number of children: Not on file  . Years of education: Not on file  . Highest education level: Not on file  Occupational History  . Not on file  Social Needs  . Financial resource strain: Not on file  . Food insecurity    Worry: Not on file    Inability: Not on file  . Transportation needs    Medical: Not on file    Non-medical: Not on file  Tobacco Use  . Smoking status: Never Smoker  . Smokeless tobacco: Never Used  Substance and Sexual Activity  . Alcohol use: No  . Drug use: Never  . Sexual activity: Not on file  Lifestyle  . Physical activity    Days per week: Not on file    Minutes per session: Not on file  . Stress: Not on file  Relationships  . Social Musicianconnections    Talks on phone: Not on file    Gets together: Not on file    Attends religious service: Not on file    Active member of club or organization: Not on file    Attends meetings of clubs or organizations: Not on file    Relationship status: Not on file  . Intimate partner violence    Fear of current or ex partner: Not on file    Emotionally abused: Not on file    Physically abused: Not on file    Forced sexual activity: Not on file  Other Topics Concern  . Not on file  Social History Narrative  . Not on file    The patient currently resides (home / rehab facility / nursing home): Group home  FAMILY HISTORY:  History reviewed. No pertinent family history.   REVIEW OF SYSTEMS: History as per ED physician and nurses. Constitutional: denies weight loss, fever, chills, or sweats.  Developmental delay  (severe) Eyes: denies any other vision changes, history of eye injury  ENT: denies sore throat, hearing problems  Respiratory: denies shortness of breath, wheezing  Cardiovascular: denies chest pain, palpitations  Gastrointestinal: No sign of abdominal pain, he was reported to have multiple episode of nausea and vomiting Genitourinary: denies burning with urination or urinary frequency Musculoskeletal: denies any other joint pains or cramps  Skin: denies any other rashes or skin discolorations  Neurological: Positive for developmental delay and seizures Psychiatric: denies any other depression, anxiety   All other review of systems were negative   VITAL SIGNS:  Temp:  [  97.5 F (36.4 C)-98.5 F (36.9 C)] 97.5 F (36.4 C) (09/07 1241) Pulse Rate:  [74-122] 74 (09/07 1725) Resp:  [17-22] 17 (09/07 1124) BP: (104-155)/(63-88) 131/63 (09/07 1725) SpO2:  [91 %-95 %] 91 % (09/07 1241) Weight:  [68 kg] 68 kg (09/07 0915)     Height: 5\' 8"  (172.7 cm) Weight: 68 kg BMI (Calculated): 22.81   INTAKE/OUTPUT:  This shift: No intake/output data recorded.  Last 2 shifts: @IOLAST2SHIFTS @   PHYSICAL EXAM:  Constitutional:  --Patient lying down in no acute distress, comfortable Eyes:  -- Pupils equally round and reactive to light  -- No scleral icterus  Ear, nose, and throat:  -- No jugular venous distension  Pulmonary:  -- No crackles  -- Equal breath sounds bilaterally -- Breathing non-labored at rest Cardiovascular:  -- S1, S2 present  -- No pericardial rubs Gastrointestinal:  -- Abdomen soft, nontender, distended, no guarding or rebound tenderness.  There is a midline scar. -- No abdominal masses appreciated, pulsatile or otherwise  Musculoskeletal and Integumentary:  -- Wounds or skin discoloration: None appreciated -- Extremities: B/L UE and LE FROM, hands and feet warm, no edema  Neurologic:  --No focal motor or sensory deficit   Labs:  CBC Latest Ref Rng & Units 08/03/2019  09/24/2018 09/23/2018  WBC 4.0 - 10.5 K/uL 14.4(H) 11.6(H) 13.5(H)  Hemoglobin 13.0 - 17.0 g/dL 55.9 74.1 63.8  Hematocrit 39.0 - 52.0 % 48.1 39.2 39.1  Platelets 150 - 400 K/uL 257 188 179   CMP Latest Ref Rng & Units 08/03/2019 09/24/2018 09/23/2018  Glucose 70 - 99 mg/dL 453(M) 468(E) 321(Y)  BUN 8 - 23 mg/dL 24(M) 19 22  Creatinine 0.61 - 1.24 mg/dL 2.50 0.37 0.48(G)  Sodium 135 - 145 mmol/L 145 133(L) 137  Potassium 3.5 - 5.1 mmol/L 3.4(L) 4.6 3.8  Chloride 98 - 111 mmol/L 106 99 102  CO2 22 - 32 mmol/L 24 26 27   Calcium 8.9 - 10.3 mg/dL 9.8 8.9(V) 6.9(I)  Total Protein 6.5 - 8.1 g/dL 9.1(H) - -  Total Bilirubin 0.3 - 1.2 mg/dL 0.8 - -  Alkaline Phos 38 - 126 U/L 64 - -  AST 15 - 41 U/L 98(H) - -  ALT 0 - 44 U/L 55(H) - -   Imaging studies:  EXAM: CT ABDOMEN AND PELVIS WITH CONTRAST  TECHNIQUE: Multidetector CT imaging of the abdomen and pelvis was performed using the standard protocol following bolus administration of intravenous contrast.  CONTRAST:  OMNIPAQUE IOHEXOL 300 MG/ML  SOLN  COMPARISON:  CT of the abdomen and pelvis 09/12/2018  FINDINGS: Lower chest: There is atelectasis or early infiltrate within the RIGHT LOWER lobe. The heart is within the RIGHT side of the chest, warrant angled axis.  Hepatobiliary: Liver is unremarkable. Normal appearance of the gallbladder.  Pancreas: Unremarkable. No pancreatic ductal dilatation or surrounding inflammatory changes.  Spleen: Normal in size without focal abnormality.  Adrenals/Urinary Tract: Adrenal glands are normal in appearance. Kidneys are symmetric in size. There are small bilateral low-attenuation lesions within the kidneys, too small fully characterize but stable. The bladder and visualized portion of the urethra are normal.  Stomach/Bowel: The stomach is distended and contains an air-fluid level in accounts for the air identified within the RIGHT UPPER QUADRANT on plain film. There is  no evidence for perforation.  There is marked dilatation of small bowel loops which measure up to 6 centimeters. There is an abrupt transition point from dilated to normal caliber small bowel in the  central abdomen rib there is also a twist of mesenteric vessels, best identified on sagittal image number 50/7. The distal small bowel loops and colon are unremarkable.  Vascular/Lymphatic: No significant vascular findings are present. No enlarged abdominal or pelvic lymph nodes.  Reproductive: Prostate is unremarkable.  Other: There is a small amount of fluid. There is no free intraperitoneal air. No abscess.  Musculoskeletal: No acute or significant osseous findings.  IMPRESSION: 1. High-grade small bowel obstruction with transition point in the central abdomen. Favor adhesions as a source for obstruction. 2. No evidence for perforation.  No abscess. 3. Small amount of free intraperitoneal fluid. 4. Right lower lobe atelectasis or early infiltrate. Dextrocardia, stable in appearance.   Electronically Signed   By: Norva PavlovElizabeth  Brown M.D.   On: 08/03/2019 11:00  Assessment/Plan:  68 y.o. male with high-grade small bowel obstruction, complicated by pertinent comorbidities including severe developmental delay and seizure disorder, aspiration pneumonia.  Difficult patient due to his severe developmental delay and unable to take adequate history.  Patient unable to report if he is having further abdominal pain or if he is getting better.  He has high-grade bowel obstruction as per CT scan.  NGT was able to be placed by nurse and a significant amount of intestinal content was drained.  Patient is laying down comfortable.  There is mild leukocytosis, will follow with serial labs after hydration.  We will also follow with clinical exam.  Will be difficult due to the severe mental retardation.  May need to do Gastrografin challenge to have a more objective evaluation.  Appreciate  hospitalist admission for management of aspiration pneumonia and his medical conditions.  If obstruction does not resolve patient might need surgical management for small bowel obstruction.  Ideally he will be optimized from the pneumonia and his medical condition if possible.  Will follow closely.  Gae GallopEdgardo Cintrn-Daz, MD

## 2019-08-03 NOTE — ED Notes (Signed)
Per patient's caregiver, patient had urinary incontinence just now and it appears there may have been blood in patient's urine.  Per caregiver, it is unusual for patient to have incontinence.

## 2019-08-03 NOTE — ED Provider Notes (Signed)
[facility] Emergency Department Provider Note   ____________________________________________   First MD Initiated Contact with Patient 08/03/19 1009     (approximate)  I have reviewed the triage vital signs and the nursing notes.   HISTORY  Chief Complaint Emesis  EM caveat: Some limitation due to severe mental retardation  HPI Richard Gillespie is a 68 y.o. male who per his care home staff who is with him started to become sick on Saturday.  He started vomiting that evening, and over the last 48 hours has been vomited up yellow emesis multiple times.  He is been back and forth to the bathroom all night.  He does not appear to be in pain, but has appeared to be vomiting.  He had his temperature checked several times and not had a fever.  The group home has been isolating from [place] since March, and he has no known exposure  The patient denies pain.  Reports he just wants to rest on his side.  He is however throwing up frequently   Past Medical History:  Diagnosis Date   Mental retardation    Seizures ([place])     Patient Active Problem List   Diagnosis Date Noted   Small bowel obstruction ([place]) 08/03/2019   Malnutrition of moderate degree 09/17/2018   SBO (small bowel obstruction) ([place]) 09/12/2018   Aspiration pneumonia ([place]) 09/12/2018   Seizure disorder ([place]) 09/12/2018   Sepsis ([place]) 09/12/2018   E coli infection 01/15/2017   Agitation 01/15/2017   Diarrhea 01/15/2017   Hypokalemia 01/15/2017   Hyponatremia 01/15/2017   Leukocytosis 01/15/2017   Hyperglycemia 01/15/2017   UTI (urinary tract infection) 01/12/2017    Past Surgical History:  Procedure Laterality Date   COLON SURGERY      Prior to Admission medications   Medication Sig Start Date End Date Taking? Authorizing Provider  carbamazepine (TEGRETOL XR) 200 MG 12 hr tablet Take 200 mg by mouth 4 (four) times daily.    Yes [provider]  cloNIDine (CATAPRES  - DOSED IN MG/24 HR) 0.1 mg/24hr patch Place 0.1 mg onto the skin once a week. 01/30/19 01/30/20 Yes [provider]  lamoTRIgine (LAMICTAL) 150 MG tablet Take 75 mg by mouth 2 (two) times daily.    Yes [provider]  levETIRAcetam (KEPPRA) 500 MG tablet Take 1 tablet (500 mg total) by mouth 2 (two) times daily. 09/24/18  Yes Mayo, Pete Pelt, MD  loratadine (CLARITIN) 10 MG tablet Take 10 mg by mouth daily.   Yes [provider]  LORazepam (ATIVAN) 0.5 MG tablet Take 0.5 mg by mouth as needed for anxiety (max 4 tablets daily).    Yes [provider]  medroxyPROGESTERone (PROVERA) 10 MG tablet Take 10 mg by mouth daily.   Yes [provider]  Multiple Vitamin (THEREMS) TABS Take 1 tablet by mouth daily.   Yes [provider]  traZODone (DESYREL) 150 MG tablet Take 300 mg by mouth at bedtime.   Yes [provider]  bisacodyl (DULCOLAX) 10 MG suppository Place 10 mg rectally as needed for moderate constipation.    [provider]  feeding supplement, ENSURE ENLIVE, (ENSURE ENLIVE) LIQD Take 237 mLs by mouth 2 (two) times daily between meals. Patient not taking: Reported on 09/12/2018 01/16/17   Theodoro Grist, MD  lactulose (CHRONULAC) 10 GM/15ML solution Take 10 g by mouth 2 (two) times daily as needed for mild constipation.     [provider]  magnesium citrate  SOLN Take 1 Bottle by mouth daily as needed for severe constipation (at least 2 days of no BM).     [provider]  mupirocin nasal ointment (BACTROBAN) 2 % Place 1 application into the nose 2 (two) times daily. Use one-half of tube in each nostril twice daily for five (5) days. After application, press sides of nose together and gently massage.    [provider]  polyethylene glycol (MIRALAX / GLYCOLAX) packet Take 17 g by mouth daily as needed for mild constipation or moderate constipation.     [provider]  senna (SENOKOT) 8.6 MG  TABS tablet Take 2 tablets by mouth 2 (two) times daily.     [provider]    Allergies Zithromax [azithromycin]  History reviewed. No pertinent family history.  Social History Social History   Tobacco Use   Smoking status: Never Smoker   Smokeless tobacco: Never Used  Substance Use Topics   Alcohol use: No   Drug use: Never    Review of Systems  EM caveat   ____________________________________________   PHYSICAL EXAM:  VITAL SIGNS: ED Triage Vitals  Enc Vitals Group     BP 08/03/19 0901 (!) 155/88     Pulse Rate 08/03/19 0901 (!) 122     Resp 08/03/19 0901 (!) 22     Temp 08/03/19 0901 98.5 F (36.9 C)     Temp Source 08/03/19 0901 Oral     SpO2 08/03/19 0901 94 %     Weight 08/03/19 0915 150 lb (68 kg)     Height 08/03/19 0915 5\' 8"  (1.727 m)     Head Circumference --      Peak Flow --      Pain Score --      Pain Loc --      Pain Edu? --      Excl. in GC? --     Constitutional: Alert and oriented and he is calm and pleasant, except when he has to vomit in which case he is having occasional active emesis of yellowish gastric contents without blood.  Moderately ill-appearing. Eyes: Conjunctivae are normal. Head: Atraumatic. Nose: No congestion/rhinnorhea. Mouth/Throat: Mucous membranes are moist. Neck: No stridor.  Cardiovascular: Moderately tachycardic rate, regular rhythm. Grossly normal heart sounds.  Good peripheral circulation. Respiratory: Normal respiratory effort.  No retractions. Lungs CTAB. Gastrointestinal: Soft and moderately distended.  Denies pain in any quadrant, but does appear distended. Musculoskeletal: No lower extremity tenderness nor edema. Neurologic: Soft-spoken, intentional and prefers to rest on his side in a recovery position. No gross focal neurologic deficits are appreciated.  Skin:  Skin is warm, dry and intact. No rash noted. Psychiatric: Mood and affect are  calm  ____________________________________________   LABS (all labs ordered are listed, but only abnormal results are displayed)  Labs Reviewed  COMPREHENSIVE METABOLIC PANEL - Abnormal; Notable for the following components:      Result Value   Potassium 3.4 (*)    Glucose, Bld 176 (*)    BUN 41 (*)    Total Protein 9.1 (*)    Albumin 5.4 (*)    AST 98 (*)    ALT 55 (*)    All other components within normal limits  LACTIC ACID, PLASMA - Abnormal; Notable for the following components:   Lactic Acid, Venous 2.8 (*)    All other components within normal limits  CBC WITH DIFFERENTIAL/PLATELET - Abnormal; Notable for the following components:   WBC 14.4 (*)  Neutro Abs 11.9 (*)    Lymphs Abs 0.6 (*)    Monocytes Absolute 1.7 (*)    All other components within normal limits  SARS CORONAVIRUS 2 (HOSPITAL ORDER, PERFORMED IN [place] [facility])  CULTURE, BLOOD (ROUTINE X 2)  CULTURE, BLOOD (ROUTINE X 2)  LACTIC ACID, PLASMA  PROTIME-INR  URINALYSIS, COMPLETE (UACMP) WITH MICROSCOPIC   ____________________________________________  EKG   ____________________________________________  RADIOLOGY  Ct Abdomen Pelvis W Contrast  Result Date: 08/03/2019 CLINICAL DATA:  Per staff at group home, patient has been refusing to eat and drink since Saturday. Staff member states patient has had 2 ensures since Saturday. Patient has vomited x 3 in the past 24 hours. Staff reports patient had 1 episode.*comment was truncated*^16500mL OMNIPAQUE IOHEXOL 300 MG/ML SOLN EXAM: CT ABDOMEN AND PELVIS WITH CONTRAST TECHNIQUE: Multidetector CT imaging of the abdomen and pelvis was performed using the standard protocol following bolus administration of intravenous contrast. CONTRAST:  100mL OMNIPAQUE IOHEXOL 300 MG/ML  SOLN COMPARISON:  CT of the abdomen and pelvis 09/12/2018 FINDINGS: Lower chest: There is atelectasis or early infiltrate within the RIGHT LOWER lobe. The heart is within the RIGHT side  of the chest, warrant angled axis. Hepatobiliary: Liver is unremarkable. Normal appearance of the gallbladder. Pancreas: Unremarkable. No pancreatic ductal dilatation or surrounding inflammatory changes. Spleen: Normal in size without focal abnormality. Adrenals/Urinary Tract: Adrenal glands are normal in appearance. Kidneys are symmetric in size. There are small bilateral low-attenuation lesions within the kidneys, too small fully characterize but stable. The bladder and visualized portion of the urethra are normal. Stomach/Bowel: The stomach is distended and contains an air-fluid level in accounts for the air identified within the RIGHT UPPER QUADRANT on plain film. There is no evidence for perforation. There is marked dilatation of small bowel loops which measure up to 6 centimeters. There is an abrupt transition point from dilated to normal caliber small bowel in the central abdomen rib there is also a twist of mesenteric vessels, best identified on sagittal image number 50/7. The distal small bowel loops and colon are unremarkable. Vascular/Lymphatic: No significant vascular findings are present. No enlarged abdominal or pelvic lymph nodes. Reproductive: Prostate is unremarkable. Other: There is a small amount of fluid. There is no free intraperitoneal air. No abscess. Musculoskeletal: No acute or significant osseous findings. IMPRESSION: 1. High-grade small bowel obstruction with transition point in the central abdomen. Favor adhesions as a source for obstruction. 2. No evidence for perforation.  No abscess. 3. Small amount of free intraperitoneal fluid. 4. Right lower lobe atelectasis or early infiltrate. Dextrocardia, stable in appearance. Electronically Signed   By: Norva PavlovElizabeth  Brown M.D.   On: 08/03/2019 11:00   Dg Chest Port 1 View  Result Date: 08/03/2019 CLINICAL DATA:  Suspected sepsis.  Vomiting. EXAM: PORTABLE CHEST 1 VIEW COMPARISON:  09/12/2018 FINDINGS: There is a scoliosis deformity. Diffuse  pulmonary vascular congestion identified. Asymmetric elevation of left hemidiaphragm, unchanged. There is marked distension of the small bowel loops within the left upper quadrant of the abdomen concerning for small bowel obstruction. IMPRESSION: 1. Pulmonary vascular congestion. 2. Small bowel dilatation concerning for SBO. Electronically Signed   By: Signa Kellaylor  Stroud M.D.   On: 08/03/2019 10:26   Dg Abd 2 Views  Result Date: 08/03/2019 CLINICAL DATA:  Possible small bowel obstruction per ordering notes. Patient unable to give adequate history- Per ED notes- Per staff at group home, patient has been refusing to eat and drink since Saturday. EXAM: ABDOMEN - 2  VIEW COMPARISON:  09/19/2018 FINDINGS: There is marked dilatation of small bowel loops throughout the abdomen and pelvis, with small bowel loops measuring up to 6 centimeters in diameter. Small amount of air is identified within nondilated loops of colon. There is lucency within the RIGHT UPPER QUADRANT, atypical for bowel structure in raising the question of abscess or loculated air collection. No evidence for free intraperitoneal air beneath the diaphragm. Patchy opacity is identified at the RIGHT lung base. IMPRESSION: 1. High-grade small bowel obstruction. 2. Question of abscess or loculated extraluminal air collection in the RIGHT upper quadrant. 3. RIGHT LOWER lobe atelectasis or infiltrate. These results were called by telephone at the time of interpretation on 08/03/2019 at 10:43 am to Dr. Sharyn Creamer , who verbally acknowledged these results. Electronically Signed   By: Norva Pavlov M.D.   On: 08/03/2019 10:45     CT abdomen pelvis pending at time of admission, general surgery will follow-up on results as well as medicine service. ____________________________________________   PROCEDURES  Procedure(s) performed: None  Procedures  Critical Care performed: Yes, see critical care note(s)  CRITICAL CARE Performed by: Sharyn Creamer   Total critical care time: 35 minutes  Critical care time was exclusive of separately billable procedures and treating other patients.  Critical care was necessary to treat or prevent imminent or life-threatening deterioration.  Critical care was time spent personally by me on the following activities: development of treatment plan with patient and/or surrogate as well as nursing, discussions with consultants, evaluation of patient's response to treatment, examination of patient, obtaining history from patient or surrogate, ordering and performing treatments and interventions, ordering and review of laboratory studies, ordering and review of radiographic studies, pulse oximetry and re-evaluation of patient's condition.  ____________________________________________   INITIAL IMPRESSION / ASSESSMENT AND PLAN / ED COURSE  Pertinent labs & imaging results that were available during my care of the patient were reviewed by me and considered in my medical decision making (see chart for details).   Patient presents for evaluation for frequent emesis.  Abdomen slightly distended.  Clinical history seems to support possibility of recurrent bowel obstruction.  Additionally review of his x-ray and lab work shows leukocytosis which could be due to obstruction, abdominal pathology, vomiting, or infection.  His chest x-ray appears to have evidence of right lower lobe infiltration, and I will start him on broad antibiotics.  Code sepsis initiated.  Fluid bolus ordered.  General surgery consulted.  General surgery recommends placement of NG tube  Clinical Course as of Aug 02 1536  [facility] Aug 03, 2019  1017 Patient be admitted to medical service.  Consultation to general surgery, Dr. Maia Plan will see patient in consult   [MQ]  1018 Code sepsis initiated.  I am concerned the patient likely has aspiration pneumonia and reviewed his x-ray, also concern for possible bowel obstruction.  Surgery is consulted.   Hospitalist service admitting.   [MQ]    Clinical Course User Index [MQ] Sharyn Creamer, MD    ----------------------------------------- 10:52 AM on 08/03/2019 -----------------------------------------  Discussed abdominal imaging x-ray with radiologist.  Also with Dr. Maia Plan (via [place]).  General surgery request CT scan, and will see patient in consultation.  Currently also seeing antiemetic, treating for sepsis.  Pending NG tube placement ____________________________________________   FINAL CLINICAL IMPRESSION(S) / ED DIAGNOSES  Final diagnoses:  Sepsis (HCC)  Nasogastric tube present  Encounter for imaging study to confirm nasogastric (NG) tube placement        Note:  This document was prepared using Conservation officer, historic buildings and may include unintentional dictation errors       Sharyn Creamer, MD 08/03/19 1538

## 2019-08-03 NOTE — ED Notes (Signed)
Attempted IV x2, unsuccessful.  

## 2019-08-03 NOTE — ED Notes (Signed)
NG tube insertion attempted, pt became combative and agitated. NG tube placement not successful.

## 2019-08-03 NOTE — Progress Notes (Signed)
NP notified: do you want me to hold the patients oral meds Tegretol, clonidine, lamotrigine, medroxyprogesterone, multivitamins and bactroven for now. HE has been putting out alot from his NG tube so far 1255ml and there is still more being drain from his abdomen, or would you like to switch some of those meds to IV.

## 2019-08-04 ENCOUNTER — Inpatient Hospital Stay: Payer: Medicare Other

## 2019-08-04 LAB — BASIC METABOLIC PANEL
Anion gap: 13 (ref 5–15)
Anion gap: 14 (ref 5–15)
BUN: 30 mg/dL — ABNORMAL HIGH (ref 8–23)
BUN: 32 mg/dL — ABNORMAL HIGH (ref 8–23)
CO2: 26 mmol/L (ref 22–32)
CO2: 28 mmol/L (ref 22–32)
Calcium: 9.1 mg/dL (ref 8.9–10.3)
Calcium: 9.1 mg/dL (ref 8.9–10.3)
Chloride: 111 mmol/L (ref 98–111)
Chloride: 107 mmol/L (ref 98–111)
Creatinine, Ser: 0.89 mg/dL (ref 0.61–1.24)
Creatinine, Ser: 0.94 mg/dL (ref 0.61–1.24)
GFR calc Af Amer: >60 mL/min (ref >60)
GFR calc Af Amer: >60 mL/min (ref >60)
GFR calc non Af Amer: >60 mL/min (ref >60)
GFR calc non Af Amer: >60 mL/min (ref >60)
Glucose, Bld: 130 mg/dL — ABNORMAL HIGH (ref 70–99)
Glucose, Bld: 178 mg/dL — ABNORMAL HIGH (ref 70–99)
Potassium: 3.5 mmol/L (ref 3.5–5.1)
Potassium: 3.3 mmol/L — ABNORMAL LOW (ref 3.5–5.1)
Sodium: 150 mmol/L — ABNORMAL HIGH (ref 135–145)
Sodium: 149 mmol/L — ABNORMAL HIGH (ref 135–145)

## 2019-08-04 LAB — CBC
HCT: 44.4 % (ref 39.0–52.0)
Hemoglobin: 14.6 g/dL (ref 13.0–17.0)
MCH: 29.3 pg (ref 26.0–34.0)
MCHC: 32.9 g/dL (ref 30.0–36.0)
MCV: 89.2 fL (ref 80.0–100.0)
Platelets: 198 10*3/uL (ref 150–400)
RBC: 4.98 MIL/uL (ref 4.22–5.81)
RDW: 12.8 % (ref 11.5–15.5)
WBC: 8.1 10*3/uL (ref 4.0–10.5)
nRBC: 0 % (ref 0.0–0.2)

## 2019-08-04 LAB — COMPREHENSIVE METABOLIC PANEL
ALT: 56 U/L — ABNORMAL HIGH (ref 0–44)
AST: 83 U/L — ABNORMAL HIGH (ref 15–41)
Albumin: 4.2 g/dL (ref 3.5–5.0)
Alkaline Phosphatase: 46 U/L (ref 38–126)
Anion gap: 10 (ref 5–15)
BUN: 29 mg/dL — ABNORMAL HIGH (ref 8–23)
CO2: 27 mmol/L (ref 22–32)
Calcium: 8.8 mg/dL — ABNORMAL LOW (ref 8.9–10.3)
Chloride: 112 mmol/L — ABNORMAL HIGH (ref 98–111)
Creatinine, Ser: 0.88 mg/dL (ref 0.61–1.24)
GFR calc Af Amer: >60 mL/min (ref >60)
GFR calc non Af Amer: >60 mL/min (ref >60)
Glucose, Bld: 122 mg/dL — ABNORMAL HIGH (ref 70–99)
Potassium: 3.3 mmol/L — ABNORMAL LOW (ref 3.5–5.1)
Sodium: 149 mmol/L — ABNORMAL HIGH (ref 135–145)
Total Bilirubin: 0.9 mg/dL (ref 0.3–1.2)
Total Protein: 7.3 g/dL (ref 6.5–8.1)

## 2019-08-04 LAB — URINALYSIS, COMPLETE (UACMP) WITH MICROSCOPIC
Bilirubin Urine: NEGATIVE
Glucose, UA: NEGATIVE mg/dL
Ketones, ur: 5 mg/dL — AB
Leukocytes,Ua: NEGATIVE
Nitrite: POSITIVE — AB
Protein, ur: 100 mg/dL — AB
Specific Gravity, Urine: >1.046 — ABNORMAL HIGH (ref 1.005–1.030)
pH: 5 (ref 5.0–8.0)

## 2019-08-04 LAB — LACTIC ACID, PLASMA
Lactic Acid, Venous: 1.2 mmol/L (ref 0.5–1.9)
Lactic Acid, Venous: 1.2 mmol/L (ref 0.5–1.9)

## 2019-08-04 LAB — PROTIME-INR
INR: 1 (ref 0.8–1.2)
Prothrombin Time: 13.5 seconds (ref 11.4–15.2)

## 2019-08-04 LAB — GLUCOSE, CAPILLARY: Glucose-Capillary: 123 mg/dL — ABNORMAL HIGH (ref 70–99)

## 2019-08-04 LAB — PROCALCITONIN: Procalcitonin: <0.1 ng/mL

## 2019-08-04 LAB — CORTISOL-AM, BLOOD: Cortisol - AM: 45 ug/dL — ABNORMAL HIGH (ref 6.7–22.6)

## 2019-08-04 MED ORDER — METOPROLOL TARTRATE 5 MG/5ML IV SOLN
5.0000 mg | Freq: Four times a day (QID) | INTRAVENOUS | Status: DC | PRN
  Administered 2019-08-04: 5 mg via INTRAVENOUS
  Filled 2019-08-04: qty 5

## 2019-08-04 MED ORDER — DIATRIZOATE MEGLUMINE & SODIUM 66-10 % PO SOLN
90.0000 mL | Freq: Once | ORAL | Status: AC
  Administered 2019-08-04: 90 mL via NASOGASTRIC

## 2019-08-04 MED ORDER — POTASSIUM CHLORIDE IN NACL 20-0.45 MEQ/L-% IV SOLN
INTRAVENOUS | Status: DC
  Administered 2019-08-04: 10:00:00 via INTRAVENOUS
  Filled 2019-08-04 (×3): qty 1000

## 2019-08-04 MED ORDER — POTASSIUM CL IN DEXTROSE 5% 20 MEQ/L IV SOLN
20.0000 meq | INTRAVENOUS | Status: DC
  Administered 2019-08-04 – 2019-08-05 (×3): 20 meq via INTRAVENOUS
  Filled 2019-08-04 (×6): qty 1000

## 2019-08-04 MED ORDER — LORAZEPAM 2 MG/ML IJ SOLN
1.0000 mg | Freq: Once | INTRAMUSCULAR | Status: AC
  Administered 2019-08-04: 19:00:00 1 mg via INTRAVENOUS
  Filled 2019-08-04: qty 1

## 2019-08-04 MED ORDER — SODIUM CHLORIDE 0.9 % IV SOLN
INTRAVENOUS | Status: DC | PRN
  Administered 2019-08-04 – 2019-08-06 (×3): 250 mL via INTRAVENOUS
  Administered 2019-08-06 – 2019-08-07 (×2): 30 mL via INTRAVENOUS
  Administered 2019-08-08: 500 mL via INTRAVENOUS
  Administered 2019-08-09 – 2019-08-12 (×6): 250 mL via INTRAVENOUS

## 2019-08-04 NOTE — Progress Notes (Signed)
Dr. Estanislado Pandy approved for medications to be given via NG tube.

## 2019-08-04 NOTE — Progress Notes (Signed)
[place] Hospital Day(s): 1.   Post op day(s):  Marland Kitchen   Interval History: Patient seen and examined, no acute events or new complaints overnight.  As per nurse patient has been drained in bed comfortably.  There has been no issues.  There has been no bowel movement.  Unable to assess pain radiation.  Unable to assess any alleviating aggravating factor.  Vital signs in last 24 hours: [min-max] current  Temp:  [98.5 F (36.9 C)-99 F (37.2 C)] 98.5 F (36.9 C) (09/08 1413) Pulse Rate:  [79-113] 95 (09/08 1502) Resp:  [16-18] 18 (09/08 1413) BP: (116-133)/(59-83) 132/81 (09/08 1413) SpO2:  [93 %-98 %] 93 % (09/08 1413)     Height: 5\' 8"  (172.7 cm) Weight: 68 kg BMI (Calculated): 22.81   Physical Exam:  Constitutional: alert, cooperative and no distress  Respiratory: breathing non-labored at rest  Cardiovascular: regular rate and sinus rhythm  Gastrointestinal: soft, non-tender, and distended  Labs:  CBC Latest Ref Rng & Units 08/04/2019 08/03/2019 09/24/2018  WBC 4.0 - 10.5 K/uL 8.1 14.4(H) 11.6(H)  Hemoglobin 13.0 - 17.0 g/dL 14.6 16.0 13.3  Hematocrit 39.0 - 52.0 % 44.4 48.1 39.2  Platelets 150 - 400 K/uL 198 257 188   CMP Latest Ref Rng & Units 08/04/2019 08/04/2019 08/03/2019  Glucose 70 - 99 mg/dL 130(H) 122(H) 176(H)  BUN 8 - 23 mg/dL 30(H) 29(H) 41(H)  Creatinine 0.61 - 1.24 mg/dL 0.89 0.88 1.12  Sodium 135 - 145 mmol/L 150(H) 149(H) 145  Potassium 3.5 - 5.1 mmol/L 3.5 3.3(L) 3.4(L)  Chloride 98 - 111 mmol/L 111 112(H) 106  CO2 22 - 32 mmol/L 26 27 24   Calcium 8.9 - 10.3 mg/dL 9.1 8.8(L) 9.8  Total Protein 6.5 - 8.1 g/dL - 7.3 9.1(H)  Total Bilirubin 0.3 - 1.2 mg/dL - 0.9 0.8  Alkaline Phos 38 - 126 U/L - 46 64  AST 15 - 41 U/L - 83(H) 98(H)  ALT 0 - 44 U/L - 56(H) 55(H)    Imaging studies: No new pertinent imaging studies   Assessment/Plan:  68 y.o. male with high-grade small bowel obstruction, complicated by pertinent comorbidities including severe  developmental delay and seizure disorder, aspiration pneumonia. Today there has been a major improvement in his abdominal physical exam.  X-ray 8 hours after administration of Gastrografin shows continued small bowel dilation with contrast in the small bowel.  There was improvement in white blood cell count.  There is no fever or tachycardia.  We will continue NGT for tonight and if there is no progress in the next 12 hours we will consider surgical management for non-resolving small bowel obstruction.  Will continue with IV hydration, n.p.o. and optimization for possible surgical management.  Patient currently on antibiotic therapy for aspiration pneumonia.  Appreciate hospitalist management of medical conditions.  Arnold Long, MD

## 2019-08-04 NOTE — Progress Notes (Signed)
NP, Ouma saw the patient after NG tube was placed and told nurse it was ok to administer lamotrigine and tegretol via NG tube yet to hold the rest of the medications. NG tube was clamped for 50 minutes before being placed to LIWS.

## 2019-08-04 NOTE — Progress Notes (Signed)
MD notified: potassium was 3.3 and sodium was 149 this AM. Latic acid was 2.8 yesterday do you want to repeat lab?

## 2019-08-04 NOTE — Progress Notes (Signed)
MD notified: NG tube output has been 1625ml so far. He has started to get a little more aggetated do you want to order anything.

## 2019-08-04 NOTE — Progress Notes (Signed)
[facility] [place] NAME: Richard Gillespie    MR#:  [id number]  DATE OF BIRTH:  May 11, 1951  SUBJECTIVE:  CHIEF COMPLAINT:   Chief Complaint  Patient presents with  . Emesis  Patient seen and evaluated today Has NG tube with suction Developmentally retarded Unable to obtain any history  REVIEW OF SYSTEMS:    ROS  Developmentally retarded unable to obtain any history  DRUG ALLERGIES:   Allergies  Allergen Reactions  . Zithromax [Azithromycin] Other (See Comments)    unknown    VITALS:  Blood pressure 116/83, pulse 86, temperature 98.7 F (37.1 C), temperature source Oral, resp. rate 18, height 5\' 8"  (1.727 m), weight 68 kg, SpO2 96 %.  PHYSICAL EXAMINATION:   Physical Exam  GENERAL:  68 y.o.-year-old patient lying in the bed with NG tube to suction EYES: Pupils equal, round, reactive to light and accommodation. No scleral icterus. Extraocular muscles intact.  HEENT: Head atraumatic, normocephalic. Oropharynx NG tube noted NECK:  Supple, no jugular venous distention. No thyroid enlargement, no tenderness.  LUNGS: Normal breath sounds bilaterally, no wheezing, rales, rhonchi. No use of accessory muscles of respiration.  CARDIOVASCULAR: S1, S2 normal. No murmurs, rubs, or gallops.  ABDOMEN: Soft, nontender, mildly distended. Bowel sounds decreased. No organomegaly or mass.  EXTREMITIES: No cyanosis, clubbing or edema b/l.    NEUROLOGIC: Developmentally retarded Moves all extremities PSYCHIATRIC: Could not be assessed SKIN: No obvious rash, lesion, or ulcer.   LABORATORY PANEL:   CBC Recent Labs  Lab 08/04/19 0436  WBC 8.1  HGB 14.6  HCT 44.4  PLT 198   ------------------------------------------------------------------------------------------------------------------ Chemistries  Recent Labs  Lab 08/04/19 0436  NA 149*  K 3.3*  CL 112*  CO2 27  GLUCOSE 122*  BUN 29*  CREATININE 0.88  CALCIUM 8.8*  AST 83*  ALT  56*  ALKPHOS 46  BILITOT 0.9   ------------------------------------------------------------------------------------------------------------------  Cardiac Enzymes No results for input(s): TROPONINI in the last 168 hours. ------------------------------------------------------------------------------------------------------------------  RADIOLOGY:  Ct Abdomen Pelvis W Contrast  Result Date: 08/03/2019 CLINICAL DATA:  Per staff at group home, patient has been refusing to eat and drink since Saturday. Staff member states patient has had 2 ensures since Saturday. Patient has vomited x 3 in the past 24 hours. Staff reports patient had 1 episode.*comment was truncated*^13mL OMNIPAQUE IOHEXOL 300 MG/ML SOLN EXAM: CT ABDOMEN AND PELVIS WITH CONTRAST TECHNIQUE: Multidetector CT imaging of the abdomen and pelvis was performed using the standard protocol following bolus administration of intravenous contrast. CONTRAST:  16mL OMNIPAQUE IOHEXOL 300 MG/ML  SOLN COMPARISON:  CT of the abdomen and pelvis 09/12/2018 FINDINGS: Lower chest: There is atelectasis or early infiltrate within the RIGHT LOWER lobe. The heart is within the RIGHT side of the chest, warrant angled axis. Hepatobiliary: Liver is unremarkable. Normal appearance of the gallbladder. Pancreas: Unremarkable. No pancreatic ductal dilatation or surrounding inflammatory changes. Spleen: Normal in size without focal abnormality. Adrenals/Urinary Tract: Adrenal glands are normal in appearance. Kidneys are symmetric in size. There are small bilateral low-attenuation lesions within the kidneys, too small fully characterize but stable. The bladder and visualized portion of the urethra are normal. Stomach/Bowel: The stomach is distended and contains an air-fluid level in accounts for the air identified within the [place] on plain film. There is no evidence for perforation. There is marked dilatation of small bowel loops which measure up to 6  centimeters. There is an abrupt transition point from dilated to normal  caliber small bowel in the central abdomen rib there is also a twist of mesenteric vessels, best identified on sagittal image number 50/7. The distal small bowel loops and colon are unremarkable. Vascular/Lymphatic: No significant vascular findings are present. No enlarged abdominal or pelvic lymph nodes. Reproductive: Prostate is unremarkable. Other: There is a small amount of fluid. There is no free intraperitoneal air. No abscess. Musculoskeletal: No acute or significant osseous findings. IMPRESSION: 1. High-grade small bowel obstruction with transition point in the central abdomen. Favor adhesions as a source for obstruction. 2. No evidence for perforation.  No abscess. 3. Small amount of free intraperitoneal fluid. 4. Right lower lobe atelectasis or early infiltrate. Dextrocardia, stable in appearance. Electronically Signed   By: Norva PavlovElizabeth  Brown M.D.   On: 08/03/2019 11:00   Dg Chest Port 1 View  Result Date: 08/03/2019 CLINICAL DATA:  Suspected sepsis.  Vomiting. EXAM: PORTABLE CHEST 1 VIEW COMPARISON:  09/12/2018 FINDINGS: There is a scoliosis deformity. Diffuse pulmonary vascular congestion identified. Asymmetric elevation of left hemidiaphragm, unchanged. There is marked distension of the small bowel loops within the left upper quadrant of the abdomen concerning for small bowel obstruction. IMPRESSION: 1. Pulmonary vascular congestion. 2. Small bowel dilatation concerning for SBO. Electronically Signed   By: Signa Kellaylor  Stroud M.D.   On: 08/03/2019 10:26   Dg Abd 2 Views  Result Date: 08/03/2019 CLINICAL DATA:  Possible small bowel obstruction per ordering notes. Patient unable to give adequate history- Per ED notes- Per staff at group home, patient has been refusing to eat and drink since Saturday. EXAM: ABDOMEN - 2 VIEW COMPARISON:  09/19/2018 FINDINGS: There is marked dilatation of small bowel loops throughout the abdomen and  pelvis, with small bowel loops measuring up to 6 centimeters in diameter. Small amount of air is identified within nondilated loops of colon. There is lucency within the RIGHT UPPER QUADRANT, atypical for bowel structure in raising the question of abscess or loculated air collection. No evidence for free intraperitoneal air beneath the diaphragm. Patchy opacity is identified at the RIGHT lung base. IMPRESSION: 1. High-grade small bowel obstruction. 2. Question of abscess or loculated extraluminal air collection in the RIGHT upper quadrant. 3. RIGHT LOWER lobe atelectasis or infiltrate. These results were called by telephone at the time of interpretation on 08/03/2019 at 10:43 am to Dr. Sharyn CreamerMARK QUALE , who verbally acknowledged these results. Electronically Signed   By: Norva PavlovElizabeth  Brown M.D.   On: 08/03/2019 10:45   Dg Abd Portable 1v  Result Date: 08/03/2019 CLINICAL DATA:  S/p NG tube placement EXAM: PORTABLE ABDOMEN - 1 VIEW COMPARISON:  08/03/2019 FINDINGS: Interval placement of nasogastric tube, tip overlying the expected location of the stomach. Continued marked small bowel dilatation. RIGHT LOWER lobe infiltrate. IMPRESSION: Interval placement of nasogastric tube. Electronically Signed   By: Norva PavlovElizabeth  Brown M.D.   On: 08/03/2019 15:33     ASSESSMENT AND PLAN:   68 year old male patient with history of mental retardation, seizure disorder, bowel obstruction in the past is a resident of group home currently under hospitalist service  -Small bowel obstruction Appreciate surgery follow-up Gastrografin study today Continue NG tube with clamping IV fluids N.p.o. for now  -Hypernatremia Hydration with half-normal saline Follow-up sodium level  -Acute hypokalemia Replace potassium intravenously  -Seizure disorder Continue Keppra and Lamictal  -DVT prophylaxis sequential compression device to lower extremities  -Aspiration pneumonia Currently on broad-spectrum antibiotics Follow-up cultures  and taper antibiotic  All the records are reviewed and case discussed with Care Management/Social  Worker. Management plans discussed with the patient, family and they are in agreement.  CODE STATUS: DNR  DVT Prophylaxis: SCDs  TOTAL TIME TAKING CARE OF THIS PATIENT: 38 minutes.   POSSIBLE D/C IN  DAYS, DEPENDING ON CLINICAL CONDITION.  Ihor Austin M.D on 08/04/2019 at 10:53 AM  Between 7am to 6pm - Pager - [phone]  After 6pm go to [email or web address] - password [email or web address] [facility]  [facility]  Office  [phone]  CC: Primary care physician; Gracelyn Nurse, MD  Note: This dictation was prepared with Dragon dictation along with smaller phrase technology. Any transcriptional errors that result from this process are unintentional.

## 2019-08-04 NOTE — Progress Notes (Signed)
Initial Nutrition Assessment  RD working remotely.  DOCUMENTATION CODES:   Not applicable  INTERVENTION:  Will monitor for diet advancement vs need for nutrition support.  NUTRITION DIAGNOSIS:   Inadequate oral intake related to inability to eat as evidenced by NPO status.  GOAL:   Patient will meet greater than or equal to 90% of their needs  MONITOR:   Diet advancement, Labs, Weight trends, I & O's  REASON FOR ASSESSMENT:   Malnutrition Screening Tool    ASSESSMENT:   68 year old male with PMHx of developmental delay, seizures, hx bowel obstruction admitted from group home with SBO.   Patient unable to provide any history at this time. He is known to our service from previous admissions. He has required TPN in the past due to prolonged NPO status in setting of SBO. Patient remains weight-stable per weight history in chart. He is currently 150 lbs. Will monitor plan of care.  Medications reviewed and include: MVI daily, senna 2 tablets BID, D5 with KCl 20 mEq/L at 125 mL/hr, Keppra, Zosyn.  Labs reviewed: CBG 123, Sodium 150, BUN 30.  NUTRITION - FOCUSED PHYSICAL EXAM:  Unable to complete at this time.  Diet Order:   Diet Order            Diet NPO time specified  Diet effective now             EDUCATION NEEDS:   No education needs have been identified at this time  Skin:  Skin Assessment: Reviewed RN Assessment  Last BM:  Unknown  Height:   Ht Readings from Last 1 Encounters:  08/03/19 5\' 8"  (1.727 m)   Weight:   Wt Readings from Last 1 Encounters:  08/03/19 68 kg   Ideal Body Weight:  70 kg  BMI:  Body mass index is 22.81 kg/m.  Estimated Nutritional Needs:   Kcal:  1725-2000  Protein:  80-95 grams  Fluid:  1.7-2 L/day  Willey Blade, MS, RD, LDN Office: [phone] Pager: [phone] After Hours/Weekend Pager: [phone]

## 2019-08-04 NOTE — TOC Initial Note (Addendum)
Transition of Care [facility](TOC) - Initial/Assessment Note    Patient Details  Name: Richard Gillespie MRN: [id number] Date of Birth: 1951/02/01  Transition of Care [facility](TOC) CM/SW Contact:    Richard Gillespie C Richard Welter, LCSW Phone Number: 08/04/2019, 2:29 PM  Clinical Narrative: Patient only oriented to self. CSW called patient's brother/legal guardian, introduced role, and explained that discharge planning would be discussed. Patient's brother confirmed [facility] and plan is for him to return when stable. Based on chart review, patient has been there 42 years. CSW left voicemail at group home to determine what they need once he is stable for discharge. Patient's brother will be here shortly and is bringing us a copy of his guardianship paperwork. No further concerns. CSW encouraged patient's brother to contact CSW as needed. CSW will continue to follow patient and his brother for support and facilitate return to group home when stable.       2:35 pm: [facility] [facility] [place] [facility] He said they will need an FL2 and discharge summary faxed over once discharged. They will pick him up to bring him back to the facility.            Expected Discharge Plan: Group Home Barriers to Discharge: Continued Medical Work up   Patient Goals and CMS Choice Patient states their goals for this hospitalization and ongoing recovery are:: Patient not fully oriented.   Choice offered to / list presented to : NA  Expected Discharge Plan and Services Expected Discharge Plan: Group Home       Living arrangements for the past 2 months: Group Home                                      Prior Living Arrangements/Services Living arrangements for the past 2 months: Group Home Lives with:: Facility Resident Patient language and need for interpreter reviewed:: Yes Do you feel safe going back to the place where you live?: Yes      Need for Family Participation in Patient Care: Yes  (Comment) Care giver support system in place?: Yes (comment)   Criminal Activity/Legal Involvement Pertinent to Current Situation/Hospitalization: No - Comment as needed  Activities of Daily Living Home Assistive Devices/Equipment: None ADL Screening (condition at time of admission) Patient's cognitive ability adequate to safely complete daily activities?: No Is the patient deaf or have difficulty hearing?: No Does the patient have difficulty seeing, even when wearing glasses/contacts?: No Does the patient have difficulty concentrating, remembering, or making decisions?: Yes Patient able to express need for assistance with ADLs?: No Does the patient have difficulty dressing or bathing?: Yes Independently performs ADLs?: No Communication: Needs assistance Is this a change from baseline?: Pre-admission baseline Dressing (OT): Needs assistance Is this a change from baseline?: Pre-admission baseline Grooming: Needs assistance Is this a change from baseline?: Pre-admission baseline Feeding: Needs assistance Is this a change from baseline?: Pre-admission baseline Bathing: Needs assistance Is this a change from baseline?: Pre-admission baseline Toileting: Needs assistance Is this a change from baseline?: Pre-admission baseline In/Out Bed: Needs assistance Is this a change from baseline?: Change from baseline, expected to last <3 days Walks in Home: Independent Does the patient have difficulty walking or climbing stairs?: No Weakness of Legs: None Weakness of Arms/Hands: None  Permission Sought/Granted Permission sought to share information with : Facility Medical sales representativeContact Representative, Guardian    Share Information with  NAME: Richard Gillespie  Permission granted to share info w AGENCY: [place] granted to share info w Relationship: Brother/Legal Guardian  Permission granted to share info w Contact Information: [phone]  Emotional Assessment Appearance:: Appears  stated age Attitude/Demeanor/Rapport: Unable to Assess Affect (typically observed): Unable to Assess Orientation: : Oriented to Self Alcohol / Substance Use: Never Used Psych Involvement: No (comment)  Admission diagnosis:  Sepsis [facility]) [A41.9] Patient Active Problem List   Diagnosis Date Noted  . Small bowel obstruction ([place]) 08/03/2019  . Malnutrition of moderate degree 09/17/2018  . SBO (small bowel obstruction) ([place]) 09/12/2018  . Aspiration pneumonia ([place]) 09/12/2018  . Seizure disorder ([place]) 09/12/2018  . Sepsis ([place]) 09/12/2018  . E coli infection 01/15/2017  . Agitation 01/15/2017  . Diarrhea 01/15/2017  . Hypokalemia 01/15/2017  . Hyponatremia 01/15/2017  . Leukocytosis 01/15/2017  . Hyperglycemia 01/15/2017  . UTI (urinary tract infection) 01/12/2017   PCP:  Baxter Hire, MD Pharmacy:   [place], [place] - [place] [address] [place] [address] Phone: [phone] Fax: [phone]     Social Determinants of Health (SDOH) Interventions    Readmission Risk Interventions No flowsheet data found.

## 2019-08-04 NOTE — Plan of Care (Addendum)
NG tube output during day shift has been 1600 ml. IV fluids provided with potassium as the patient has a potassium level of 3.3 today. Abdominal images obtained. No falls. Lactic acid levels checked. Medications given through NG tube and clamped for 45. The patient's brother came to see the patient for about 10 minutes. 1mg  of ativan given for agitation.  Problem: Education: Goal: Knowledge of General Education information will improve Description: Including pain rating scale, medication(s)/side effects and non-pharmacologic comfort measures Outcome: Progressing   Problem: Health Behavior/Discharge Planning: Goal: Ability to manage health-related needs will improve Outcome: Progressing   Problem: Clinical Measurements: Goal: Ability to maintain clinical measurements within normal limits will improve Outcome: Progressing Goal: Will remain free from infection Outcome: Progressing Goal: Diagnostic test results will improve Outcome: Progressing Goal: Respiratory complications will improve Outcome: Progressing Goal: Cardiovascular complication will be avoided Outcome: Progressing   Problem: Activity: Goal: Risk for activity intolerance will decrease Outcome: Progressing   Problem: Nutrition: Goal: Adequate nutrition will be maintained Outcome: Progressing   Problem: Coping: Goal: Level of anxiety will decrease Outcome: Progressing   Problem: Elimination: Goal: Will not experience complications related to bowel motility Outcome: Progressing Goal: Will not experience complications related to urinary retention Outcome: Progressing   Problem: Pain Managment: Goal: General experience of comfort will improve Outcome: Progressing   Problem: Safety: Goal: Ability to remain free from injury will improve Outcome: Progressing   Problem: Skin Integrity: Goal: Risk for impaired skin integrity will decrease Outcome: Progressing

## 2019-08-04 NOTE — Progress Notes (Signed)
MD notified: The patient has been tachy today compare to yesterday his heart rate has been sustaining in the 115-120bpm. DO you want to order any PRN BP meds.

## 2019-08-05 ENCOUNTER — Encounter
Admission: EM | Disposition: A | Discharge status: Home / Self Care | Payer: Self-pay | Source: Home / Self Care | Attending: General Surgery

## 2019-08-05 ENCOUNTER — Inpatient Hospital Stay: Payer: Medicare Other

## 2019-08-05 ENCOUNTER — Inpatient Hospital Stay: Payer: Medicare Other | Admitting: Certified Registered"

## 2019-08-05 HISTORY — PX: LAPAROTOMY: SHX154

## 2019-08-05 LAB — BASIC METABOLIC PANEL
Anion gap: 15 (ref 5–15)
BUN: 30 mg/dL — ABNORMAL HIGH (ref 8–23)
CO2: 28 mmol/L (ref 22–32)
Calcium: 9.1 mg/dL (ref 8.9–10.3)
Chloride: 101 mmol/L (ref 98–111)
Creatinine, Ser: 0.93 mg/dL (ref 0.61–1.24)
GFR calc Af Amer: >60 mL/min (ref >60)
GFR calc non Af Amer: >60 mL/min (ref >60)
Glucose, Bld: 160 mg/dL — ABNORMAL HIGH (ref 70–99)
Potassium: 3.6 mmol/L (ref 3.5–5.1)
Sodium: 144 mmol/L (ref 135–145)

## 2019-08-05 LAB — CBC
HCT: 49.6 % (ref 39.0–52.0)
Hemoglobin: 16.3 g/dL (ref 13.0–17.0)
MCH: 29.2 pg (ref 26.0–34.0)
MCHC: 32.9 g/dL (ref 30.0–36.0)
MCV: 88.9 fL (ref 80.0–100.0)
Platelets: 200 10*3/uL (ref 150–400)
RBC: 5.58 MIL/uL (ref 4.22–5.81)
RDW: 12.4 % (ref 11.5–15.5)
WBC: 9.7 10*3/uL (ref 4.0–10.5)
nRBC: 0 % (ref 0.0–0.2)

## 2019-08-05 LAB — CARBAMAZEPINE LEVEL, TOTAL: Carbamazepine Lvl: 10.4 ug/mL (ref 4.0–12.0)

## 2019-08-05 SURGERY — LAPAROTOMY, EXPLORATORY
Anesthesia: General | Site: Abdomen

## 2019-08-05 MED ORDER — BUPIVACAINE-EPINEPHRINE (PF) 0.5% -1:200000 IJ SOLN
INTRAMUSCULAR | Status: AC
  Filled 2019-08-05: qty 30

## 2019-08-05 MED ORDER — PROPOFOL 10 MG/ML IV BOLUS
INTRAVENOUS | Status: DC | PRN
  Administered 2019-08-05: 100 mg via INTRAVENOUS

## 2019-08-05 MED ORDER — KCL IN DEXTROSE-NACL 20-5-0.9 MEQ/L-%-% IV SOLN
INTRAVENOUS | Status: DC
  Administered 2019-08-05 – 2019-08-07 (×5): via INTRAVENOUS
  Filled 2019-08-05 (×9): qty 1000

## 2019-08-05 MED ORDER — LIDOCAINE HCL (CARDIAC) PF 100 MG/5ML IV SOSY
PREFILLED_SYRINGE | INTRAVENOUS | Status: DC | PRN
  Administered 2019-08-05: 60 mg via INTRAVENOUS

## 2019-08-05 MED ORDER — ONDANSETRON HCL 4 MG/2ML IJ SOLN
INTRAMUSCULAR | Status: AC
  Filled 2019-08-05: qty 2

## 2019-08-05 MED ORDER — LORAZEPAM 2 MG/ML IJ SOLN: 0.5000 mg | Freq: Four times a day (QID) | INTRAMUSCULAR | Status: DC | PRN

## 2019-08-05 MED ORDER — DEXAMETHASONE SODIUM PHOSPHATE 10 MG/ML IJ SOLN
INTRAMUSCULAR | Status: AC
  Filled 2019-08-05: qty 1

## 2019-08-05 MED ORDER — ONDANSETRON HCL 4 MG/2ML IJ SOLN
INTRAMUSCULAR | Status: DC | PRN
  Administered 2019-08-05: 4 mg via INTRAVENOUS

## 2019-08-05 MED ORDER — CARBAMAZEPINE 100 MG PO CHEW
200.0000 mg | CHEWABLE_TABLET | Freq: Four times a day (QID) | ORAL | Status: DC
  Administered 2019-08-05 – 2019-08-17 (×46): 200 mg via ORAL
  Filled 2019-08-05 (×55): qty 2

## 2019-08-05 MED ORDER — PIPERACILLIN-TAZOBACTAM 3.375 G IVPB
INTRAVENOUS | Status: AC
  Filled 2019-08-05: qty 50

## 2019-08-05 MED ORDER — HALOPERIDOL LACTATE 5 MG/ML IJ SOLN
5.0000 mg | Freq: Once | INTRAMUSCULAR | Status: AC
  Administered 2019-08-05: 05:00:00 5 mg via INTRAVENOUS
  Filled 2019-08-05: qty 1

## 2019-08-05 MED ORDER — SUGAMMADEX SODIUM 200 MG/2ML IV SOLN
INTRAVENOUS | Status: AC
  Filled 2019-08-05: qty 2

## 2019-08-05 MED ORDER — DEXAMETHASONE SODIUM PHOSPHATE 10 MG/ML IJ SOLN
INTRAMUSCULAR | Status: DC | PRN
  Administered 2019-08-05: 10 mg via INTRAVENOUS

## 2019-08-05 MED ORDER — KETOROLAC TROMETHAMINE 30 MG/ML IJ SOLN
30.0000 mg | Freq: Three times a day (TID) | INTRAMUSCULAR | Status: DC
  Administered 2019-08-06 (×2): 30 mg via INTRAVENOUS
  Filled 2019-08-05 (×3): qty 1

## 2019-08-05 MED ORDER — PROPOFOL 10 MG/ML IV BOLUS
INTRAVENOUS | Status: AC
  Filled 2019-08-05: qty 20

## 2019-08-05 MED ORDER — ONDANSETRON HCL 4 MG/2ML IJ SOLN: 4.0000 mg | Freq: Once | INTRAMUSCULAR | Status: DC | PRN

## 2019-08-05 MED ORDER — ROCURONIUM BROMIDE 100 MG/10ML IV SOLN
INTRAVENOUS | Status: DC | PRN
  Administered 2019-08-05: 50 mg via INTRAVENOUS

## 2019-08-05 MED ORDER — FENTANYL CITRATE (PF) 100 MCG/2ML IJ SOLN
INTRAMUSCULAR | Status: AC
  Filled 2019-08-05: qty 2

## 2019-08-05 MED ORDER — PHENYLEPHRINE HCL (PRESSORS) 10 MG/ML IV SOLN
INTRAVENOUS | Status: DC | PRN
  Administered 2019-08-05 (×3): 100 ug via INTRAVENOUS

## 2019-08-05 MED ORDER — MIDAZOLAM HCL 2 MG/2ML IJ SOLN
INTRAMUSCULAR | Status: AC
  Filled 2019-08-05: qty 2

## 2019-08-05 MED ORDER — SUGAMMADEX SODIUM 200 MG/2ML IV SOLN
INTRAVENOUS | Status: DC | PRN
  Administered 2019-08-05: 140 mg via INTRAVENOUS

## 2019-08-05 MED ORDER — BUPIVACAINE LIPOSOME 1.3 % IJ SUSP
INTRAMUSCULAR | Status: DC | PRN
  Administered 2019-08-05: 20 mL

## 2019-08-05 MED ORDER — MORPHINE SULFATE (PF) 4 MG/ML IV SOLN
4.0000 mg | INTRAVENOUS | Status: DC | PRN
  Administered 2019-08-06: 4 mg via INTRAVENOUS
  Filled 2019-08-05 (×2): qty 1

## 2019-08-05 MED ORDER — HALOPERIDOL LACTATE 5 MG/ML IJ SOLN
4.0000 mg | Freq: Four times a day (QID) | INTRAMUSCULAR | Status: DC | PRN
  Administered 2019-08-05 – 2019-08-09 (×3): 4 mg via INTRAVENOUS
  Filled 2019-08-05 (×3): qty 1

## 2019-08-05 MED ORDER — BUPIVACAINE LIPOSOME 1.3 % IJ SUSP
INTRAMUSCULAR | Status: AC
  Filled 2019-08-05: qty 20

## 2019-08-05 MED ORDER — LACTATED RINGERS IV SOLN
INTRAVENOUS | Status: DC | PRN
  Administered 2019-08-05: 21:00:00 via INTRAVENOUS

## 2019-08-05 MED ORDER — ROCURONIUM BROMIDE 50 MG/5ML IV SOLN
INTRAVENOUS | Status: AC
  Filled 2019-08-05: qty 1

## 2019-08-05 MED ORDER — MIDAZOLAM HCL 2 MG/2ML IJ SOLN
INTRAMUSCULAR | Status: DC | PRN
  Administered 2019-08-05: 2 mg via INTRAVENOUS

## 2019-08-05 MED ORDER — FENTANYL CITRATE (PF) 100 MCG/2ML IJ SOLN: 25.0000 ug | INTRAMUSCULAR | Status: DC | PRN

## 2019-08-05 MED ORDER — BUPIVACAINE-EPINEPHRINE 0.5% -1:200000 IJ SOLN
INTRAMUSCULAR | Status: DC | PRN
  Administered 2019-08-05: 30 mL

## 2019-08-05 MED ORDER — LABETALOL HCL 5 MG/ML IV SOLN
INTRAVENOUS | Status: AC
  Filled 2019-08-05: qty 4

## 2019-08-05 MED ORDER — FENTANYL CITRATE (PF) 100 MCG/2ML IJ SOLN
INTRAMUSCULAR | Status: DC | PRN
  Administered 2019-08-05 (×4): 50 ug via INTRAVENOUS

## 2019-08-05 MED ORDER — HALOPERIDOL LACTATE 5 MG/ML IJ SOLN: 5.0000 mg | Freq: Four times a day (QID) | INTRAMUSCULAR | Status: DC | PRN

## 2019-08-05 SURGICAL SUPPLY — 34 items
CANISTER SUCT 1200ML W/VALVE (MISCELLANEOUS) ×3 IMPLANT
CHLORAPREP W/TINT 26 (MISCELLANEOUS) ×3 IMPLANT
COVER WAND RF STERILE (DRAPES) IMPLANT
DRAPE LAPAROTOMY 100X77 ABD (DRAPES) ×3 IMPLANT
DRSG OPSITE POSTOP 4X10 (GAUZE/BANDAGES/DRESSINGS) IMPLANT
DRSG OPSITE POSTOP 4X8 (GAUZE/BANDAGES/DRESSINGS) IMPLANT
DRSG TEGADERM 4X10 (GAUZE/BANDAGES/DRESSINGS) IMPLANT
DRSG TELFA 3X8 NADH (GAUZE/BANDAGES/DRESSINGS) IMPLANT
ELECT REM PT RETURN 9FT ADLT (ELECTROSURGICAL) ×3
ELECTRODE REM PT RTRN 9FT ADLT (ELECTROSURGICAL) ×1 IMPLANT
GLOVE BIO SURGEON STRL SZ 6.5 (GLOVE) ×4 IMPLANT
GLOVE BIO SURGEONS STRL SZ 6.5 (GLOVE) ×2
GLOVE BIOGEL PI IND STRL 6.5 (GLOVE) ×3 IMPLANT
GLOVE BIOGEL PI INDICATOR 6.5 (GLOVE) ×6
GOWN STRL REUS W/ TWL LRG LVL3 (GOWN DISPOSABLE) ×2 IMPLANT
GOWN STRL REUS W/TWL LRG LVL3 (GOWN DISPOSABLE) ×4
KIT TURNOVER KIT A (KITS) ×3 IMPLANT
LABEL OR SOLS (LABEL) ×3 IMPLANT
LIGASURE IMPACT 36 18CM CVD LR (INSTRUMENTS) ×3 IMPLANT
NDL SAFETY 22GX1.5 (NEEDLE) ×3 IMPLANT
NS IRRIG 1000ML POUR BTL (IV SOLUTION) IMPLANT
PACK BASIN MAJOR ARMC (MISCELLANEOUS) ×3 IMPLANT
PACK COLON CLEAN CLOSURE (MISCELLANEOUS) IMPLANT
SPONGE LAP 18X18 RF (DISPOSABLE) ×3 IMPLANT
STAPLER SKIN PROX 35W (STAPLE) ×3 IMPLANT
SUT PDS AB 1 TP1 54 (SUTURE) ×6 IMPLANT
SUT SILK 2 0 (SUTURE) ×2
SUT SILK 2-0 18XBRD TIE 12 (SUTURE) ×1 IMPLANT
SUT SILK 3 0 (SUTURE) ×2
SUT SILK 3-0 18XBRD TIE 12 (SUTURE) ×1 IMPLANT
SUT VIC AB 3-0 SH 27 (SUTURE) ×4
SUT VIC AB 3-0 SH 27X BRD (SUTURE) ×2 IMPLANT
SYR 20ML LL LF (SYRINGE) ×6 IMPLANT
TRAY FOLEY MTR SLVR 16FR STAT (SET/KITS/TRAYS/PACK) ×3 IMPLANT

## 2019-08-05 NOTE — Op Note (Signed)
Preoperative diagnosis: Small bowel obstruction.  Postoperative diagnosis: Small bowel obstruction.             Small bowel laceration   Procedure: Exploratory laparotomy and extensive enterolysis.                      Enterorrhaphy   Anesthesia: GETA  Surgeon: Dr. Windell Moment, MD  Wound Classification: Clean   Indications: Patient is a 68 y.o. male with history of multiple previous abdominal procedures, who presented 3 days ago with picture consistent with high grade partial small bowel obstruction that progressed to complete bowel obstruction unresponsive to conservative measures.   Findings: 1. Multiple adhesions from omentum to peritoneum 2. Multiple intestinal adhesions.  3. Clear transition point on mid ileum 4. Viable small bowel 5. A liter of bilious content drained 6. Adequate hemostasis  Description of procedure: The patient was placed in the supine position and general endotracheal anesthesia was induced. Preoperative antibiotics were given. The abdomen was prepped and draped in the usual sterile fashion. A timeout was completed verifying correct patient, procedure, site, positioning, and implant(s) and/or special equipment prior to beginning this procedure.  A vertical midline incision was made from xiphoid to just below the umbilicus. This was deepened through the subcutaneous tissues and hemostasis was achieved with electrocautery. The linea alba was identified and incised and the peritoneal cavity entered with care.  Upon entering the abdominal cavity, there are extensive adhesions of omentum and bowel more prominent in left lower quadrant. Omentum and loops of bowel were carefully dissected from the underside of the abdominal wall using sharp dissection with Metzenbaum scissors. Careful exploration of the abdomen was then performed.  The small bowel was run from the ligament of Treitz to the ileocecal valve. The bowel was noted to be dilated proximally and collapsed  distally with a zone of transition at mid ileum.  Multiple adhesions were noted to be obstructing the distal small bowel. All adhesions were divided sharply and all small bowel loops carefully separated. The entire small bowel was then inspected for viability and the absence of any additional obstructing bands. A small laceration was noted from the lysis of adhesion and the distention of the small bowel. The laceration was repaired with multiple Lembert sutures. Small bowel content was milked proximally to be aspirated by the nasogastric tube and distally into the colon, again confirming patency and integrity throughout its entire length. The abdominal cavity was copiously washed with saline and hemostasis was obtained. Multiple omentum bands were divided with LigaSure to decrease the chances of future bowel obstruction.  The fascia was closed with a running suture of PDS 1.  The skin was closed with skin staples.  The patient tolerated the procedure well and was taken to the postanesthesia care unit in stable condition.   Specimen: None  Complications: None  Estimated Blood Loss: 25 mL

## 2019-08-05 NOTE — Progress Notes (Signed)
Patient pulled out his NG tube. AN order was given to given haldol 5 mg prior to procedure. He had been agitated and combative when the first one was placed. Haldol was given and the NG tube was reinserted to 60 cm. An abdominal xray was ordered to confirm placement. Dr Marcille Blanco read the results and said it was located correctly and OK to use it. Patient was hooked up again to LIWS.

## 2019-08-05 NOTE — Anesthesia Procedure Notes (Signed)
Procedure Name: Intubation Date/Time: 08/05/2019 9:21 PM Performed by: Jonna Clark, CRNA Pre-anesthesia Checklist: Patient identified, Patient being monitored, Timeout performed, Emergency Drugs available and Suction available Patient Re-evaluated:Patient Re-evaluated prior to induction Oxygen Delivery Method: Circle system utilized Preoxygenation: Pre-oxygenation with 100% oxygen Induction Type: IV induction Ventilation: Mask ventilation without difficulty Laryngoscope Size: Mac and 3 Grade View: Grade I Tube type: Oral Number of attempts: 1 Placement Confirmation: ETT inserted through vocal cords under direct vision,  positive ETCO2 and breath sounds checked- equal and bilateral Secured at: 21 cm Tube secured with: Tape Dental Injury: Teeth and Oropharynx as per pre-operative assessment

## 2019-08-05 NOTE — Progress Notes (Signed)
[facility] Day(s): 2.   Post op day(s): Day of Surgery.   Interval History: Patient seen and examined, no acute events or new complaints overnight.  Nurse denies any issues.  Denies any bowel movement.  Patient unable to give history due to developmental delay.  He had a 2.8 L output from the nasogastric tube.  Content looks fecal material.  New abdominal x-ray shows no contrast passing the obstruction.  Vital signs in last 24 hours: [min-max] current  Temp:  [97.9 F (36.6 C)-98.5 F (36.9 C)] 97.9 F (36.6 C) (09/09 0445) Pulse Rate:  [80-113] 80 (09/09 0445) Resp:  [18] 18 (09/09 0445) BP: (93-132)/(70-81) 93/74 (09/09 0445) SpO2:  [93 %-98 %] 98 % (09/09 0445)     Height: 5\' 8"  (172.7 cm) Weight: 68 kg BMI (Calculated): 22.81   NGT: 2.8 L (fecal-like drainage)  Physical Exam:  Constitutional: cooperative and no distress  Respiratory: breathing non-labored at rest  Cardiovascular: regular rate and sinus rhythm  Gastrointestinal: soft, mild-tender, and distended  Labs:  CBC Latest Ref Rng & Units 08/05/2019 08/04/2019 08/03/2019  WBC 4.0 - 10.5 K/uL 9.7 8.1 14.4(H)  Hemoglobin 13.0 - 17.0 g/dL 16.3 14.6 16.0  Hematocrit 39.0 - 52.0 % 49.6 44.4 48.1  Platelets 150 - 400 K/uL 200 198 257   CMP Latest Ref Rng & Units 08/05/2019 08/04/2019 08/04/2019  Glucose 70 - 99 mg/dL 160(H) 178(H) 130(H)  BUN 8 - 23 mg/dL 30(H) 32(H) 30(H)  Creatinine 0.61 - 1.24 mg/dL 0.93 0.94 0.89  Sodium 135 - 145 mmol/L 144 149(H) 150(H)  Potassium 3.5 - 5.1 mmol/L 3.6 3.3(L) 3.5  Chloride 98 - 111 mmol/L 101 107 111  CO2 22 - 32 mmol/L 28 28 26   Calcium 8.9 - 10.3 mg/dL 9.1 9.1 9.1  Total Protein 6.5 - 8.1 g/dL - - -  Total Bilirubin 0.3 - 1.2 mg/dL - - -  Alkaline Phos 38 - 126 U/L - - -  AST 15 - 41 U/L - - -  ALT 0 - 44 U/L - - -    Imaging studies: Personally evaluated the abdominal x-ray.  There is persistent small bowel dilation.  I do see any contrast in the colon after  24 hours from administration.   Assessment/Plan:  68 y.o.malewith high-grade small bowel obstruction, complicated by pertinent comorbidities includingsevere developmental delay and seizure disorder, aspiration pneumonia. Patient with persistent small bowel dilation despite Gastrografin contrast administration.  24-hour after the administration he still with persistent small bowel dilation.  There is no contrast in the colon.  Due to the high amount of NGT output I consider that the benefits of surgery outweighs the risk.  I discussed the case with his brother Darey Hershberger.  I told him about the small bowel obstruction and that is not resolving despite conservative management with nasogastric tube decompression, bowel rest, IV fluids and management of electrolytes.  Patient also been treated for the aspiration pneumonia and has no distress and no shortness of breath.  Adequate O2 saturation.  I discussed with the brother the risk of surgery that includes bowel perforation, infection, bleeding, persistent obstruction, need of second surgeries, risk of anesthesia including cardiac complication, pulmonary complication, DVT, stroke, among others.  Brother agreed to proceed with surgery.  Arnold Long, MD

## 2019-08-05 NOTE — Progress Notes (Signed)
[place] at [place] NAME: Richard Gillespie    MR#:  [id number]  DATE OF BIRTH:  May 21, 1951  SUBJECTIVE:  CHIEF COMPLAINT:   Chief Complaint  Patient presents with  . Emesis  Patient seen and evaluated today Has NG tube with suction Developmentally retarded Unable to obtain any history Awake and lying in the bed  REVIEW OF SYSTEMS:    ROS  Developmentally retarded unable to obtain any history  DRUG ALLERGIES:   Allergies  Allergen Reactions  . Zithromax [Azithromycin] Other (See Comments)    unknown    VITALS:  Blood pressure 93/74, pulse 80, temperature 97.9 F (36.6 C), temperature source Oral, resp. rate 18, height 5\' 8"  (1.727 m), weight 68 kg, SpO2 98 %.  PHYSICAL EXAMINATION:   Physical Exam  GENERAL:  68 y.o.-year-old patient lying in the bed with NG tube to suction EYES: Pupils equal, round, reactive to light and accommodation. No scleral icterus. Extraocular muscles intact.  HEENT: Head atraumatic, normocephalic. Oropharynx NG tube noted NECK:  Supple, no jugular venous distention. No thyroid enlargement, no tenderness.  LUNGS: Normal breath sounds bilaterally, no wheezing, rales, rhonchi. No use of accessory muscles of respiration.  CARDIOVASCULAR: S1, S2 normal. No murmurs, rubs, or gallops.  ABDOMEN: Soft, nontender, mildly distended. Bowel sounds decreased. No organomegaly or mass.  EXTREMITIES: No cyanosis, clubbing or edema b/l.    NEUROLOGIC: Developmentally retarded Moves all extremities PSYCHIATRIC: Could not be assessed SKIN: No obvious rash, lesion, or ulcer.   LABORATORY PANEL:   CBC Recent Labs  Lab 08/05/19 0635  WBC 9.7  HGB 16.3  HCT 49.6  PLT 200   ------------------------------------------------------------------------------------------------------------------ Chemistries  Recent Labs  Lab 08/04/19 0436  08/05/19 0635  NA 149*   < > 144  K 3.3*   < > 3.6  CL 112*   < > 101  CO2 27    < > 28  GLUCOSE 122*   < > 160*  BUN 29*   < > 30*  CREATININE 0.88   < > 0.93  CALCIUM 8.8*   < > 9.1  AST 83*  --   --   ALT 56*  --   --   ALKPHOS 46  --   --   BILITOT 0.9  --   --    < > = values in this interval not displayed.   ------------------------------------------------------------------------------------------------------------------  Cardiac Enzymes No results for input(s): TROPONINI in the last 168 hours. ------------------------------------------------------------------------------------------------------------------  RADIOLOGY:  Dg Abd 1 View  Result Date: 08/05/2019 CLINICAL DATA:  Nasogastric tube placement EXAM: ABDOMEN - 1 VIEW COMPARISON:  Yesterday FINDINGS: Nasogastric tube with tip in the gastric fundus. The stomach and left upper quadrant small bowel is dilated. Volume loss and opacification at the right base also seen on prior CT. IMPRESSION: 1. Nasogastric tube with tip in the proximal stomach, which is distended. 2. Ongoing small bowel obstruction. Electronically Signed   By: Monte Fantasia M.D.   On: 08/05/2019 05:45   Dg Abd Portable 1v-small Bowel Obstruction Protocol-24 Hr Delay  Result Date: 08/05/2019 CLINICAL DATA:  Follow up small bowel obstruction EXAM: PORTABLE ABDOMEN - 1 VIEW COMPARISON:  08/04/2019 FINDINGS: The previously administered contrast has passed through the small bowel into the colon. Persistent small bowel dilatation is again seen when compared with the prior study. This is predominately within the left abdomen. No bony abnormality is noted. No free air is seen. IMPRESSION: Previously administered contrast has passed  distally. Persistent small bowel dilatation in the left abdomen is noted however. Electronically Signed   By: Alcide CleverMark  Lukens M.D.   On: 08/05/2019 10:32   Dg Abd Portable 1v-small Bowel Obstruction Protocol-initial, 8 Hr Delay  Result Date: 08/04/2019 CLINICAL DATA:  Small-bowel obstruction 8 hour delay EXAM: PORTABLE ABDOMEN  - 1 VIEW COMPARISON:  08/03/2019 FINDINGS: Multiple loops of dilated small bowel persist, measuring up to 5.2 cm. Dilute contrast within dilated small bowel but no contrast in the colon. Residual contrast in the urinary bladder. Partially visualized esophageal tube tip in the upper abdomen. IMPRESSION: Dilute contrast within markedly dilated small bowel, consistent with high-grade small bowel obstruction. No definitive contrast within the colon. Pelvic contrast is felt to represent contrast within the urinary bladder. Electronically Signed   By: Jasmine PangKim  Fujinaga M.D.   On: 08/04/2019 19:53   Dg Abd Portable 1v  Result Date: 08/03/2019 CLINICAL DATA:  S/p NG tube placement EXAM: PORTABLE ABDOMEN - 1 VIEW COMPARISON:  08/03/2019 FINDINGS: Interval placement of nasogastric tube, tip overlying the expected location of the stomach. Continued marked small bowel dilatation. RIGHT LOWER lobe infiltrate. IMPRESSION: Interval placement of nasogastric tube. Electronically Signed   By: Norva PavlovElizabeth  Brown M.D.   On: 08/03/2019 15:33     ASSESSMENT AND PLAN:   68 year old male patient with history of mental retardation, seizure disorder, bowel obstruction in the past is a resident of group home currently under hospitalist service  -Small bowel obstruction Repeat ABD xray reviewed  surgery follow-up Continue NG tube with clamping IV fluids N.p.o. for now  -Hypernatremia improved Switch IV fluids to D5 normal saline with potassium supplementation Follow-up sodium level  -Acute hypokalemia Improved  -Seizure disorder Continue Keppra and Lamictal via NGT  -DVT prophylaxis sequential compression device to lower extremities  -Aspiration pneumonia Zosyn antibiotic on board MRSA PCR negative vancomycin discontinued Follow-up cultures and taper antibiotic  All the records are reviewed and case discussed with Care Management/Social Worker. Management plans discussed with the patient, family and they are in  agreement.  CODE STATUS: DNR  DVT Prophylaxis: SCDs  TOTAL TIME TAKING CARE OF THIS PATIENT: 35 minutes.   POSSIBLE D/C IN  DAYS, DEPENDING ON CLINICAL CONDITION.  Ihor AustinPavan  M.D on 08/05/2019 at 10:48 AM  Between 7am to 6pm - Pager - [phone]  After 6pm go to [email or web address] - password [email or web address] [facility]  [facility]  Office  [phone]  CC: Primary care physician; Gracelyn NurseJohnston, John D, MD  Note: This dictation was prepared with Dragon dictation along with smaller phrase technology. Any transcriptional errors that result from this process are unintentional.

## 2019-08-05 NOTE — Progress Notes (Signed)
Patient had a condom cath in place that had less than 150 ml in bag for entire 12 hour shift. I did a bladder scan on patient and it showed > 539 ml of urine in the patient's bladder. I asked Dr Marcille Blanco to order an indwelling urinary catheter to assist patient in emptying his bladder. Since the patient has a history of mental retardation I felt this was appropriate instead of traumatizing the patient with one and possible multiple in and out caths. The patient tolerated the procedure well and 350 ml of urine was returned at time of insertion.

## 2019-08-05 NOTE — Anesthesia Post-op Follow-up Note (Signed)
Anesthesia QCDR form completed.        

## 2019-08-05 NOTE — Progress Notes (Signed)
PT Cancellation Note  Patient Details Name: Richard Gillespie MRN: [id number] DOB: [birth year]   Cancelled Treatment:    Reason Eval/Treat Not Completed: Patient not medically ready(Consult received and chart reviewed.  Per discussion with primary RN, patient generally lethargic (haldol last PM) and possible surgical intervention (ex-lap) this PM.  Will hold this date and re-attempt next date as medically appropriate.    Of note, if patient undergoes procedure under general anesthesia, will require new orders to continue PT services post-op)   Simrat Kendrick H. Owens Shark, PT, DPT, NCS 08/05/19, 12:05 PM [phone]

## 2019-08-05 NOTE — Transfer of Care (Signed)
Immediate Anesthesia Transfer of Care Note  Patient: Richard Gillespie  Procedure(s) Performed: EXPLORATORY LAPAROTOMY (N/A Abdomen)  Patient Location: PACU  Anesthesia Type:General  Level of Consciousness: drowsy and patient cooperative  Airway & Oxygen Therapy: Patient Spontanous Breathing and Patient connected to face mask oxygen  Post-op Assessment: Report given to RN and Post -op Vital signs reviewed and stable  Post vital signs: Reviewed and stable  Last Vitals:  Vitals Value Taken Time  BP 162/72 08/05/19 2301  Temp 36.5 C 08/05/19 2301  Pulse 58 08/05/19 2303  Resp 24 08/05/19 2303  SpO2 100 % 08/05/19 2303  Vitals shown include unvalidated device data.  Last Pain:  Vitals:   08/05/19 1219  TempSrc: Axillary         Complications: No apparent anesthesia complications

## 2019-08-05 NOTE — Progress Notes (Signed)
D/w nursing staff placement of indwelling Foley for brief period of time due to patient's inability to understanding need for in and out cath due to developmental delay/intellectual limitations. Feel as though repeated caths will be emotionally if not physically traumatizing. Will order indwelling catheter

## 2019-08-05 NOTE — Anesthesia Preprocedure Evaluation (Addendum)
Anesthesia Evaluation  Patient identified by MRN, date of birth, ID band Patient awake and Patient confused    Reviewed: Allergy & Precautions, H&P , NPO status , Patient's Chart, lab work & pertinent test results  History of Anesthesia Complications Negative for: history of anesthetic complications  Airway Mallampati: III  TM Distance: >3 FB Neck ROM: limited    Dental  (+) Poor Dentition, Missing   Pulmonary pneumonia, neg recent URI,           Cardiovascular Exercise Tolerance: Good (-) angina(-) Past MI negative cardio ROS  (-) dysrhythmias      Neuro/Psych Seizures -,  PSYCHIATRIC DISORDERS    GI/Hepatic negative GI ROS, Neg liver ROS, neg GERD  ,  Endo/Other  negative endocrine ROS  Renal/GU      Musculoskeletal   Abdominal   Peds  Hematology negative hematology ROS (+)   Anesthesia Other Findings SBO  Past Medical History: No date: Mental retardation No date: Seizures ([place])  Past Surgical History: No date: COLON SURGERY  BMI    Body Mass Index: 22.81 kg/m      Reproductive/Obstetrics negative OB ROS                            Anesthesia Physical Anesthesia Plan  ASA: IV  Anesthesia Plan: General ETT, Rapid Sequence and Cricoid Pressure   Post-op Pain Management:    Induction: Intravenous  PONV Risk Score and Plan: Ondansetron, Dexamethasone, Midazolam and Treatment may vary due to age or medical condition  Airway Management Planned: Oral ETT  Additional Equipment:   Intra-op Plan:   Post-operative Plan: Extubation in OR  Informed Consent: I have reviewed the patients History and Physical, chart, labs and discussed the procedure including the risks, benefits and alternatives for the proposed anesthesia with the patient or authorized representative who has indicated his/her understanding and acceptance.   Patient has DNR.  Discussed DNR with power of  attorney and Suspend DNR.   Dental Advisory Given  Plan Discussed with: Anesthesiologist, CRNA and Surgeon  Anesthesia Plan Comments: (Phone consent from brother Adriel Kessen at [phone]   Brother consented for risks of anesthesia including but not limited to:  - adverse reactions to medications - damage to teeth, lips or other oral mucosa - sore throat or hoarseness - Damage to heart, brain, lungs or loss of life  He voiced understanding.)       Anesthesia Quick Evaluation

## 2019-08-06 ENCOUNTER — Encounter: Payer: Self-pay | Admitting: General Surgery

## 2019-08-06 LAB — BASIC METABOLIC PANEL
Anion gap: 13 (ref 5–15)
BUN: 33 mg/dL — ABNORMAL HIGH (ref 8–23)
CO2: 28 mmol/L (ref 22–32)
Calcium: 8.3 mg/dL — ABNORMAL LOW (ref 8.9–10.3)
Chloride: 104 mmol/L (ref 98–111)
Creatinine, Ser: 0.97 mg/dL (ref 0.61–1.24)
GFR calc Af Amer: >60 mL/min (ref >60)
GFR calc non Af Amer: >60 mL/min (ref >60)
Glucose, Bld: 207 mg/dL — ABNORMAL HIGH (ref 70–99)
Potassium: 3.4 mmol/L — ABNORMAL LOW (ref 3.5–5.1)
Sodium: 145 mmol/L (ref 135–145)

## 2019-08-06 LAB — CARBAMAZEPINE LEVEL, TOTAL: Carbamazepine Lvl: 5 ug/mL (ref 4.0–12.0)

## 2019-08-06 MED ORDER — KETOROLAC TROMETHAMINE 30 MG/ML IJ SOLN
15.0000 mg | Freq: Three times a day (TID) | INTRAMUSCULAR | Status: AC
  Administered 2019-08-06 – 2019-08-07 (×4): 15 mg via INTRAVENOUS
  Filled 2019-08-06 (×4): qty 1

## 2019-08-06 MED ORDER — ENOXAPARIN SODIUM 40 MG/0.4ML ~~LOC~~ SOLN
40.0000 mg | SUBCUTANEOUS | Status: DC
  Administered 2019-08-06 – 2019-08-16 (×11): 40 mg via SUBCUTANEOUS
  Filled 2019-08-06 (×11): qty 0.4

## 2019-08-06 NOTE — Anesthesia Postprocedure Evaluation (Signed)
Anesthesia Post Note  Patient: Richard Gillespie  Procedure(s) Performed: EXPLORATORY LAPAROTOMY (N/A Abdomen)  Patient location during evaluation: PACU Anesthesia Type: General Level of consciousness: awake and alert Pain management: pain level controlled Vital Signs Assessment: post-procedure vital signs reviewed and stable Respiratory status: spontaneous breathing, nonlabored ventilation, respiratory function stable and patient connected to nasal cannula oxygen Cardiovascular status: blood pressure returned to baseline and stable Postop Assessment: no apparent nausea or vomiting Anesthetic complications: no     Last Vitals:  Vitals:   08/06/19 0227 08/06/19 0430  BP: 140/75 (!) 144/79  Pulse: 78 98  Resp: 17 18  Temp: 36.7 C 36.7 C  SpO2: 95% 95%    Last Pain:  Vitals:   08/06/19 0430  TempSrc: Oral  PainSc:                  Martha Clan

## 2019-08-06 NOTE — Care Management Important Message (Signed)
Important Message  Patient Details  Name: EDUAR KUMPF MRN: [id number] Date of Birth: [birth year]   Medicare Important Message Given:  Yes  Verbally reviewed with brother, Deunte Bledsoe, at [phone].  Aware of right. Requested copy be sent via email at [email or web address].  Copy of Medicare IM sent securely to email address provided after confirming address correct.  Dannette Barbara 08/06/2019, 12:47 PM

## 2019-08-06 NOTE — Progress Notes (Signed)
Spoke to patient's brother Rhyder Koegel and gave him an update on patient. Patient lying in bed quietly with saftely sitter at bedside. Will continue to monitor.

## 2019-08-06 NOTE — Progress Notes (Signed)
[facility] Day(s): 3.   Post op day(s): 1 Day Post-Op.   Interval History: Patient seen and examined, no acute events or new complaints overnight.  Nurses denies any issues.  Denies any evidence of bowel movement.  Vital signs in last 24 hours: [min-max] current  Temp:  [97.5 F (36.4 C)-98.5 F (36.9 C)] 98.5 F (36.9 C) (09/10 0800) Pulse Rate:  [57-101] 101 (09/10 0800) Resp:  [17-22] 20 (09/10 0800) BP: (111-168)/(68-86) 134/76 (09/10 0800) SpO2:  [94 %-100 %] 96 % (09/10 0800)     Height: 5\' 8"  (172.7 cm) Weight: 68 kg BMI (Calculated): 22.81   NGT: 900 mL during the day, no output after surgery.  Physical Exam:  Constitutional: alert, cooperative and no distress  Respiratory: breathing non-labored at rest  Cardiovascular: regular rate and sinus rhythm  Gastrointestinal: soft, mild-tender, and distended.  Labs:  CBC Latest Ref Rng & Units 08/05/2019 08/04/2019 08/03/2019  WBC 4.0 - 10.5 K/uL 9.7 8.1 14.4(H)  Hemoglobin 13.0 - 17.0 g/dL 16.3 14.6 16.0  Hematocrit 39.0 - 52.0 % 49.6 44.4 48.1  Platelets 150 - 400 K/uL 200 198 257   CMP Latest Ref Rng & Units 08/06/2019 08/05/2019 08/04/2019  Glucose 70 - 99 mg/dL 207(H) 160(H) 178(H)  BUN 8 - 23 mg/dL 33(H) 30(H) 32(H)  Creatinine 0.61 - 1.24 mg/dL 0.97 0.93 0.94  Sodium 135 - 145 mmol/L 145 144 149(H)  Potassium 3.5 - 5.1 mmol/L 3.4(L) 3.6 3.3(L)  Chloride 98 - 111 mmol/L 104 101 107  CO2 22 - 32 mmol/L 28 28 28   Calcium 8.9 - 10.3 mg/dL 8.3(L) 9.1 9.1  Total Protein 6.5 - 8.1 g/dL - - -  Total Bilirubin 0.3 - 1.2 mg/dL - - -  Alkaline Phos 38 - 126 U/L - - -  AST 15 - 41 U/L - - -  ALT 0 - 44 U/L - - -    Imaging studies: No new pertinent imaging studies   Assessment/Plan:  68 y.o. male with small bowel obstruction 1 Day Post-Op s/p lysis of adhesions and small bowel repair, complicated by pertinent comorbidities including severe developmental delay, aspiration pneumonia and seizure  disorder. Patient recovering slowly.  There has been no sign of complication at this moment.  Patient with adequate vital signs.  No fever no tachycardia.  Wound is dry and clean.  I recommend to continue with nasogastric tube until there is evidence of bowel function in this case will have to be a bowel movement since the patient will not be able to report when he passed gas.  Recommend to continue DVT prophylaxis and antibiotic therapy as per primary team.  Patient can take oral medication as needed.  Arnold Long, MD

## 2019-08-06 NOTE — Progress Notes (Signed)
PT Cancellation Note  Patient Details Name: Richard Gillespie MRN: [id number] DOB: 1951/05/05   Cancelled Treatment:    Reason Eval/Treat Not Completed: Medical issues which prohibited therapy(Per chart review, patient now status post abdominal surgery (ex-lap) to address SBO.  Per guidelines, will require new orders to resume PT service post-procedure.  Initial order completed; please re-consult as medically appropriate and available.)   Marleta Lapierre H. Owens Shark, PT, DPT, NCS 08/06/19, 10:25 AM [phone]

## 2019-08-06 NOTE — Progress Notes (Signed)
[employer] [facility] - [place] at [facility]   PATIENT NAME: Richard Gillespie    MR#:  [id number]  DATE OF BIRTH:  May 07, 1951  SUBJECTIVE:  CHIEF COMPLAINT:   Chief Complaint  Patient presents with  . Emesis  Patient seen and evaluated today S/p laparotomy Developmentally retarded Unable to obtain any history Awake and lying in the bed  REVIEW OF SYSTEMS:    ROS  Developmentally retarded unable to obtain any history  DRUG ALLERGIES:   Allergies  Allergen Reactions  . Zithromax [Azithromycin] Other (See Comments)    unknown    VITALS:  Blood pressure 134/76, pulse (!) 101, temperature 98.5 F (36.9 C), temperature source Oral, resp. rate 20, height 5\' 8"  (1.727 m), weight 68 kg, SpO2 96 %.  PHYSICAL EXAMINATION:   Physical Exam  GENERAL:  68 y.o.-year-old patient lying in the bed with NG tube to suction EYES: Pupils equal, round, reactive to light and accommodation. No scleral icterus. Extraocular muscles intact.  HEENT: Head atraumatic, normocephalic. Oropharynx NG tube noted NECK:  Supple, no jugular venous distention. No thyroid enlargement, no tenderness.  LUNGS: Normal breath sounds bilaterally, no wheezing, rales, rhonchi. No use of accessory muscles of respiration.  CARDIOVASCULAR: S1, S2 normal. No murmurs, rubs, or gallops.  ABDOMEN: Soft, nontender, Abdominal bandage noted. Bowel sounds decreased. No organomegaly or mass.  EXTREMITIES: No cyanosis, clubbing or edema b/l.    NEUROLOGIC: Developmentally retarded Moves all extremities PSYCHIATRIC: Could not be assessed SKIN: No obvious rash, lesion, or ulcer.   LABORATORY PANEL:   CBC Recent Labs  Lab 08/05/19 0635  WBC 9.7  HGB 16.3  HCT 49.6  PLT 200   ------------------------------------------------------------------------------------------------------------------ Chemistries  Recent Labs  Lab 08/04/19 0436  08/06/19 0412  NA 149*   < > 145  K 3.3*   < > 3.4*  CL 112*   < > 104  CO2  27   < > 28  GLUCOSE 122*   < > 207*  BUN 29*   < > 33*  CREATININE 0.88   < > 0.97  CALCIUM 8.8*   < > 8.3*  AST 83*  --   --   ALT 56*  --   --   ALKPHOS 46  --   --   BILITOT 0.9  --   --    < > = values in this interval not displayed.   ------------------------------------------------------------------------------------------------------------------  Cardiac Enzymes No results for input(s): TROPONINI in the last 168 hours. ------------------------------------------------------------------------------------------------------------------  RADIOLOGY:  Dg Abd 1 View  Result Date: 08/05/2019 CLINICAL DATA:  NG tube placement EXAM: ABDOMEN - 1 VIEW COMPARISON:  08/05/2019, 08/04/2019 FINDINGS: Esophageal tube tip in the left upper quadrant. Persistent gaseous dilatation of small bowel in the left abdomen. Airspace disease at the right base. IMPRESSION: 1. Esophageal tube tip in the left mid abdomen presumably projecting over the gastric body. Side-port at or just beyond GE junction. 2. Continued small bowel dilatation concerning for obstruction. Electronically Signed   By: Jasmine PangKim  Fujinaga M.D.   On: 08/05/2019 19:48   Dg Abd 1 View  Result Date: 08/05/2019 CLINICAL DATA:  Nasogastric tube placement EXAM: ABDOMEN - 1 VIEW COMPARISON:  Yesterday FINDINGS: Nasogastric tube with tip in the gastric fundus. The stomach and left upper quadrant small bowel is dilated. Volume loss and opacification at the right base also seen on prior CT. IMPRESSION: 1. Nasogastric tube with tip in the proximal stomach, which is distended. 2. Ongoing small bowel obstruction. Electronically Signed  By: Monte Fantasia M.D.   On: 08/05/2019 05:45   Dg Abd Portable 1v-small Bowel Obstruction Protocol-24 Hr Delay  Result Date: 08/05/2019 CLINICAL DATA:  Follow up small bowel obstruction EXAM: PORTABLE ABDOMEN - 1 VIEW COMPARISON:  08/04/2019 FINDINGS: The previously administered contrast has passed through the small bowel  into the colon. Persistent small bowel dilatation is again seen when compared with the prior study. This is predominately within the left abdomen. No bony abnormality is noted. No free air is seen. IMPRESSION: Previously administered contrast has passed distally. Persistent small bowel dilatation in the left abdomen is noted however. Electronically Signed   By: Inez Catalina M.D.   On: 08/05/2019 10:32   Dg Abd Portable 1v-small Bowel Obstruction Protocol-initial, 8 Hr Delay  Result Date: 08/04/2019 CLINICAL DATA:  Small-bowel obstruction 8 hour delay EXAM: PORTABLE ABDOMEN - 1 VIEW COMPARISON:  08/03/2019 FINDINGS: Multiple loops of dilated small bowel persist, measuring up to 5.2 cm. Dilute contrast within dilated small bowel but no contrast in the colon. Residual contrast in the urinary bladder. Partially visualized esophageal tube tip in the upper abdomen. IMPRESSION: Dilute contrast within markedly dilated small bowel, consistent with high-grade small bowel obstruction. No definitive contrast within the colon. Pelvic contrast is felt to represent contrast within the urinary bladder. Electronically Signed   By: Donavan Foil M.D.   On: 08/04/2019 19:53     ASSESSMENT AND PLAN:   68 year old male patient with history of mental retardation, seizure disorder, bowel obstruction in the past is a resident of group home currently under hospitalist service  -Small bowel obstruction S/p laparotomy and enterolysis and enterorraphy  surgery follow-up IV fluids N.p.o. for now  -Hypernatremia improved Switch IV fluids to D5 normal saline with potassium supplementation Follow-up sodium level  -Acute hypokalemia Improved  -Seizure disorder Continue Keppra and Lamictal via NGT  -DVT prophylaxis  Resume lovenox  -Aspiration pneumonia Zosyn antibiotic on board MRSA PCR negative vancomycin discontinued  All the records are reviewed and case discussed with Care Management/Social Worker. Management  plans discussed with the patient, family and they are in agreement.  CODE STATUS: DNR  DVT Prophylaxis: SCDs  TOTAL TIME TAKING CARE OF THIS PATIENT: 35 minutes.   POSSIBLE D/C IN  DAYS, DEPENDING ON CLINICAL CONDITION.  Saundra Shelling M.D on 08/06/2019 at 11:30 AM  Between 7am to 6pm - Pager - [phone]  After 6pm go to [email or web address] - password [email or web address] [place] [facility]  Office  [phone]  CC: Primary care physician; Baxter Hire, MD  Note: This dictation was prepared with Dragon dictation along with smaller phrase technology. Any transcriptional errors that result from this process are unintentional.

## 2019-08-07 ENCOUNTER — Inpatient Hospital Stay: Payer: Medicare Other

## 2019-08-07 LAB — BASIC METABOLIC PANEL
Anion gap: 8 (ref 5–15)
BUN: 26 mg/dL — ABNORMAL HIGH (ref 8–23)
CO2: 28 mmol/L (ref 22–32)
Calcium: 7.8 mg/dL — ABNORMAL LOW (ref 8.9–10.3)
Chloride: 112 mmol/L — ABNORMAL HIGH (ref 98–111)
Creatinine, Ser: 0.78 mg/dL (ref 0.61–1.24)
GFR calc Af Amer: >60 mL/min (ref >60)
GFR calc non Af Amer: >60 mL/min (ref >60)
Glucose, Bld: 164 mg/dL — ABNORMAL HIGH (ref 70–99)
Potassium: 4 mmol/L (ref 3.5–5.1)
Sodium: 148 mmol/L — ABNORMAL HIGH (ref 135–145)

## 2019-08-07 LAB — CBC
HCT: 44.5 % (ref 39.0–52.0)
Hemoglobin: 14.3 g/dL (ref 13.0–17.0)
MCH: 29.5 pg (ref 26.0–34.0)
MCHC: 32.1 g/dL (ref 30.0–36.0)
MCV: 91.9 fL (ref 80.0–100.0)
Platelets: 164 10*3/uL (ref 150–400)
RBC: 4.84 MIL/uL (ref 4.22–5.81)
RDW: 12.7 % (ref 11.5–15.5)
WBC: 13.6 10*3/uL — ABNORMAL HIGH (ref 4.0–10.5)
nRBC: 0 % (ref 0.0–0.2)

## 2019-08-07 MED ORDER — KCL IN DEXTROSE-NACL 20-5-0.45 MEQ/L-%-% IV SOLN
INTRAVENOUS | Status: DC
  Administered 2019-08-07 – 2019-08-12 (×10): via INTRAVENOUS
  Filled 2019-08-07 (×14): qty 1000

## 2019-08-07 MED ORDER — SODIUM CHLORIDE 0.9 % IV BOLUS
500.0000 mL | Freq: Once | INTRAVENOUS | Status: AC
  Administered 2019-08-07: 15:00:00 500 mL via INTRAVENOUS

## 2019-08-07 MED ORDER — INFLUENZA VAC A&B SA ADJ QUAD 0.5 ML IM PRSY
0.5000 mL | PREFILLED_SYRINGE | INTRAMUSCULAR | Status: DC
  Filled 2019-08-07: qty 0.5

## 2019-08-07 NOTE — Progress Notes (Signed)
[place] at [place] NAME: Richard Gillespie    MR#:  [id number]  DATE OF BIRTH:  11/26/1951  SUBJECTIVE:  CHIEF COMPLAINT:   Chief Complaint  Patient presents with  . Emesis  Patient seen and evaluated today Has NG tube with suction Developmentally retarded Unable to obtain any history Awake and lying in the bed Tolerated procedure ok  REVIEW OF SYSTEMS:    ROS  Developmentally retarded unable to obtain any history  DRUG ALLERGIES:   Allergies  Allergen Reactions  . Zithromax [Azithromycin] Other (See Comments)    unknown    VITALS:  Blood pressure 125/81, pulse 83, temperature (!) 97.5 F (36.4 C), temperature source Oral, resp. rate 18, height 5\' 8"  (1.727 m), weight 68 kg, SpO2 97 %.  PHYSICAL EXAMINATION:   Physical Exam  GENERAL:  68 y.o.-year-old patient lying in the bed with NG tube to suction EYES: Pupils equal, round, reactive to light and accommodation. No scleral icterus. Extraocular muscles intact.  HEENT: Head atraumatic, normocephalic. Oropharynx NG tube noted NECK:  Supple, no jugular venous distention. No thyroid enlargement, no tenderness.  LUNGS: Normal breath sounds bilaterally, no wheezing, rales, rhonchi. No use of accessory muscles of respiration.  CARDIOVASCULAR: S1, S2 normal. No murmurs, rubs, or gallops.  ABDOMEN: Soft, nontender, surgical site no discharge. Bowel sounds decreased. No organomegaly or mass.  EXTREMITIES: No cyanosis, clubbing or edema b/l.    NEUROLOGIC: Developmentally retarded Moves all extremities PSYCHIATRIC: Could not be assessed SKIN: No obvious rash, lesion, or ulcer.   LABORATORY PANEL:   CBC Recent Labs  Lab 08/07/19 0629  WBC 13.6*  HGB 14.3  HCT 44.5  PLT 164   ------------------------------------------------------------------------------------------------------------------ Chemistries  Recent Labs  Lab 08/04/19 0436  08/07/19 0629  NA 149*   < > 148*  K  3.3*   < > 4.0  CL 112*   < > 112*  CO2 27   < > 28  GLUCOSE 122*   < > 164*  BUN 29*   < > 26*  CREATININE 0.88   < > 0.78  CALCIUM 8.8*   < > 7.8*  AST 83*  --   --   ALT 56*  --   --   ALKPHOS 46  --   --   BILITOT 0.9  --   --    < > = values in this interval not displayed.   ------------------------------------------------------------------------------------------------------------------  Cardiac Enzymes No results for input(s): TROPONINI in the last 168 hours. ------------------------------------------------------------------------------------------------------------------  RADIOLOGY:  Dg Abd 1 View  Result Date: 08/05/2019 CLINICAL DATA:  NG tube placement EXAM: ABDOMEN - 1 VIEW COMPARISON:  08/05/2019, 08/04/2019 FINDINGS: Esophageal tube tip in the left upper quadrant. Persistent gaseous dilatation of small bowel in the left abdomen. Airspace disease at the right base. IMPRESSION: 1. Esophageal tube tip in the left mid abdomen presumably projecting over the gastric body. Side-port at or just beyond GE junction. 2. Continued small bowel dilatation concerning for obstruction. Electronically Signed   By: Donavan Foil M.D.   On: 08/05/2019 19:48     ASSESSMENT AND PLAN:   68 year old male patient with history of mental retardation, seizure disorder, bowel obstruction in the past is a resident of group home currently under hospitalist service  -Small bowel obstruction S/p laparotomy and lysis of adhesions  surgery follow-up Continue NG tube with suction IV fluids N.p.o. for now  -Hypernatremia  Switch IV fluids to D5 1/2 NS with potassium  supplementation Follow-up sodium level  -Acute hypokalemia Improved  -Seizure disorder Continue Keppra IV  -DVT prophylaxis with [place] lovenox daily  -Aspiration pneumonia Zosyn antibiotic on board MRSA PCR negative vancomycin discontinued   All the records are reviewed and case discussed with Care Management/Social  Worker. Management plans discussed with the patient, family and they are in agreement.  CODE STATUS: DNR  DVT Prophylaxis: SCDs  TOTAL TIME TAKING CARE OF THIS PATIENT: 35 minutes.   POSSIBLE D/C IN  DAYS, DEPENDING ON CLINICAL CONDITION.  Ihor AustinPavan Pyreddy M.D on 08/07/2019 at 10:22 AM  Between 7am to 6pm - Pager - [phone]  After 6pm go to [email or web address] - password [email or web address] [facility]  [facility] [place] [facility]  Office  [phone]  CC: Primary care physician; Gracelyn NurseJohnston, John D, MD  Note: This dictation was prepared with Dragon dictation along with smaller phrase technology. Any transcriptional errors that result from this process are unintentional.

## 2019-08-07 NOTE — Progress Notes (Signed)
Patient is lethargic and more confused today We will check CT head to assess for altered mental status

## 2019-08-07 NOTE — Progress Notes (Signed)
PT Cancellation Note  Patient Details Name: DAVEYON KITCHINGS MRN: [id number] DOB: 1951-10-01   Cancelled Treatment:    Reason Eval/Treat Not Completed: Other (comment)(PT entered room, pt drowsy, showed some mild agitation when attempted to raise [facility] to initiate evaluation. PT will re-attempt as able/when patient is more alert.)   Lieutenant Diego PT, DPT 10:26 AM,08/07/19 [phone]

## 2019-08-07 NOTE — Progress Notes (Signed)
[place] Hospital Day(s): 4.   Post op day(s): 2 Days Post-Op.   Interval History: Patient seen and examined, no acute events or new complaints overnight.  Nurses denies any new event.  There is no report of any bowel movement or gas.  Vital signs in last 24 hours: [min-max] current  Temp:  [97.5 F (36.4 C)-98.7 F (37.1 C)] 98.7 F (37.1 C) (09/11 1046) Pulse Rate:  [81-92] 91 (09/11 1046) Resp:  [18] 18 (09/10 1611) BP: (125-162)/(75-122) 144/77 (09/11 1046) SpO2:  [90 %-97 %] 90 % (09/11 1046)     Height: 5\' 8"  (172.7 cm) Weight: 68 kg BMI (Calculated): 22.81   Physical Exam:  Constitutional: alert, cooperative and no distress  Respiratory: breathing non-labored at rest  Cardiovascular: regular rate and sinus rhythm  Gastrointestinal: soft, non-tender, and non-distended.  Wound is dry and clean  Labs:  CBC Latest Ref Rng & Units 08/07/2019 08/05/2019 08/04/2019  WBC 4.0 - 10.5 K/uL 13.6(H) 9.7 8.1  Hemoglobin 13.0 - 17.0 g/dL 14.3 16.3 14.6  Hematocrit 39.0 - 52.0 % 44.5 49.6 44.4  Platelets 150 - 400 K/uL 164 200 198   CMP Latest Ref Rng & Units 08/07/2019 08/06/2019 08/05/2019  Glucose 70 - 99 mg/dL 164(H) 207(H) 160(H)  BUN 8 - 23 mg/dL 26(H) 33(H) 30(H)  Creatinine 0.61 - 1.24 mg/dL 0.78 0.97 0.93  Sodium 135 - 145 mmol/L 148(H) 145 144  Potassium 3.5 - 5.1 mmol/L 4.0 3.4(L) 3.6  Chloride 98 - 111 mmol/L 112(H) 104 101  CO2 22 - 32 mmol/L 28 28 28   Calcium 8.9 - 10.3 mg/dL 7.8(L) 8.3(L) 9.1  Total Protein 6.5 - 8.1 g/dL - - -  Total Bilirubin 0.3 - 1.2 mg/dL - - -  Alkaline Phos 38 - 126 U/L - - -  AST 15 - 41 U/L - - -  ALT 0 - 44 U/L - - -    Imaging studies: No new pertinent imaging studies   Assessment/Plan:  68 y.o. male with small bowel obstruction 2 Day Post-Op s/p lysis of adhesions and small bowel repair, complicated by pertinent comorbidities including severe developmental delay, aspiration pneumonia and seizure disorder. Patient  recovering slowly.  There is no sign of bowel function yet.  There is adequate electrolytes.  We will continue with NGT and bowel rest until patient has bowel function.  Appreciate hospitalist management of medical condition.  Patient on DVT prophylaxis.  Arnold Long, MD

## 2019-08-07 NOTE — Progress Notes (Signed)
PT Cancellation Note  Patient Details Name: ERLIN GARDELLA MRN: [id number] DOB: 09-Nov-1951   Cancelled Treatment:    Reason Eval/Treat Not Completed: (Patient re-attempted this AM, PT helped to attempt sit EOB, pt actively trying to lay down/resisting movement. Unable to answer questions, eyes closed. PT with re-attempt as able.)  Lieutenant Diego PT, DPT 11:44 AM,08/07/19 [phone]

## 2019-08-07 NOTE — Consult Note (Signed)
Pharmacy Antibiotic Note  Richard Gillespie is a 68 y.o. male admitted on 08/03/2019 with PNA/SBO.  Pharmacy has been consulted for Zosyn dosing. He was originally started on vancomycin and Zosyn, however the MRSA PCR was negative and vancomycin was discontinued. This is day #5 of IV antibiotics, WBC have bumped up again today and the patient is exhibiting new confusion/disorientation of unknown origin   Plan: continue Zosyn 3.375g EI Q8 hours  Height: 5\' 8"  (172.7 cm) Weight: 150 lb (68 kg) IBW/kg (Calculated) : 68.4  Temp (24hrs), Avg:97.7 F (36.5 C), Min:97.5 F (36.4 C), Max:97.9 F (36.6 C)  Recent Labs  Lab 08/03/19 0930 08/03/19 1405 08/04/19 0436 08/04/19 1347 08/04/19 1436 08/04/19 1712 08/04/19 2048 08/05/19 0635 08/06/19 0412 08/07/19 0629  WBC 14.4*  --  8.1  --   --   --   --  9.7  --  13.6*  CREATININE 1.12  --  0.88 0.89  --   --  0.94 0.93 0.97 0.78  LATICACIDVEN 1.5 2.8*  --   --  1.2 1.2  --   --   --   --     Estimated Creatinine Clearance: 86.2 mL/min (by C-G formula based on SCr of 0.78 mg/dL).    Allergies  Allergen Reactions  . Zithromax [Azithromycin] Other (See Comments)    unknown    Antimicrobials this admission: VAncomycin 9/7 >> 9/8 Zosyn 9/7 >>  Rocephin 9/7 x 1  Microbiology results: 9/7 BCx: NGTD 9/7 MRSA PCR (-) 9/7 SARS Co-V-2  Thank you for allowing pharmacy to be a part of this patient's care.  Dallie Piles, PharmD 08/07/2019 8:18 AM

## 2019-08-07 NOTE — Progress Notes (Signed)
PT Cancellation Note  Patient Details Name: Richard Gillespie MRN: [id number] DOB: 04-06-1951   Cancelled Treatment:    Reason Eval/Treat Not Completed: Fatigue/lethargy limiting ability to participate(Evaluation re-attempted.  Per notes, patient remains lethargic with increased confusion.  Head CT pending due to change in status.  Will continue hold pending results and clearance for exertional activity as patient able to participate.  Will continue efforts next date.)   [name] H. Owens Shark, PT, DPT, NCS 08/07/19, 3:44 PM [phone]

## 2019-08-07 NOTE — Plan of Care (Signed)
Pt was more drowsy today. He spent most of the day resting. He was not agitated and therefore seemed pain free. He was alert watching the activities in the room and people when they came to see him. He did not respond with words when asked questions but only grunted or would wave his hand. Vitals were taken with no elevations except for BP. Doctor was notified of change in patient's baseline. IV in right hand infiltrated. It was discontinued and right arm was elevated. Urine output was only 100 mL during my shift. Doctor was notified and 500 mL of NS bolus was ordered. Spoke with the patient's brother this morning and gave him an update.

## 2019-08-08 ENCOUNTER — Inpatient Hospital Stay: Payer: Medicare Other

## 2019-08-08 LAB — BASIC METABOLIC PANEL
Anion gap: 4 — ABNORMAL LOW (ref 5–15)
Anion gap: 7 (ref 5–15)
BUN: 12 mg/dL (ref 8–23)
BUN: 10 mg/dL (ref 8–23)
CO2: 25 mmol/L (ref 22–32)
CO2: 23 mmol/L (ref 22–32)
Calcium: 7.8 mg/dL — ABNORMAL LOW (ref 8.9–10.3)
Calcium: 8 mg/dL — ABNORMAL LOW (ref 8.9–10.3)
Chloride: 110 mmol/L (ref 98–111)
Chloride: 109 mmol/L (ref 98–111)
Creatinine, Ser: 0.63 mg/dL (ref 0.61–1.24)
Creatinine, Ser: 0.64 mg/dL (ref 0.61–1.24)
GFR calc Af Amer: >60 mL/min (ref >60)
GFR calc Af Amer: >60 mL/min (ref >60)
GFR calc non Af Amer: >60 mL/min (ref >60)
GFR calc non Af Amer: >60 mL/min (ref >60)
Glucose, Bld: 145 mg/dL — ABNORMAL HIGH (ref 70–99)
Glucose, Bld: 141 mg/dL — ABNORMAL HIGH (ref 70–99)
Potassium: 3.5 mmol/L (ref 3.5–5.1)
Potassium: 3.6 mmol/L (ref 3.5–5.1)
Sodium: 139 mmol/L (ref 135–145)
Sodium: 139 mmol/L (ref 135–145)

## 2019-08-08 LAB — CULTURE, BLOOD (ROUTINE X 2)
Culture: NO GROWTH
Culture: NO GROWTH
Special Requests: ADEQUATE
Special Requests: ADEQUATE

## 2019-08-08 LAB — CBC
HCT: 39.8 % (ref 39.0–52.0)
Hemoglobin: 13.1 g/dL (ref 13.0–17.0)
MCH: 29.3 pg (ref 26.0–34.0)
MCHC: 32.9 g/dL (ref 30.0–36.0)
MCV: 89 fL (ref 80.0–100.0)
Platelets: 166 10*3/uL (ref 150–400)
RBC: 4.47 MIL/uL (ref 4.22–5.81)
RDW: 12.4 % (ref 11.5–15.5)
WBC: 12.7 10*3/uL — ABNORMAL HIGH (ref 4.0–10.5)
nRBC: 0 % (ref 0.0–0.2)

## 2019-08-08 LAB — MAGNESIUM: Magnesium: 2 mg/dL (ref 1.7–2.4)

## 2019-08-08 MED ORDER — PANTOPRAZOLE SODIUM 40 MG IV SOLR
40.0000 mg | Freq: Every day | INTRAVENOUS | Status: DC
  Administered 2019-08-08 – 2019-08-13 (×6): 40 mg via INTRAVENOUS
  Filled 2019-08-08 (×6): qty 40

## 2019-08-08 NOTE — Progress Notes (Signed)
PT Cancellation Note  Patient Details Name: Richard Gillespie MRN: [id number] DOB: [birth year]   Cancelled Treatment:    Reason Eval/Treat Not Completed: Fatigue/lethargy limiting ability to participate;Patient's level of consciousness(Patient consult received and reviewed. Hold this afternoon due to fatigue per nursing. Will attempt again at later time/date when patient available.)  Janna Arch, PT, DPT   08/08/2019, 2:53 PM

## 2019-08-08 NOTE — Progress Notes (Signed)
[employer] [employer] - [place] at [facility]   PATIENT NAME: Richard Gillespie    MR#:  [id number]  DATE OF BIRTH:  [birth year]  SUBJECTIVE: Transfer the patient to surgical service, spoke with Dr. Hazle Quantintron-Diaz this morning.  CHIEF COMPLAINT:   Chief Complaint  Patient presents with  . Emesis  Has NG tube with suction Developmentally retarded Unable to obtain any history Awake and lying in the bed   REVIEW OF SYSTEMS:    ROS  Developmentally retarded unable to obtain any history  DRUG ALLERGIES:   Allergies  Allergen Reactions  . Zithromax [Azithromycin] Other (See Comments)    unknown    VITALS:  Blood pressure (!) 151/82, pulse 93, temperature 100.2 F (37.9 C), temperature source Oral, resp. rate 16, height 5\' 8"  (1.727 m), weight 68 kg, SpO2 94 %.  PHYSICAL EXAMINATION:   Physical Exam  GENERAL:  68 y.o.-year-old patient lying in the bed with NG tube to suction EYES: Pupils equal, round, reactive to light and accommodation. No scleral icterus. Extraocular muscles intact.  HEENT: Head atraumatic, normocephalic. Oropharynx NG tube noted NECK:  Supple, no jugular venous distention. No thyroid enlargement, no tenderness.  LUNGS: Normal breath sounds bilaterally, no wheezing, rales, rhonchi. No use of accessory muscles of respiration.  CARDIOVASCULAR: S1, S2 normal. No murmurs, rubs, or gallops.  ABDOMEN: Soft, nontender, surgical site no discharge. Bowel sounds decreased. No organomegaly or mass.  EXTREMITIES: No cyanosis, clubbing or edema b/l.    NEUROLOGIC: Developmentally retarded Moves all extremities PSYCHIATRIC: Could not be assessed SKIN: No obvious rash, lesion, or ulcer.   LABORATORY PANEL:   CBC Recent Labs  Lab 08/08/19 1043  WBC 12.7*  HGB 13.1  HCT 39.8  PLT 166   ------------------------------------------------------------------------------------------------------------------ Chemistries  Recent Labs  Lab 08/04/19 0436  08/08/19 1043   NA 149*   < > 139  K 3.3*   < > 3.6  CL 112*   < > 109  CO2 27   < > 23  GLUCOSE 122*   < > 141*  BUN 29*   < > 10  CREATININE 0.88   < > 0.64  CALCIUM 8.8*   < > 8.0*  MG  --   --  2.0  AST 83*  --   --   ALT 56*  --   --   ALKPHOS 46  --   --   BILITOT 0.9  --   --    < > = values in this interval not displayed.   ------------------------------------------------------------------------------------------------------------------  Cardiac Enzymes No results for input(s): TROPONINI in the last 168 hours. ------------------------------------------------------------------------------------------------------------------  RADIOLOGY:  Ct Head Wo Contrast  Result Date: 08/07/2019 CLINICAL DATA:  Altered mental status EXAM: CT HEAD WITHOUT CONTRAST TECHNIQUE: Contiguous axial images were obtained from the base of the skull through the vertex without intravenous contrast. COMPARISON:  10/08/2014, 01/12/2017 FINDINGS: Brain: Mild atrophic changes are identified. No findings to suggest acute hemorrhage or acute infarction are noted. Stable low-density lesion in the medial aspect of the right temporal lobe extending posteriorly into the posterior cranial fossa with some peripheral and central calcifications identified. The overall appearance is similar to that seen on the prior CT examination. Vascular: No hyperdense vessel or unexpected calcification. Skull: Normal. Negative for fracture or focal lesion. Sinuses/Orbits: No acute finding. Other: None. IMPRESSION: Stable hypodense lesion in the right middle and posterior cranial fossa unchanged from 2015. 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 Generalized atrophic changes without acute  abnormality. Electronically Signed   By: Inez Catalina M.D.   On: 08/07/2019 17:06   Dg Abd 2 Views  Result Date: 08/08/2019 CLINICAL DATA:  Ileus after GI surgery 08/05/2019. Nasogastric tube in place. EXAM: ABDOMEN - 2 VIEW COMPARISON:   08/05/2019 FINDINGS: Enteric tube coiled once over the stomach with tip in the body of the stomach just left of midline in the left upper quadrant. Lung bases unchanged. Bowel gas pattern nonobstructive. Skin staples vertically over the abdomen just right of midline compatible recent abdominal surgery. Small amount of free air under the right hemidiaphragm compatible recent postop state. Remainder the exam is unchanged. IMPRESSION: Nonobstructive bowel gas pattern. Postsurgical change with minimal postoperative free air under the right hemidiaphragm. Nasogastric tube coiled once over the stomach with tip in the body of the stomach in the left upper quadrant. Electronically Signed   By: Marin Olp M.D.   On: 08/08/2019 11:26     ASSESSMENT AND PLAN:   68 year old male patient with history of mental retardation, seizure disorder, bowel obstruction in the past is a resident of group home currently under hospitalist service  -Small bowel obstruction S/p laparotomy and lysis of adhesions Patient had surgery ,so surgery is the primary attending, appreciate Dr. Windell Moment taking the patient to their service/ medicine will be consulting, Continue NG tube with suction IV fluids N.p.o. for now   -Hypernatremia  Switch IV fluids to D5 1/2 NS with potassium supplementation Follow-up sodium level  -Acute hypokalemia Improved  -Seizure disorder Continue Keppra IV  -DVT prophylaxis with [place] lovenox daily  -Aspiration pneumonia Zosyn antibiotic on board MRSA PCR negative vancomycin discontinued No hypoxia, leukocytosis also improving.  All the records are reviewed and case discussed with Care Management/Social Worker. Management plans discussed with the patient, family and they are in agreement.  CODE STATUS: DNR  DVT Prophylaxis: SCDs  TOTAL TIME TAKING CARE OF THIS PATIENT: 35 minutes.   POSSIBLE D/C IN  DAYS, DEPENDING ON CLINICAL CONDITION.  Epifanio Lesches M.D on 08/08/2019 at  12:45 PM  Between 7am to 6pm - Pager - [phone]  After 6pm go to [email or web address] - password [email or web address] [place] [facility]  Office  [phone]  CC: Primary care physician; Baxter Hire, MD  Note: This dictation was prepared with Dragon dictation along with smaller phrase technology. Any transcriptional errors that result from this process are unintentional.

## 2019-08-08 NOTE — Progress Notes (Signed)
Agitation noted towards the evening. PRN haldol given with positive effects.  Patient had a BM today.   Wynema Birch, RN

## 2019-08-08 NOTE — Progress Notes (Signed)
[place] Hospital Day(s): 5.   Post op day(s): 3 Days Post-Op.   Interval History: Patient seen and examined, no acute events or new complaints overnight.  Nurse reported the patient has not had any bowel movement.  He reported that he has been more active today.  Vital signs in last 24 hours: [min-max] current  Temp:  [98.2 F (36.8 C)-100.2 F (37.9 C)] 100.2 F (37.9 C) (09/12 0500) Pulse Rate:  [83-94] 93 (09/12 0500) Resp:  [16-21] 16 (09/12 0500) BP: (144-154)/(77-82) 151/82 (09/12 0500) SpO2:  [90 %-99 %] 94 % (09/12 0500)     Height: 5\' 8"  (172.7 cm) Weight: 68 kg BMI (Calculated): 22.81   NGT: 1100 cc in last 24 hours.  Physical Exam:  Constitutional: alert and no distress  Respiratory: breathing non-labored at rest  Cardiovascular: regular rate and sinus rhythm  Gastrointestinal: soft, non-tender, and non-distended.  Wound is dry and clean  Labs:  CBC Latest Ref Rng & Units 08/07/2019 08/05/2019 08/04/2019  WBC 4.0 - 10.5 K/uL 13.6(H) 9.7 8.1  Hemoglobin 13.0 - 17.0 g/dL 14.3 16.3 14.6  Hematocrit 39.0 - 52.0 % 44.5 49.6 44.4  Platelets 150 - 400 K/uL 164 200 198   CMP Latest Ref Rng & Units 08/08/2019 08/07/2019 08/06/2019  Glucose 70 - 99 mg/dL 145(H) 164(H) 207(H)  BUN 8 - 23 mg/dL 12 26(H) 33(H)  Creatinine 0.61 - 1.24 mg/dL 0.63 0.78 0.97  Sodium 135 - 145 mmol/L 139 148(H) 145  Potassium 3.5 - 5.1 mmol/L 3.5 4.0 3.4(L)  Chloride 98 - 111 mmol/L 110 112(H) 104  CO2 22 - 32 mmol/L 25 28 28   Calcium 8.9 - 10.3 mg/dL 7.8(L) 7.8(L) 8.3(L)  Total Protein 6.5 - 8.1 g/dL - - -  Total Bilirubin 0.3 - 1.2 mg/dL - - -  Alkaline Phos 38 - 126 U/L - - -  AST 15 - 41 U/L - - -  ALT 0 - 44 U/L - - -    Imaging studies: No new pertinent imaging studies   Assessment/Plan:  68 y.o.malewith small bowel obstruction3 Day Post-Ops/p lysis of adhesions and small bowel repair, complicated by pertinent comorbidities includingsevere developmental delay,  aspiration pneumonia and seizure disorder. Patient recovering slowly..  Apparently with postoperative ileus with increasing output through the NG tube.  We will continue with NGT in place.  We will continue with IV hydration.  We will continue to follow-up labs for assessment of electrolytes and the trend of the white blood cell count.  I will order abdominal x-ray for evaluation of bowel gas pattern. New hospitalist does not feel comfortable with patient and her service since he had surgery.  I will transfer the patient to my service to continue his care.  Patient needs to continue care by hospitalist for his medical condition, pneumonia and UTI.   We will continue with DVT prophylaxis.  We will continue PUD prophylaxis.  Arnold Long, MD

## 2019-08-08 NOTE — Progress Notes (Signed)
Updated pt's brother (legal guardian) Monica Martinez over the phone.   Flu vaccine due per MAR. Legal guardian declined. He states he will get one at the group home later.

## 2019-08-08 NOTE — Progress Notes (Signed)
PT Cancellation Note  Patient Details Name: Richard Gillespie MRN: [id number] DOB: Sep 08, 1951   Cancelled Treatment:    Reason Eval/Treat Not Completed: Other (comment);Fatigue/lethargy limiting ability to participate;Patient's level of consciousness(Patient consult received and reviewed. Sitter present in room, assisted sitter with total A to slide patient higher in bed. Patient not responsive to any cueing, verbalization, or demonstrating awareness of PT. Will attempt again at later time/date.)  Janna Arch, PT, DPT   08/08/2019, 10:55 AM

## 2019-08-09 ENCOUNTER — Inpatient Hospital Stay: Payer: Medicare Other

## 2019-08-09 MED ORDER — ACETAMINOPHEN 325 MG PO TABS
650.0000 mg | ORAL_TABLET | Freq: Four times a day (QID) | ORAL | Status: DC | PRN
  Administered 2019-08-09: 21:00:00 650 mg via ORAL

## 2019-08-09 MED ORDER — ACETAMINOPHEN 325 MG PO TABS
ORAL_TABLET | ORAL | Status: AC
  Administered 2019-08-09: 650 mg via ORAL
  Filled 2019-08-09: qty 2

## 2019-08-09 MED ORDER — MAGNESIUM HYDROXIDE 400 MG/5ML PO SUSP
15.0000 mL | Freq: Every day | ORAL | Status: DC
  Administered 2019-08-09: 11:00:00 15 mL via ORAL
  Filled 2019-08-09: qty 30

## 2019-08-09 NOTE — Progress Notes (Signed)
[place] [facility] [place] NAME: Richard Gillespie    MR#:  [id number]  DATE OF BIRTH:  [birth year]  SUBJECTIVE: Had BM yesterday.  Will discontinue Foley catheter, discussed with nurse.  More alert today.  CHIEF COMPLAINT:   Chief Complaint  Patient presents with  . Emesis  Has NG tube with suction Developmentally retarded nable to obtain any history    REVIEW OF SYSTEMS:    ROS  Developmentally retarded unable to obtain any history  DRUG ALLERGIES:   Allergies  Allergen Reactions  . Zithromax [Azithromycin] Other (See Comments)    unknown    VITALS:  Blood pressure (!) 150/84, pulse 77, temperature 97.8 F (36.6 C), temperature source Oral, resp. rate 18, height 5\' 8"  (1.727 m), weight 68 kg, SpO2 99 %.  PHYSICAL EXAMINATION:   Physical Exam  GENERAL:  68 y.o.-year-old patient lying in the bed with NG tube to suction EYES: Pupils equal, round, reactive to light and accommodation. No scleral icterus. Extraocular muscles intact.  HEENT: Head atraumatic, normocephalic. Oropharynx NG tube noted NECK:  Supple, no jugular venous distention. No thyroid enlargement, no tenderness.  LUNGS: Normal breath sounds bilaterally, no wheezing, rales, rhonchi. No use of accessory muscles of respiration.  CARDIOVASCULAR: S1, S2 normal. No murmurs, rubs, or gallops.  ABDOMEN: Soft, nontender, surgical site no discharge. Bowel sounds decreased. No organomegaly or mass.  EXTREMITIES: No cyanosis, clubbing or edema b/l.    NEUROLOGIC: Developmentally retarded Moves all extremities PSYCHIATRIC: Could not be assessed SKIN: No obvious rash, lesion, or ulcer.   LABORATORY PANEL:   CBC Recent Labs  Lab 08/08/19 1043  WBC 12.7*  HGB 13.1  HCT 39.8  PLT 166   ------------------------------------------------------------------------------------------------------------------ Chemistries  Recent Labs  Lab 08/04/19 0436  08/08/19 1043  NA 149*   < >  139  K 3.3*   < > 3.6  CL 112*   < > 109  CO2 27   < > 23  GLUCOSE 122*   < > 141*  BUN 29*   < > 10  CREATININE 0.88   < > 0.64  CALCIUM 8.8*   < > 8.0*  MG  --   --  2.0  AST 83*  --   --   ALT 56*  --   --   ALKPHOS 46  --   --   BILITOT 0.9  --   --    < > = values in this interval not displayed.   ------------------------------------------------------------------------------------------------------------------  Cardiac Enzymes No results for input(s): TROPONINI in the last 168 hours. ------------------------------------------------------------------------------------------------------------------  RADIOLOGY:  Ct Head Wo Contrast  Result Date: 08/07/2019 CLINICAL DATA:  Altered mental status EXAM: CT HEAD WITHOUT CONTRAST TECHNIQUE: Contiguous axial images were obtained from the base of the skull through the vertex without intravenous contrast. COMPARISON:  10/08/2014, 01/12/2017 FINDINGS: Brain: Mild atrophic changes are identified. No findings to suggest acute hemorrhage or acute infarction are noted. Stable low-density lesion in the medial aspect of the right temporal lobe extending posteriorly into the posterior cranial fossa with some peripheral and central calcifications identified. The overall appearance is similar to that seen on the prior CT examination. Vascular: No hyperdense vessel or unexpected calcification. Skull: Normal. Negative for fracture or focal lesion. Sinuses/Orbits: No acute finding. Other: None. IMPRESSION: Stable hypodense lesion in the right middle and posterior cranial fossa unchanged from 2015. 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 Generalized atrophic changes without acute abnormality. Electronically  Signed   By: Alcide CleverMark  Lukens M.D.   On: 08/07/2019 17:06   Dg Abd 2 Views  Result Date: 08/08/2019 CLINICAL DATA:  Ileus after GI surgery 08/05/2019. Nasogastric tube in place. EXAM: ABDOMEN - 2 VIEW COMPARISON:  08/05/2019 FINDINGS:  Enteric tube coiled once over the stomach with tip in the body of the stomach just left of midline in the left upper quadrant. Lung bases unchanged. Bowel gas pattern nonobstructive. Skin staples vertically over the abdomen just right of midline compatible recent abdominal surgery. Small amount of free air under the right hemidiaphragm compatible recent postop state. Remainder the exam is unchanged. IMPRESSION: Nonobstructive bowel gas pattern. Postsurgical change with minimal postoperative free air under the right hemidiaphragm. Nasogastric tube coiled once over the stomach with tip in the body of the stomach in the left upper quadrant. Electronically Signed   By: Elberta Fortisaniel  Boyle M.D.   On: 08/08/2019 11:26     ASSESSMENT AND PLAN:   68 year old male patient with history of mental retardation, seizure disorder, bowel obstruction in the past is a resident of group home currently under hospitalist service  -Small bowel obstruction S/p laparotomy and lysis of adhesions Surgery is following.  Surgery seeing clamp the NG tube and start clear liquids trial.  -Hypernatremia, improved.  And patient can take oral medicines discontinue IV fluids overnight.  -Acute hypokalemia Improved  -Seizure disorder Continue Keppra IV  -DVT prophylaxis with [place] lovenox daily  -Aspiration pneumonia Zosyn antibiotic on board, patient is not hypoxic.  Continue aspiration precautions. MRSA PCR negative vancomycin discontinued   Discontinue Foley catheter, use condom catheter.  discussed with nurse.  All the records are reviewed and case discussed with Care Management/Social Worker. Management plans discussed with the patient, family and they are in agreement.  CODE STATUS: DNR  DVT Prophylaxis: SCDs  TOTAL TIME TAKING CARE OF THIS PATIENT: 35 minutes.   POSSIBLE D/C IN  DAYS, DEPENDING ON CLINICAL CONDITION.  Katha HammingSnehalatha Chevelle Coulson M.D on 08/09/2019 at 11:01 AM  Between 7am to 6pm - Pager -  [phone]  After 6pm go to [email or web address] - password [email or web address] [facility]  [facility]  Office  [phone]  CC: Primary care physician; Gracelyn NurseJohnston, John D, MD  Note: This dictation was prepared with Dragon dictation along with smaller phrase technology. Any transcriptional errors that result from this process are unintentional.

## 2019-08-09 NOTE — Progress Notes (Signed)
[place] Hospital Day(s): 6.   Post op day(s): 4 Days Post-Op.   Interval History: Patient seen and examined, no acute events or new complaints overnight. Nurse report he had a bowel movement yesterday. Denies nausea and vomiting. NGT decreased from 1200 mL in 24 hours to 600 mL in last 24 hours. No fever or chills.  Vital signs in last 24 hours: [min-max] current  Temp:  [97.8 F (36.6 C)-99.6 F (37.6 C)] 97.8 F (36.6 C) (09/13 0416) Pulse Rate:  [75-80] 77 (09/13 0416) Resp:  [18] 18 (09/13 0416) BP: (135-158)/(76-84) 150/84 (09/13 0416) SpO2:  [88 %-100 %] 99 % (09/13 0416)     Height: 5\' 8"  (172.7 cm) Weight: 68 kg BMI (Calculated): 22.81   NGT: 600 mL  Physical Exam:  Constitutional: alert, cooperative and no distress  Respiratory: breathing non-labored at rest  Cardiovascular: regular rate and sinus rhythm  Gastrointestinal: soft, non-tender, and non-distended. Wound is dry and clean.   Labs:  CBC Latest Ref Rng & Units 08/08/2019 08/07/2019 08/05/2019  WBC 4.0 - 10.5 K/uL 12.7(H) 13.6(H) 9.7  Hemoglobin 13.0 - 17.0 g/dL 13.1 14.3 16.3  Hematocrit 39.0 - 52.0 % 39.8 44.5 49.6  Platelets 150 - 400 K/uL 166 164 200   CMP Latest Ref Rng & Units 08/08/2019 08/08/2019 08/07/2019  Glucose 70 - 99 mg/dL 141(H) 145(H) 164(H)  BUN 8 - 23 mg/dL 10 12 26(H)  Creatinine 0.61 - 1.24 mg/dL 0.64 0.63 0.78  Sodium 135 - 145 mmol/L 139 139 148(H)  Potassium 3.5 - 5.1 mmol/L 3.6 3.5 4.0  Chloride 98 - 111 mmol/L 109 110 112(H)  CO2 22 - 32 mmol/L 23 25 28   Calcium 8.9 - 10.3 mg/dL 8.0(L) 7.8(L) 7.8(L)  Total Protein 6.5 - 8.1 g/dL - - -  Total Bilirubin 0.3 - 1.2 mg/dL - - -  Alkaline Phos 38 - 126 U/L - - -  AST 15 - 41 U/L - - -  ALT 0 - 44 U/L - - -    Imaging studies: No new pertinent imaging studies   Assessment/Plan:  68 y.o.malewith small bowel obstruction4Day Post-Ops/p lysis of adhesions and small bowel repair, complicated by pertinent  comorbidities includingsevere developmental delay, aspiration pneumonia and seizure disorder. Patient recovering slowly.  Ileus seem to be resolving. Had a bowel movement yesterday. Abdominal xray with non obstructive pattern. Adequate electrolytes. Will clamp NGT and give a clear liquid trial. Will add milk of magnesia. Will continue with DVT prophylaxis. Appreciate Hospitalist recommendations and management of medical conditions.   Arnold Long, MD

## 2019-08-09 NOTE — Progress Notes (Signed)
NG tube clamped from suction  at 10 am. Patient is now on clears. Meal offered at lunch but pt refused as well as dinner. He agreed to a few sips of water. Refused his PO med at 5.  New weak congested cough noticed. MD Vianne Bulls made aware and received verbal orders for a Chest x Ray and aspiration precautions Foley discontinued this morning. Patient has had an adequate urine output and 1 BM. telesitter in place.

## 2019-08-09 NOTE — Consult Note (Signed)
Pharmacy Antibiotic Note  Richard Gillespie is a 68 y.o. male admitted on 08/03/2019 with PNA/SBO.  Pharmacy has been consulted for Zosyn dosing. He was originally started on vancomycin and Zosyn, however the MRSA PCR was negative and vancomycin was discontinued. This is day #7 of IV antibiotics, WBC have remains elevated.    Plan: Continue Zosyn 3.375g EI Q8 hours.  Height: 5\' 8"  (172.7 cm) Weight: 150 lb (68 kg) IBW/kg (Calculated) : 68.4  Temp (24hrs), Avg:98.2 F (36.8 C), Min:97.8 F (36.6 C), Max:98.5 F (36.9 C)  Recent Labs  Lab 08/03/19 0930 08/03/19 1405 08/04/19 0436  08/04/19 1436 08/04/19 1712  08/05/19 0635 08/06/19 0412 08/07/19 0629 08/08/19 0450 08/08/19 1043  WBC 14.4*  --  8.1  --   --   --   --  9.7  --  13.6*  --  12.7*  CREATININE 1.12  --  0.88   < >  --   --    < > 0.93 0.97 0.78 0.63 0.64  LATICACIDVEN 1.5 2.8*  --   --  1.2 1.2  --   --   --   --   --   --    < > = values in this interval not displayed.    Estimated Creatinine Clearance: 86.2 mL/min (by C-G formula based on SCr of 0.64 mg/dL).    Allergies  Allergen Reactions  . Zithromax [Azithromycin] Other (See Comments)    unknown    Antimicrobials this admission: VAncomycin 9/7 >> 9/8 Zosyn 9/7 >>  Rocephin 9/7 x 1  Microbiology results: 9/7 BCx: no growth  9/7 MRSA PCR: negative  9/7 SARS Co-V-2: negative   Thank you for allowing pharmacy to be a part of this patient's care.  Jillayne Witte L, RPh 08/09/2019 3:23 PM

## 2019-08-09 NOTE — Progress Notes (Signed)
Family member Judge Duque (legal Guardian) updated over the phone.

## 2019-08-09 NOTE — Progress Notes (Signed)
PT Cancellation Note  Patient Details Name: JEORGE REISTER MRN: [id number] DOB: 1951/07/15   Cancelled Treatment:    Patient refused PT eval and was agitated with PT for attempting any removing of bed covers prior to his refusal.     Alanson Puls, PT DPT 08/09/2019, 11:20 AM

## 2019-08-09 NOTE — Progress Notes (Signed)
Verbal orders received from MD Konidena to discontinue foley cath and place a condom cath instead. Also, reorder sitter 1:1 order.

## 2019-08-10 ENCOUNTER — Inpatient Hospital Stay: Payer: Medicare Other

## 2019-08-10 DIAGNOSIS — Z881 Allergy status to other antibiotic agents status: Secondary | ICD-10-CM

## 2019-08-10 DIAGNOSIS — Z8669 Personal history of other diseases of the nervous system and sense organs: Secondary | ICD-10-CM

## 2019-08-10 DIAGNOSIS — X58XXXA Exposure to other specified factors, initial encounter: Secondary | ICD-10-CM

## 2019-08-10 DIAGNOSIS — R197 Diarrhea, unspecified: Secondary | ICD-10-CM

## 2019-08-10 DIAGNOSIS — F79 Unspecified intellectual disabilities: Secondary | ICD-10-CM

## 2019-08-10 DIAGNOSIS — Z8701 Personal history of pneumonia (recurrent): Secondary | ICD-10-CM

## 2019-08-10 DIAGNOSIS — Z960 Presence of urogenital implants: Secondary | ICD-10-CM

## 2019-08-10 DIAGNOSIS — K565 Intestinal adhesions [bands], unspecified as to partial versus complete obstruction: Secondary | ICD-10-CM

## 2019-08-10 DIAGNOSIS — Z8719 Personal history of other diseases of the digestive system: Secondary | ICD-10-CM

## 2019-08-10 DIAGNOSIS — S46211A Strain of muscle, fascia and tendon of other parts of biceps, right arm, initial encounter: Secondary | ICD-10-CM

## 2019-08-10 DIAGNOSIS — S40021A Contusion of right upper arm, initial encounter: Secondary | ICD-10-CM

## 2019-08-10 LAB — BASIC METABOLIC PANEL
Anion gap: 11 (ref 5–15)
BUN: 14 mg/dL (ref 8–23)
CO2: 23 mmol/L (ref 22–32)
Calcium: 8.6 mg/dL — ABNORMAL LOW (ref 8.9–10.3)
Chloride: 104 mmol/L (ref 98–111)
Creatinine, Ser: 0.66 mg/dL (ref 0.61–1.24)
GFR calc Af Amer: >60 mL/min (ref >60)
GFR calc non Af Amer: >60 mL/min (ref >60)
Glucose, Bld: 125 mg/dL — ABNORMAL HIGH (ref 70–99)
Potassium: 3.5 mmol/L (ref 3.5–5.1)
Sodium: 138 mmol/L (ref 135–145)

## 2019-08-10 LAB — URINALYSIS, COMPLETE (UACMP) WITH MICROSCOPIC
Bacteria, UA: NONE SEEN
Bilirubin Urine: NEGATIVE
Glucose, UA: NEGATIVE mg/dL
Hgb urine dipstick: NEGATIVE
Ketones, ur: 5 mg/dL — AB
Leukocytes,Ua: NEGATIVE
Nitrite: NEGATIVE
Protein, ur: 30 mg/dL — AB
Specific Gravity, Urine: 1.03 (ref 1.005–1.030)
Squamous Epithelial / HPF: NONE SEEN (ref 0–5)
pH: 5 (ref 5.0–8.0)

## 2019-08-10 LAB — CBC
HCT: 42 % (ref 39.0–52.0)
Hemoglobin: 14.6 g/dL (ref 13.0–17.0)
MCH: 29.7 pg (ref 26.0–34.0)
MCHC: 34.8 g/dL (ref 30.0–36.0)
MCV: 85.5 fL (ref 80.0–100.0)
Platelets: 204 10*3/uL (ref 150–400)
RBC: 4.91 MIL/uL (ref 4.22–5.81)
RDW: 12.2 % (ref 11.5–15.5)
WBC: 14.8 10*3/uL — ABNORMAL HIGH (ref 4.0–10.5)
nRBC: 0 % (ref 0.0–0.2)

## 2019-08-10 MED ORDER — FLUCONAZOLE 100MG IVPB
100.0000 mg | INTRAVENOUS | Status: DC
  Administered 2019-08-10 – 2019-08-12 (×3): 100 mg via INTRAVENOUS
  Filled 2019-08-10 (×4): qty 50

## 2019-08-10 MED ORDER — STARCH (THICKENING) PO POWD
ORAL | Status: DC | PRN
  Filled 2019-08-10: qty 227

## 2019-08-10 NOTE — Evaluation (Signed)
Clinical/Bedside Swallow Evaluation Patient Details  Name: Richard Gillespie MRN: [id number] Date of Birth: 26-Sep-1951  Today's Date: 08/10/2019 Time: SLP Start Time (ACUTE ONLY): 0093 SLP Stop Time (ACUTE ONLY): 0945 SLP Time Calculation (min) (ACUTE ONLY): 40 min  Past Medical History:  Past Medical History:  Diagnosis Date  . Mental retardation   . Seizures ([place])    Past Surgical History:  Past Surgical History:  Procedure Laterality Date  . COLON SURGERY    . LAPAROTOMY N/A 08/05/2019   Procedure: EXPLORATORY LAPAROTOMY;  Surgeon: Herbert Pun, MD;  Location: [facility] ORS;  Service: General;  Laterality: N/A;   HPI:  Patient is a 68 year old male with history of seizures, mental retardation, pneumonia and prior history of small bowel obstruction w/ surgery admitted from group home with complaints of nausea and vomiting and decreased p.o. intake.  Evaluated in the emergency room and found to have high-grade small bowel obstruction.  Patient also diagnosed with sepsis secondary to aspiration pneumonia from the nausea and vomiting.  Started empirically on antibiotics with IV vancomycin and Zosyn.  General surgery consulted for management of small bowel obstruction.  NG tube for decompression placed.  Per staff at group home, patient has been refusing to eat and drink since Saturday. Staff member states patient has had 2 ensures since Saturday.  Patient has vomited x 3 in the past 24 hours.  Pt has a baseline of MR and h/o seizures and high-grade small bowel obstruction in past per chart.  Surgery has followed; NG tube for decompression is removed now.  Pt has a baseline of malnutrition in context of social or environmental circumstances per chart notes.     Assessment / Plan / Recommendation Clinical Impression  This was a quite limited BSE performed today d/t adamant pt refusal to take trials of PO's. Repeated attempts and offerings were made to complete evaluation using different types of  drinks/foods but pt continued to refuse and became mildly agitated w/ too many questions. He did accept trials of thin liquids (water) x1 from SLP. Pt's phonations/verbalizations would grow louder if SLP continued to offer trials so only the trials of water were consumed. Pt consumed sips of thin liquids demonstrating min to mod oral prep difficulties, but pt noted to have microcephaly so this may be baseline. No overt s/s of aspiration were noted during these trials/swallows; no decline in respiratory status post trials. Phonations appeared dry, baseline. pt accepted 2 trials of water via straw from SLP. Noted decreased labial tone during initial closure on straw; adequate tongue position and intraoral pressure for pulling liquid through the straw. No anterior spillage noted. Quick swallows. During the quick trials, noted what appeared to be Significant TRHUSH - a heavily coated tongue which may be impacting pt's desire for PO's. Recommend IV method of treatment if needed d/t pt's declined Cognitive status. Pt is unlikely to obtain adequate nutrition by mouth currently but when he is ready from a Surgery/GI standpoint, recommend a Puree/Minced foods diet w/ thin liquids; aspiration precautions; Pills Crushed in puree. Recommend NSG Supervision w/ any oral intake. SLP to f/u with toleration of oral diet while admitted.  SLP Visit Diagnosis: Dysphagia, oropharyngeal phase (R13.12)    Aspiration Risk  Mild aspiration risk;Moderate aspiration risk;Risk for inadequate nutrition/hydration(d/t his GI issues as well)    Diet Recommendation  continue w/ current full liquid diet(thin liquids) as per Surgery - recommend upgrade to Pureed diet w/ thin liquids when ready for such; aspiration precautions; feeding support at  all meals  Medication Administration: Crushed with puree    Other  Recommendations Recommended Consults: (Dietician and Surgery following) Oral Care Recommendations: Oral care BID;Staff/trained  caregiver to provide oral care Other Recommendations: (n/a)   Follow up Recommendations (TBD)      Frequency and Duration min 2x/week  1 week       Prognosis Prognosis for Safe Diet Advancement: Fair Barriers to Reach Goals: Cognitive deficits;Time post onset;Severity of deficits;Behavior      Swallow Study   General Date of Onset: 08/03/19 HPI: Patient is a 68 year old male with history of seizures, mental retardation, pneumonia and prior history of small bowel obstruction w/ surgery admitted from group home with complaints of nausea and vomiting and decreased p.o. intake.  Evaluated in the emergency room and found to have high-grade small bowel obstruction.  Patient also diagnosed with sepsis secondary to aspiration pneumonia from the nausea and vomiting.  Started empirically on antibiotics with IV vancomycin and Zosyn.  General surgery consulted for management of small bowel obstruction.  NG tube for decompression placed.  Per staff at group home, patient has been refusing to eat and drink since Saturday. Staff member states patient has had 2 ensures since Saturday.  Patient has vomited x 3 in the past 24 hours.  Pt has a baseline of MR and h/o seizures and high-grade small bowel obstruction in past per chart.  Surgery has followed; NG tube for decompression is removed now.  Pt has a baseline of malnutrition in context of social or environmental circumstances per chart notes.   Type of Study: Bedside Swallow Evaluation Previous Swallow Assessment: 08/2018 Diet Prior to this Study: Dysphagia 2 (chopped);Thin liquids Temperature Spikes Noted: No(wbc 14.8) Respiratory Status: Room air History of Recent Intubation: Yes Length of Intubations (days): 1 days Date extubated: 08/06/19 Behavior/Cognition: Alert;Cooperative;Agitated;Requires cueing(baseline MR; intermittent cooperation) Oral Cavity Assessment: (unable to fully assess - but strongly suspect Thrush) Oral Care Completed by SLP:  No(pt refused) Oral Cavity - Dentition: Missing dentition Vision: (n/a) Self-Feeding Abilities: Total assist Patient Positioning: Upright in chair Baseline Vocal Quality: (phonations were strong) Volitional Cough: Cognitively unable to elicit Volitional Swallow: Unable to elicit    Oral/Motor/Sensory Function Overall Oral Motor/Sensory Function: Mild impairment(decreased labial tone)   Ice Chips Ice chips: Not tested Other Comments: refused   Thin Liquid Thin Liquid: Within functional limits Presentation: Straw(2 trials) Other Comments: pt accepted 2 trials of water via straw from SLP. Noted decreased labial tone during initial closure on straw; adequate tongue position and intraoral pressure for pulling liquid through the straw. No anterior spillage noted. Quick swallows.     Nectar Thick Nectar Thick Liquid: Not tested   Honey Thick Honey Thick Liquid: Not tested   Puree Puree: Not tested Other Comments: refused   Solid     Solid: Not tested Other Comments: refused       Jerilynn Som , MS, CCC-SLP , 08/10/2019,4:18 PM

## 2019-08-10 NOTE — Evaluation (Signed)
Physical Therapy Evaluation Patient Details Name: Retia PasseMark T Hulet MRN: [id number] DOB: 1951-05-12 Today's Date: 08/10/2019   History of Present Illness  presented to ER secondary to onset of emesis, decreased PO intake; admitted for management of SBO, s/p ex lap with extensive LOA (9/9).  Clinical Impression  Upon evaluation, patient alert and oriented to self; inconsistently follows simple verbal commands, often requiring therapist demonstration and hand-over-hand assist to initiate and complete functional movement patterns.  L hand fisted/contracted and bilat knees appear to be lacking full extension; but strength and ROM otherwise grossly [facility] for basic transfers and mobility efforts.  Currently requiring mod assist +2 for bed mobility; min cga/min assist for unsupported sitting balance; mod assist +2 with bilat HHA for sit/stand, basic transfer (bed/chair) and short-distance gait (5').  Maintains triple-flexed posture and demonstrates significant balance deficits; generally impulsive with movement.  Limited ability to comprehend and integrate cuing for activity/safety modifications. Would benefit from skilled PT to address above deficits and promote optimal return to PLOF; recommend transition to STR upon discharge from acute hospitalization. May benefit from mobility reassessment with familiar support from group home present; will discuss with RNCM.  Of note, once upright, patient noted with generalized edema to R UE; RN informed/aware and to discuss with MD (doppler?).  Patient ultimately insistent on transfer to chair once upright (refused return to bed); R UE propped on pillows for edema management.     Follow Up Recommendations SNF(may benefit from reassessment with familiar support from group home present; [facility])    Equipment Recommendations       Recommendations for Other Services       Precautions / Restrictions Precautions Precautions: Fall Precaution Comments:  Full liquids Restrictions Weight Bearing Restrictions: No      Mobility  Bed Mobility Overal bed mobility: Needs Assistance Bed Mobility: Supine to Sit     Supine to sit: Mod assist;+2 for physical assistance     General bed mobility comments: assist for LE position and truncal elevation; hand-over-hand to initiate movement  Transfers Overall transfer level: Needs assistance Equipment used: 2 person hand held assist Transfers: Sit to/from Stand Sit to Stand: Mod assist;+2 physical assistance         General transfer comment: maintains flexed posture at trunk, hips and knees; poor balance; generally impulsive  Ambulation/Gait Ambulation/Gait assistance: Mod assist;+2 physical assistance Gait Distance (Feet): 5 Feet Assistive device: 2 person hand held assist       General Gait Details: maintains flexed posture at trunk, hips and knees; poor balance; generally impulsive  Stairs            Wheelchair Mobility    Modified Rankin (Stroke Patients Only)       Balance Overall balance assessment: Needs assistance Sitting-balance support: No upper extremity supported;Feet supported Sitting balance-Leahy Scale: Fair     Standing balance support: Bilateral upper extremity supported Standing balance-Leahy Scale: Poor                               Pertinent Vitals/Pain Pain Assessment: No/denies pain    Home Living Family/patient expects to be discharged to:: Group home                 Additional Comments: Patient unable to provide details regarding social history    Prior Function           Comments: Patient unable to provide details related to PLOF.  Per previous notes, was ambulatory with HHA (+2), cga (08/2018)     Hand Dominance        Extremity/Trunk Assessment   Upper Extremity Assessment Upper Extremity Assessment: (grossly at least 4-/5, L hand fisted (contracted))    Lower Extremity Assessment Lower Extremity  Assessment: (grossly at least 4-/5, lacking full knee extension bilat.  Formal assessment limited (patient pulling away at times))       Communication   Communication: (largely mumbled and incoherent at times; does clearly utilize yes/no and goodbye appropriately during session)  Cognition Arousal/Alertness: Awake/alert Behavior During Therapy: WFL for tasks assessed/performed Overall Cognitive Status: No family/caregiver present to determine baseline cognitive functioning                                        General Comments      Exercises Other Exercises Other Exercises: Multiple attempts at rolling, supine to sit; extensive cuing, encouragement and hand-over-hand to complete   Assessment/Plan    PT Assessment Patient needs continued PT services  PT Problem List Decreased strength;Decreased range of motion;Decreased activity tolerance;Decreased balance;Decreased mobility;Decreased coordination;Decreased cognition;Decreased knowledge of use of DME;Decreased safety awareness;Decreased knowledge of precautions;Cardiopulmonary status limiting activity;Decreased skin integrity;Pain       PT Treatment Interventions DME instruction;Gait training;Functional mobility training;Therapeutic activities;Therapeutic exercise;Balance training;Cognitive remediation;Patient/family education    PT Goals (Current goals can be found in the Care Plan section)  Acute Rehab PT Goals PT Goal Formulation: Patient unable to participate in goal setting Time For Goal Achievement: 08/24/19 Potential to Achieve Goals: Fair    Frequency Min 2X/week   Barriers to discharge Decreased caregiver support      Co-evaluation               AM-PAC PT "6 Clicks" Mobility  Outcome Measure Help needed turning from your back to your side while in a flat bed without using bedrails?: A Little Help needed moving from lying on your back to sitting on the side of a flat bed without using  bedrails?: A Lot Help needed moving to and from a bed to a chair (including a wheelchair)?: A Lot Help needed standing up from a chair using your arms (e.g., wheelchair or bedside chair)?: A Lot Help needed to walk in hospital room?: A Lot Help needed climbing 3-5 steps with a railing? : Total 6 Click Score: 12    End of Session   Activity Tolerance: Patient tolerated treatment well Patient left: in chair;with call bell/phone within reach;with chair alarm set(telesitter in room; mittens removed/reapplied to allow for participation with session) Nurse Communication: Mobility status PT Visit Diagnosis: Muscle weakness (generalized) (M62.81);Difficulty in walking, not elsewhere classified (R26.2);Pain    Time: 1031-1057 PT Time Calculation (min) (ACUTE ONLY): 26 min   Charges:   PT Evaluation $PT Eval Moderate Complexity: 1 Mod PT Treatments $Therapeutic Activity: 8-22 mins        Erlin Gardella H. Owens Shark, PT, DPT, NCS 08/10/19, 11:18 AM [phone]

## 2019-08-10 NOTE — Consult Note (Signed)
NAME: Richard Gillespie  DOB: December 26, 1950  MRN: [id number]  Date/Time: 08/10/2019 5:18 PM  REQUESTING PROVIDER: Dr. Sherryll BurgerShah Subjective:  REASON FOR CONSULT: Rule out C. difficile colitis No history available from patient as he is mentally disabled.  Chart reviewed ? Richard PasseMark T Gillespie is a 68 y.o. male with a history of seizures, mental retardation, recurrent pneumonia and prior history of small bowel obstruction was admitted from group home on 08/03/2019 with nausea and vomiting and decreased p.o. intake.  In the emergency department the CT scan showed a high-grade small bowel obstruction.  There was also concern for aspiration pneumonia due to the vomiting and nausea.  He was started on IV vancomycin and Zosyn.  He was seen by surgeon and an NG tube was placed for decompression.  He was taken for exploratory laparotomy and underwent extensive enterolysis, enterorrhaphy on 08/05/2019.  There are multiple adhesions from omentum and peritoneum seen, multiple intestinal adhesions and clear transition point on mid ileum with viable small bowel.  A liter of bilious content also was drained during the surgery. As patient was passing gas surgeon started him on milk of magnesia and Senokot on 08/09/2019. Patient had a bout of loose watery stool the same day.  Patient also had a temperature of 101.  And the nurse called the hospitalist on call and asked him to do a C. difficile test.  I was asked to see the patient for the same. Patient was noted to have a swelling of his right arm and an ultrasound was done to rule out DVT.  It showed ruptured biceps with a hematoma. Past Medical History:  Diagnosis Date  . Mental retardation   . Seizures (HCC)     Past Surgical History:  Procedure Laterality Date  . COLON SURGERY    . LAPAROTOMY N/A 08/05/2019   Procedure: EXPLORATORY LAPAROTOMY;  Surgeon: Carolan Shiverintron-Diaz, Edgardo, MD;  Location: [facility] ORS;  Service: General;  Laterality: N/A;    Social History   Socioeconomic History   . Marital status: Single    Spouse name: Not on file  . Number of children: Not on file  . Years of education: Not on file  . Highest education level: Not on file  Occupational History  . Not on file  Social Needs  . Financial resource strain: Not on file  . Food insecurity    Worry: Not on file    Inability: Not on file  . Transportation needs    Medical: Not on file    Non-medical: Not on file  Tobacco Use  . Smoking status: Never Smoker  . Smokeless tobacco: Never Used  Substance and Sexual Activity  . Alcohol use: No  . Drug use: Never  . Sexual activity: Not on file  Lifestyle  . Physical activity    Days per week: Not on file    Minutes per session: Not on file  . Stress: Not on file  Relationships  . Social Musicianconnections    Talks on phone: Not on file    Gets together: Not on file    Attends religious service: Not on file    Active member of club or organization: Not on file    Attends meetings of clubs or organizations: Not on file    Relationship status: Not on file  . Intimate partner violence    Fear of current or ex partner: Not on file    Emotionally abused: Not on file    Physically abused: Not on file  Forced sexual activity: Not on file  Other Topics Concern  . Not on file  Social History Narrative  . Not on file    History reviewed. No pertinent family history. Allergies  Allergen Reactions  . Zithromax [Azithromycin] Other (See Comments)    unknown   ? Current Facility-Administered Medications  Medication Dose Route Frequency Provider Last Rate Last Dose  . 0.9 %  sodium chloride infusion   Intravenous PRN Herbert Pun, MD 10 mL/hr at 08/09/19 2324 250 mL at 08/09/19 2324  . acetaminophen (TYLENOL) tablet 650 mg  650 mg Oral Q6H PRN Max Sane, MD   650 mg at 08/09/19 2031  . carbamazepine (TEGRETOL) chewable tablet 200 mg  200 mg Oral QID Herbert Pun, MD   200 mg at 08/10/19 1557  . cloNIDine (CATAPRES - Dosed in mg/24  hr) patch 0.1 mg  0.1 mg Transdermal Weekly Herbert Pun, MD   0.1 mg at 08/10/19 1555  . dextrose 5 % and 0.45 % NaCl with KCl 20 mEq/L infusion   Intravenous Continuous Epifanio Lesches, MD 50 mL/hr at 08/10/19 0503    . enoxaparin (LOVENOX) injection 40 mg  40 mg Subcutaneous Q24H Saundra Shelling, MD   40 mg at 08/09/19 2051  . feeding supplement (ENSURE ENLIVE) (ENSURE ENLIVE) liquid 237 mL  237 mL Oral BID BM Herbert Pun, MD   237 mL at 08/10/19 1400  . fluconazole (DIFLUCAN) IVPB 100 mg  100 mg Intravenous Q24H Epifanio Lesches, MD      . haloperidol lactate (HALDOL) injection 4 mg  4 mg Intravenous Q6H PRN Herbert Pun, MD   4 mg at 08/09/19 2050  . influenza vaccine adjuvanted (FLUAD) injection 0.5 mL  0.5 mL Intramuscular Tomorrow-1000 Saundra Shelling, MD   Stopped at 08/08/19 1211  . lactulose (CHRONULAC) 10 GM/15ML solution 10 g  10 g Oral BID PRN Herbert Pun, MD      . lamoTRIgine (LAMICTAL) tablet 75 mg  75 mg Oral BID Herbert Pun, MD   75 mg at 08/10/19 0947  . levETIRAcetam (KEPPRA) IVPB 500 mg/100 mL premix  500 mg Intravenous Q12H Herbert Pun, MD 400 mL/hr at 08/10/19 0624 500 mg at 08/10/19 0624  . loratadine (CLARITIN) tablet 10 mg  10 mg Oral Daily Herbert Pun, MD   10 mg at 08/10/19 0948  . LORazepam (ATIVAN) injection 0.5 mg  0.5 mg Intravenous Q6H PRN Herbert Pun, MD      . medroxyPROGESTERone (PROVERA) tablet 10 mg  10 mg Oral Daily Herbert Pun, MD   10 mg at 08/10/19 0948  . metoprolol tartrate (LOPRESSOR) injection 5 mg  5 mg Intravenous Q6H PRN Herbert Pun, MD   5 mg at 08/04/19 1457  . multivitamin with minerals tablet 1 tablet  1 tablet Oral Daily Herbert Pun, MD   1 tablet at 08/10/19 0948  . pantoprazole (PROTONIX) injection 40 mg  40 mg Intravenous Daily Herbert Pun, MD   40 mg at 08/10/19 0952  . piperacillin-tazobactam (ZOSYN) IVPB 3.375 g   3.375 g Intravenous Q8H Herbert Pun, MD 12.5 mL/hr at 08/10/19 0951 3.375 g at 08/10/19 0951  . traZODone (DESYREL) tablet 300 mg  300 mg Oral QHS Herbert Pun, MD   300 mg at 08/09/19 2314     Abtx:  Anti-infectives (From admission, onward)   Start     Dose/Rate Route Frequency Ordered Stop   08/10/19 1400  fluconazole (DIFLUCAN) IVPB 100 mg     100 mg 50 mL/hr over 60  Minutes Intravenous Every 24 hours 08/10/19 1315     08/05/19 2058  piperacillin-tazobactam (ZOSYN) 3.375 GM/50ML IVPB    Note to Pharmacy: Loretha Stapler   : cabinet override      08/05/19 2058 08/05/19 2131   08/04/19 1400  vancomycin (VANCOCIN) 1,250 mg in sodium chloride 0.9 % 250 mL IVPB  Status:  Discontinued     1,250 mg 166.7 mL/hr over 90 Minutes Intravenous Every 24 hours 08/03/19 1328 08/04/19 1140   08/03/19 2000  piperacillin-tazobactam (ZOSYN) IVPB 3.375 g     3.375 g 12.5 mL/hr over 240 Minutes Intravenous Every 8 hours 08/03/19 1328     08/03/19 1030  vancomycin (VANCOCIN) 1,500 mg in sodium chloride 0.9 % 500 mL IVPB     1,500 mg 250 mL/hr over 120 Minutes Intravenous  Once 08/03/19 1012 08/03/19 1548   08/03/19 1015  piperacillin-tazobactam (ZOSYN) IVPB 3.375 g     3.375 g 12.5 mL/hr over 240 Minutes Intravenous  Once 08/03/19 1012 08/03/19 1619      REVIEW OF SYSTEMS:  Not available Objective:  VITALS:  BP (!) 162/91 (BP Location: Left Arm)   Pulse 93   Temp 98.1 F (36.7 C) (Oral)   Resp (!) 28   Ht 5\' 8"  (1.727 m)   Wt 68 kg   SpO2 94%   BMI 22.81 kg/m  PHYSICAL EXAM: Limited examination General: Awake, nonverbal, microcephaly, asymmetry of the face Head: Microcephaly and asymmetry of the face Eyes: Patient cannot keep his eyes open for checking the pupil ENT Nares normal. No drainage or sinus tenderness. Lips, mucosa, and tongue normal. No Thrush Neck: No JVD or lymphadenopathy. Back: Did not examine his back Lungs: Bilateral air entry Heart: S1-S2 Abdomen:  Soft, laparotomy covered with dressing Condom catheter Extremities: Right arm swollen Skin: No rashes or lesions. Or bruising Lymph: Cervical, supraclavicular normal. Neurologic: Unable to examine in detail Pertinent Labs Lab Results CBC    Component Value Date/Time   WBC 14.8 (H) 08/10/2019 1501   RBC 4.91 08/10/2019 1501   HGB 14.6 08/10/2019 1501   HCT 42.0 08/10/2019 1501   PLT 204 08/10/2019 1501   MCV 85.5 08/10/2019 1501   MCH 29.7 08/10/2019 1501   MCHC 34.8 08/10/2019 1501   RDW 12.2 08/10/2019 1501   LYMPHSABS 0.6 (L) 08/03/2019 0930   MONOABS 1.7 (H) 08/03/2019 0930   EOSABS 0.0 08/03/2019 0930   BASOSABS 0.0 08/03/2019 0930    CMP Latest Ref Rng & Units 08/10/2019 08/08/2019 08/08/2019  Glucose 70 - 99 mg/dL 309(M) 076(K) 088(P)  BUN 8 - 23 mg/dL 14 10 12   Creatinine 0.61 - 1.24 mg/dL 1.03 1.59 4.58  Sodium 135 - 145 mmol/L 138 139 139  Potassium 3.5 - 5.1 mmol/L 3.5 3.6 3.5  Chloride 98 - 111 mmol/L 104 109 110  CO2 22 - 32 mmol/L 23 23 25   Calcium 8.9 - 10.3 mg/dL 5.9(Y) 9.2(K) 7.8(L)  Total Protein 6.5 - 8.1 g/dL - - -  Total Bilirubin 0.3 - 1.2 mg/dL - - -  Alkaline Phos 38 - 126 U/L - - -  AST 15 - 41 U/L - - -  ALT 0 - 44 U/L - - -      Microbiology: Recent Results (from the past 240 hour(s))  Culture, blood (Routine x 2)     Status: None   Collection Time: 08/03/19  9:31 AM   Specimen: BLOOD  Result Value Ref Range Status   Specimen Description BLOOD BLOOD LEFT  HAND  Final   Special Requests   Final    BOTTLES DRAWN AEROBIC AND ANAEROBIC Blood Culture adequate volume   Culture   Final    NO GROWTH 5 DAYS Performed at [facility], [address] [place] [place], [place] [address]    Report Status 08/08/2019 FINAL  Final  SARS Coronavirus 2 [facility] order, Performed in [facility] hospital lab) Nasopharyngeal Nasopharyngeal Swab     Status: None   Collection Time: 08/03/19 11:30 AM   Specimen: Nasopharyngeal Swab  Result Value Ref Range  Status   SARS Coronavirus 2 NEGATIVE NEGATIVE Final    Comment: (NOTE) If result is NEGATIVE SARS-CoV-2 target nucleic acids are NOT DETECTED. The SARS-CoV-2 RNA is generally detectable in upper and lower  respiratory specimens during the acute phase of infection. The lowest  concentration of SARS-CoV-2 viral copies this assay can detect is 250  copies / mL. A negative result does not preclude SARS-CoV-2 infection  and should not be used as the sole basis for treatment or other  patient management decisions.  A negative result may occur with  improper specimen collection / handling, submission of specimen other  than nasopharyngeal swab, presence of viral mutation(s) within the  areas targeted by this assay, and inadequate number of viral copies  (<250 copies / mL). A negative result must be combined with clinical  observations, patient history, and epidemiological information. If result is POSITIVE SARS-CoV-2 target nucleic acids are DETECTED. The SARS-CoV-2 RNA is generally detectable in upper and lower  respiratory specimens dur ing the acute phase of infection.  Positive  results are indicative of active infection with SARS-CoV-2.  Clinical  correlation with patient history and other diagnostic information is  necessary to determine patient infection status.  Positive results do  not rule out bacterial infection or co-infection with other viruses. If result is PRESUMPTIVE POSTIVE SARS-CoV-2 nucleic acids MAY BE PRESENT.   A presumptive positive result was obtained on the submitted specimen  and confirmed on repeat testing.  While 2019 novel coronavirus  (SARS-CoV-2) nucleic acids may be present in the submitted sample  additional confirmatory testing may be necessary for epidemiological  and / or clinical management purposes  to differentiate between  SARS-CoV-2 and other Sarbecovirus currently known to infect humans.  If clinically indicated additional testing with an alternate  test  methodology [phone]) is advised. The SARS-CoV-2 RNA is generally  detectable in upper and lower respiratory sp ecimens during the acute  phase of infection. The expected result is Negative. Fact Sheet for Patients:  BoilerBrush.com.cy Fact Sheet for Healthcare Providers: https://pope.com/ This test is not yet approved or cleared by the [place] FDA and has been authorized for detection and/or diagnosis of SARS-CoV-2 by FDA under an Emergency Use Authorization (EUA).  This EUA will remain in effect (meaning this test can be used) for the duration of the COVID-19 declaration under Section 564(b)(1) of the Act, 21 U.S.C. section 360bbb-3(b)(1), unless the authorization is terminated or revoked sooner. Performed at [facility], [address] [place], [place] [address]   Culture, blood (Routine x 2)     Status: None   Collection Time: 08/03/19  2:04 PM   Specimen: BLOOD  Result Value Ref Range Status   Specimen Description BLOOD BLOOD RIGHT HAND  Final   Special Requests   Final    BOTTLES DRAWN AEROBIC ONLY Blood Culture adequate volume   Culture   Final    NO GROWTH 5 DAYS Performed  at [facility], [address] [place], [place] [address]    Report Status 08/08/2019 FINAL  Final  MRSA PCR Screening     Status: None   Collection Time: 08/03/19  7:44 PM  Result Value Ref Range Status   MRSA by PCR NEGATIVE NEGATIVE Final    Comment:        The GeneXpert MRSA Assay (FDA approved for NASAL specimens only), is one component of a comprehensive MRSA colonization surveillance program. It is not intended to diagnose MRSA infection nor to guide or monitor treatment for MRSA infections. Performed at [facility], [address] [place], [place] [address]     IMAGING RESULTS:  Rotated film Left infrahilar opacity is very likely atelectasis versus infiltrate. I have personally reviewed  the films ? Impression/Recommendation 68 year old male with mental retardation presenting with nausea vomiting and found to have high level intestinal obstruction and underwent laparotomy with release of adhesions.  Diarrhea secondary to starting of milk of magnesia and Senokot.  It was stopped and there is no diarrhea today.  This is not C. difficile and hence would not add recommend testing the stool for C. difficile.  ? ? ? New onset fever.  Due to the hematoma and rupture of the right biceps.  Small bowel obstruction status post surgery.  Aspiration pneumonia .  Patient has been on Zosyn since 08/03/2019 for almost a week.  Is been adequately treated.  As he does not have any respiratory distress recommend stopping Zosyn.  Atelectasis is a concern.  I doubt patient will be able to do incentive spirometry.    Discussed the management with the nurse and the requesting provider and surgeon. ID will sign off call if needed.  Note:  This document was prepared using Dragon voice recognition software and may include unintentional dictation errors.

## 2019-08-10 NOTE — Progress Notes (Signed)
Pt eating small amounts of puddings, ice cream with crushed meds. Needs thickened fluids though.

## 2019-08-10 NOTE — Progress Notes (Addendum)
[employer] [facility] - [place] at [facility]   PATIENT NAME: Richard Gillespie    MR#:  [id number]  DATE OF BIRTH:  11-29-1950  SUBJECTIVE: Foley catheter was taken out yesterday.  No shortness of breath, patient is not eating much, seen by speech therapy found to have thrush.  Patient also had swelling of the right arm for which a weighted ultrasound of the right arm showed biceps tendon rupture.  CHIEF COMPLAINT:   Chief Complaint  Patient presents with  . Emesis  Physical therapy recommends someone from group home to stay with him to see if patient participates better when she is around people that he knows.  REVIEW OF SYSTEMS:    ROS  Developmentally retarded unable to obtain any history  DRUG ALLERGIES:   Allergies  Allergen Reactions  . Zithromax [Azithromycin] Other (See Comments)    unknown    VITALS:  Blood pressure (!) 162/91, pulse 93, temperature 98.1 F (36.7 C), temperature source Oral, resp. rate (!) 28, height 5\' 8"  (1.727 m), weight 68 kg, SpO2 94 %.  PHYSICAL EXAMINATION:   Physical Exam  GENERAL:  68 y.o.-year-old patient lying in the bed with NG tube to suction EYES: Pupils equal, round, reactive to light and accommodation. No scleral icterus. Extraocular muscles intact.  HEENT: Head atraumatic, normocephalic. Oropharynx NG tube noted NECK:  Supple, no jugular venous distention. No thyroid enlargement, no tenderness.  LUNGS: Normal breath sounds bilaterally, no wheezing, rales, rhonchi. No use of accessory muscles of respiration.  CARDIOVASCULAR: S1, S2 normal. No murmurs, rubs, or gallops.  ABDOMEN: Soft, nontender, surgical site no discharge. Bowel sounds decreased. No organomegaly or mass.  EXTREMITIES: No cyanosis, clubbing or edema b/l.    NEUROLOGIC: Developmentally retarded Moves all extremities PSYCHIATRIC: Could not be assessed SKIN: No obvious rash, lesion, or ulcer.   LABORATORY PANEL:   CBC Recent Labs  Lab 08/10/19 1501  WBC  14.8*  HGB 14.6  HCT 42.0  PLT 204   ------------------------------------------------------------------------------------------------------------------ Chemistries  Recent Labs  Lab 08/04/19 0436  08/08/19 1043  NA 149*   < > 139  K 3.3*   < > 3.6  CL 112*   < > 109  CO2 27   < > 23  GLUCOSE 122*   < > 141*  BUN 29*   < > 10  CREATININE 0.88   < > 0.64  CALCIUM 8.8*   < > 8.0*  MG  --   --  2.0  AST 83*  --   --   ALT 56*  --   --   ALKPHOS 46  --   --   BILITOT 0.9  --   --    < > = values in this interval not displayed.   ------------------------------------------------------------------------------------------------------------------  Cardiac Enzymes No results for input(s): TROPONINI in the last 168 hours. ------------------------------------------------------------------------------------------------------------------  RADIOLOGY:  Dg Chest 1 View  Result Date: 08/09/2019 CLINICAL DATA:  New onset cough. EXAM: CHEST  1 VIEW COMPARISON:  08/03/2019 FINDINGS: Kyphotic positioning with rightward patient rotation. Cardiomediastinal anatomy is again displaced into the medial right hemithorax. Nodular opacity is seen in the left infrahilar region. Abdomen CT from 08/03/2019 showed no mass lesion at this location making airspace disease the primary consideration. Interstitial markings are diffusely coarsened with chronic features. Asymmetric elevation left hemidiaphragm again noted. NG tube visualized with tip in the stomach. Apparent regular sign in the left upper quadrant suggest intraperitoneal free air as was noted on yesterday's abdomen x-ray this  patient is status post exploratory laparotomy on 08/05/2019. Bones are diffusely demineralized. Telemetry leads overlie the chest. IMPRESSION: 1. New left infrahilar opacity compatible with atelectasis or infiltrate. 2. Chronic underlying interstitial lung disease. 3. Gaseous bowel distention in the left abdomen with probable component  of intraperitoneal as seen on yesterday's abdomen x-ray. Electronically Signed   By: Kennith Center M.D.   On: 08/09/2019 18:34   US Venous Img Upper Uni Right  Result Date: 08/10/2019 CLINICAL DATA:  68 year old male with right upper extremity edema EXAM: RIGHT UPPER EXTREMITY VENOUS DOPPLER ULTRASOUND TECHNIQUE: Gray-scale sonography with graded compression, as well as color Doppler and duplex ultrasound were performed to evaluate the upper extremity deep venous system from the level of the subclavian vein and including the jugular, axillary, basilic, radial, ulnar and upper cephalic vein. Spectral Doppler was utilized to evaluate flow at rest and with distal augmentation maneuvers. COMPARISON:  None. FINDINGS: Contralateral Subclavian Vein: Respiratory phasicity is normal and symmetric with the symptomatic side. No evidence of thrombus. Normal compressibility. Internal Jugular Vein: No evidence of thrombus. Normal compressibility, respiratory phasicity and response to augmentation. Subclavian Vein: No evidence of thrombus. Normal compressibility, respiratory phasicity and response to augmentation. Axillary Vein: No evidence of thrombus. Normal compressibility, respiratory phasicity and response to augmentation. Cephalic Vein: No evidence of thrombus. Normal compressibility, respiratory phasicity and response to augmentation. Basilic Vein: No evidence of thrombus. Normal compressibility, respiratory phasicity and response to augmentation. Brachial Veins: No evidence of thrombus. Normal compressibility, respiratory phasicity and response to augmentation. Radial Veins: No evidence of thrombus. Normal compressibility, respiratory phasicity and response to augmentation. Ulnar Veins: No evidence of thrombus. Normal compressibility, respiratory phasicity and response to augmentation. Venous Reflux:  None visualized. Other Findings: Extensive edema throughout the subcutaneous fat of the upper arm. Additionally, the  biceps muscle appears retracted and surrounded by complex fluid consistent with hematoma. IMPRESSION: 1. Ultrasound findings are consistent with rupture of biceps tendon with retraction and surrounding hematoma. 2. No evidence of deep venous thrombosis in the right upper extremity. Electronically Signed   By: Malachy Moan M.D.   On: 08/10/2019 14:07     ASSESSMENT AND PLAN:   68 year old male patient with history of mental retardation, seizure disorder, bowel obstruction in the past is a resident of group home currently under hospitalist service  -Small bowel obstruction S/p laparotomy and lysis of adhesions Surgery is following.  Surgery seeing, started on liquid diet but patient is not eating much.  Found to have oral thrush, started on Diflucan.    -Hypernatremia, improved.  Poor p.o. intake restarted IV fluids.   -Acute hypokalemia Improved  -Seizure disorder Continue Keppra IV  -DVT prophylaxis with [place] lovenox daily  -Aspiration pneumonia, repeat chest x-ray is concerning for pneumonia in the left lung, WBC up at 14.8 today, add continue Zosyn, continue full  aspiration precautions, recheck the UA, urine cultures.  Discontinued Foley catheter, patient is able to void well with condom catheter.  Biceps tendon rupture on right side, consult orthopedic.       All the records are reviewed and case discussed with Care Management/Social Worker. Management plans discussed with the patient, family and they are in agreement.  CODE STATUS: DNR  DVT Prophylaxis: SCDs  TOTAL TIME TAKING CARE OF THIS PATIENT: 35 minutes.   POSSIBLE D/C IN  DAYS, DEPENDING ON CLINICAL CONDITION.  Katha Hamming M.D on 08/10/2019 at 3:21 PM  Between 7am to 6pm - Pager - [phone]  After 6pm go to [email or web address] -  password [email or web address] [place] [facility]  Office  [phone]  CC: Primary care physician; Baxter Hire, MD  Note: This dictation was prepared with  Dragon dictation along with smaller phrase technology. Any transcriptional errors that result from this process are unintentional.

## 2019-08-10 NOTE — Progress Notes (Signed)
Aspirated 10cc out of NG tube. Dark green liquid. Then administered meds, broth and some Ensure through NG tube and waited 30 min before removing it. Patient tolerated removal well and was asymptomatic with insertion of meds and liquids.

## 2019-08-10 NOTE — Progress Notes (Signed)
No BM's this shift.

## 2019-08-10 NOTE — Progress Notes (Signed)
[place] Hospital Day(s): 7.   Post op day(s): 5 Days Post-Op.   Interval History: Patient seen and examined.  As per nurse report, the patient had multiple bowel movement yesterday.  He also had one episode of fever last night.  Tylenol was given on he has been controlled since then.  New x-ray showed new infiltrate on the left lung.  The abdomen is soft and depressible, the wounds without drainage or erythema.  Vital signs in last 24 hours: [min-max] current  Temp:  [97.4 F (36.3 C)-101 F (38.3 C)] 97.4 F (36.3 C) (09/14 0413) Pulse Rate:  [86-97] 86 (09/14 0413) Resp:  [20-28] 24 (09/14 0413) BP: (129-146)/(72-81) 129/72 (09/14 0413) SpO2:  [90 %-96 %] 94 % (09/14 0413)     Height: 5\' 8"  (172.7 cm) Weight: 68 kg BMI (Calculated): 22.81   NGT: Has been clamped since yesterday morning.  Patient with multiple bowel movement.  Physical Exam:  Constitutional: alert, cooperative and no distress  Respiratory: breathing non-labored at rest  Cardiovascular: regular rate and sinus rhythm  Gastrointestinal: soft, non-tender, and non-distended  Labs:  CBC Latest Ref Rng & Units 08/08/2019 08/07/2019 08/05/2019  WBC 4.0 - 10.5 K/uL 12.7(H) 13.6(H) 9.7  Hemoglobin 13.0 - 17.0 g/dL 13.1 14.3 16.3  Hematocrit 39.0 - 52.0 % 39.8 44.5 49.6  Platelets 150 - 400 K/uL 166 164 200   CMP Latest Ref Rng & Units 08/08/2019 08/08/2019 08/07/2019  Glucose 70 - 99 mg/dL 141(H) 145(H) 164(H)  BUN 8 - 23 mg/dL 10 12 26(H)  Creatinine 0.61 - 1.24 mg/dL 0.64 0.63 0.78  Sodium 135 - 145 mmol/L 139 139 148(H)  Potassium 3.5 - 5.1 mmol/L 3.6 3.5 4.0  Chloride 98 - 111 mmol/L 109 110 112(H)  CO2 22 - 32 mmol/L 23 25 28   Calcium 8.9 - 10.3 mg/dL 8.0(L) 7.8(L) 7.8(L)  Total Protein 6.5 - 8.1 g/dL - - -  Total Bilirubin 0.3 - 1.2 mg/dL - - -  Alkaline Phos 38 - 126 U/L - - -  AST 15 - 41 U/L - - -  ALT 0 - 44 U/L - - -    Imaging studies: Chest x-ray with opacity in the left lung.  This  could represent an infiltrate versus atelectasis.   Assessment/Plan:  68 y.o.malewith small bowel obstruction5Day Post-Ops/p lysis of adhesions and small bowel repair, complicated by pertinent comorbidities includingsevere developmental delay, aspiration pneumonia and seizure disorder. Patient recovering very slowly.  He started to have bowel movement yesterday.  He has not been eating.  There has been no nausea or vomiting since the NGT was clamped yesterday morning.  Will discontinue NG tube and order full liquids to see if patient will prefer this type of diet and if it was the NG that was and let him eat.  Encouraged the nurse to take the patient out of bed with assistance at the base of the chair.  We will continue with DVT prophylaxis.  We will continue to follow temperature to see if the patient does any other fever.  We will also follow labs this morning.  Depending on the labs and vital signs will decide if patient will need further work-up for fever such as blood cultures or stool cultures.  The fever could come from atelectasis or from pneumonia.  Patient currently on Zosyn for pneumonia.  Will follow closely.  We appreciate hospitalist evaluation and recommendation regarding medical management.  Arnold Long, MD

## 2019-08-11 LAB — CBC
HCT: 42.7 % (ref 39.0–52.0)
Hemoglobin: 14.3 g/dL (ref 13.0–17.0)
MCH: 29.5 pg (ref 26.0–34.0)
MCHC: 33.5 g/dL (ref 30.0–36.0)
MCV: 88 fL (ref 80.0–100.0)
Platelets: 199 10*3/uL (ref 150–400)
RBC: 4.85 MIL/uL (ref 4.22–5.81)
RDW: 12.2 % (ref 11.5–15.5)
WBC: 15.4 10*3/uL — ABNORMAL HIGH (ref 4.0–10.5)
nRBC: 0 % (ref 0.0–0.2)

## 2019-08-11 LAB — BASIC METABOLIC PANEL
Anion gap: 12 (ref 5–15)
BUN: 13 mg/dL (ref 8–23)
CO2: 23 mmol/L (ref 22–32)
Calcium: 8.3 mg/dL — ABNORMAL LOW (ref 8.9–10.3)
Chloride: 104 mmol/L (ref 98–111)
Creatinine, Ser: 0.7 mg/dL (ref 0.61–1.24)
GFR calc Af Amer: >60 mL/min (ref >60)
GFR calc non Af Amer: >60 mL/min (ref >60)
Glucose, Bld: 144 mg/dL — ABNORMAL HIGH (ref 70–99)
Potassium: 3.4 mmol/L — ABNORMAL LOW (ref 3.5–5.1)
Sodium: 139 mmol/L (ref 135–145)

## 2019-08-11 NOTE — TOC Progression Note (Signed)
Transition of Care [facility]) - Progression Note    Patient Details  Name: Richard Gillespie MRN: [id number] Date of Birth: 1951/02/19  Transition of Care [facility]) CM/SW Contact  Beverly Sessions, RN Phone Number: 08/11/2019, 10:17 AM  Clinical Narrative:    Spoke with Elouise Munroe group home owner. He is willing to coordinate with PT for someone from the group home to come to the hospital during a PT session.  That way there is someone the patient is familiar with.  Kristen with PT notified and to call Mr. Robina Ade to coordinate    Expected Discharge Plan: Group Home Barriers to Discharge: Continued Medical Work up  Expected Discharge Plan and Services Expected Discharge Plan: [place] arrangements for the past 2 months: Group Home                                       Social Determinants of Health (SDOH) Interventions    Readmission Risk Interventions No flowsheet data found.

## 2019-08-11 NOTE — Plan of Care (Signed)
  Problem: Nutrition: Goal: Adequate nutrition will be maintained Outcome: Not Progressing  Pt refusing to eat.

## 2019-08-11 NOTE — Progress Notes (Signed)
[employer] [facility] - [place] at [facility]   PATIENT NAME: Richard Gillespie    MR#:  [id number]  DATE OF BIRTH:  11-20-1951  SUBJECTIVE: Patient is alert.  Says that he does not want to eat anymore denies any complaint.  CHIEF COMPLAINT:   Chief Complaint  Patient presents with  . Emesis  Physical therapy recommends someone from group home to stay with him to see if patient participates better when she is around people that he knows.  REVIEW OF SYSTEMS:    ROS  Developmentally retarded unable to obtain any history  DRUG ALLERGIES:   Allergies  Allergen Reactions  . Zithromax [Azithromycin] Other (See Comments)    unknown    VITALS:  Blood pressure (!) 141/77, pulse 80, temperature 98.5 F (36.9 C), temperature source Oral, resp. rate 18, height 5\' 8"  (1.727 m), weight 68 kg, SpO2 95 %.  PHYSICAL EXAMINATION:   Physical Exam  GENERAL:  68 y.o.-year-old patient lying in the bed, not eating much.   EYES: Pupils equal, round, reactive to light  No scleral icterus. Extraocular muscles intact.  HEENT: Head atraumatic, normocephalic. Oropharynx NG tube noted NECK:  Supple, no jugular venous distention. No thyroid enlargement, no tenderness.  LUNGS: Normal breath sounds bilaterally, no wheezing, rales, rhonchi. No use of accessory muscles of respiration.  CARDIOVASCULAR: S1, S2 normal. No murmurs, rubs, or gallops.  ABDOMEN: Soft, nontender, surgical site no discharge. Bowel sounds decreased. No organomegaly or mass.  EXTREMITIES: No cyanosis, clubbing or edema b/l.    NEUROLOGIC: Developmentally retarded Moves all extremities PSYCHIATRIC: Could not be assessed SKIN: No obvious rash, lesion, or ulcer.   LABORATORY PANEL:   CBC Recent Labs  Lab 08/11/19 0523  WBC 15.4*  HGB 14.3  HCT 42.7  PLT 199   ------------------------------------------------------------------------------------------------------------------ Chemistries  Recent Labs  Lab 08/08/19 1043   08/11/19 0523  NA 139   < > 139  K 3.6   < > 3.4*  CL 109   < > 104  CO2 23   < > 23  GLUCOSE 141*   < > 144*  BUN 10   < > 13  CREATININE 0.64   < > 0.70  CALCIUM 8.0*   < > 8.3*  MG 2.0  --   --    < > = values in this interval not displayed.   ------------------------------------------------------------------------------------------------------------------  Cardiac Enzymes No results for input(s): TROPONINI in the last 168 hours. ------------------------------------------------------------------------------------------------------------------  RADIOLOGY:  Dg Chest 1 View  Result Date: 08/09/2019 CLINICAL DATA:  New onset cough. EXAM: CHEST  1 VIEW COMPARISON:  08/03/2019 FINDINGS: Kyphotic positioning with rightward patient rotation. Cardiomediastinal anatomy is again displaced into the medial right hemithorax. Nodular opacity is seen in the left infrahilar region. Abdomen CT from 08/03/2019 showed no mass lesion at this location making airspace disease the primary consideration. Interstitial markings are diffusely coarsened with chronic features. Asymmetric elevation left hemidiaphragm again noted. NG tube visualized with tip in the stomach. Apparent regular sign in the left upper quadrant suggest intraperitoneal free air as was noted on yesterday's abdomen x-ray this patient is status post exploratory laparotomy on 08/05/2019. Bones are diffusely demineralized. Telemetry leads overlie the chest. IMPRESSION: 1. New left infrahilar opacity compatible with atelectasis or infiltrate. 2. Chronic underlying interstitial lung disease. 3. Gaseous bowel distention in the left abdomen with probable component of intraperitoneal as seen on yesterday's abdomen x-ray. Electronically Signed   By: Kennith CenterEric  Mansell M.D.   On: 08/09/2019 18:34  US Venous Img Upper Uni Right  Result Date: 08/10/2019 CLINICAL DATA:  68 year old male with right upper extremity edema EXAM: RIGHT UPPER EXTREMITY VENOUS DOPPLER  ULTRASOUND TECHNIQUE: Gray-scale sonography with graded compression, as well as color Doppler and duplex ultrasound were performed to evaluate the upper extremity deep venous system from the level of the subclavian vein and including the jugular, axillary, basilic, radial, ulnar and upper cephalic vein. Spectral Doppler was utilized to evaluate flow at rest and with distal augmentation maneuvers. COMPARISON:  None. FINDINGS: Contralateral Subclavian Vein: Respiratory phasicity is normal and symmetric with the symptomatic side. No evidence of thrombus. Normal compressibility. Internal Jugular Vein: No evidence of thrombus. Normal compressibility, respiratory phasicity and response to augmentation. Subclavian Vein: No evidence of thrombus. Normal compressibility, respiratory phasicity and response to augmentation. Axillary Vein: No evidence of thrombus. Normal compressibility, respiratory phasicity and response to augmentation. Cephalic Vein: No evidence of thrombus. Normal compressibility, respiratory phasicity and response to augmentation. Basilic Vein: No evidence of thrombus. Normal compressibility, respiratory phasicity and response to augmentation. Brachial Veins: No evidence of thrombus. Normal compressibility, respiratory phasicity and response to augmentation. Radial Veins: No evidence of thrombus. Normal compressibility, respiratory phasicity and response to augmentation. Ulnar Veins: No evidence of thrombus. Normal compressibility, respiratory phasicity and response to augmentation. Venous Reflux:  None visualized. Other Findings: Extensive edema throughout the subcutaneous fat of the upper arm. Additionally, the biceps muscle appears retracted and surrounded by complex fluid consistent with hematoma. IMPRESSION: 1. Ultrasound findings are consistent with rupture of biceps tendon with retraction and surrounding hematoma. 2. No evidence of deep venous thrombosis in the right upper extremity. Electronically  Signed   By: Jacqulynn Cadet M.D.   On: 08/10/2019 14:07     ASSESSMENT AND PLAN:   68 year old male patient with history of mental retardation, seizure disorder, bowel obstruction in the past is a resident of group home currently under hospitalist service  -Small bowel obstruction S/p laparotomy and lysis of adhesions Surgery is following.  Surgery seeing, started on liquid diet but patient is not eating much.  Found to have oral thrush, started on Diflucan.    -Hypernatremia, improved.  Poor p.o. intake restarted IV fluids.   -Acute hypokalemia Improved  -Seizure disorder Continue Keppra IV  -DVT prophylaxis with [place] lovenox daily  -Aspiration pneumonia, repeat chest x-ray is concerning for pneumonia in the left lung, WBC up at 14.8 today, add continue Zosyn, continue full  aspiration precautions, recheck the UA, urine cultures.  Discontinued Foley catheter, patient is able to void well with condom catheter.  Biceps tendon rupture on right side, consult orthopedic.       All the records are reviewed and case discussed with Care Management/Social Worker. Management plans discussed with the patient, family and they are in agreement.  CODE STATUS: DNR  DVT Prophylaxis: SCDs  TOTAL TIME TAKING CARE OF THIS PATIENT: 35 minutes.   POSSIBLE D/C IN  DAYS, DEPENDING ON CLINICAL CONDITION.  Epifanio Lesches M.D on 08/11/2019 at 10:30 AM  Between 7am to 6pm - Pager - [phone]  After 6pm go to [email or web address] - password [email or web address] [place] [facility]  Office  [phone]  CC: Primary care physician; Baxter Hire, MD  Note: This dictation was prepared with Dragon dictation along with smaller phrase technology. Any transcriptional errors that result from this process are unintentional.

## 2019-08-11 NOTE — Progress Notes (Signed)
Nutrition Follow Up Note   DOCUMENTATION CODES:   Not applicable  INTERVENTION:   Ensure Enlive po BID, each supplement provides 350 kcal and 20 grams of protein  Magic cup TID with meals, each supplement provides 290 kcal and 9 grams of protein  MVI daily   Pt at high refeed risk; recommend monitor K, Mg and P labs daily until stable.   NUTRITION DIAGNOSIS:   Inadequate oral intake related to inability to eat as evidenced by NPO status.  GOAL:   Patient will meet greater than or equal to 90% of their needs  -progressing   MONITOR:   PO intake, Supplement acceptance, Labs, Weight trends, Skin, I & O's  ASSESSMENT:   68 year old male with PMHx of developmental delay, seizures, hx bowel obstruction admitted from group home with SBO.   Pt continues to have poor appetite and oral intake. Pt is eating some applesauce, pudding and ice cream with meds. Ensure is being offered but pt is refusing most of it. Pt seen by SLP and recommended for dysphagia 1 diet; pt also found to have thrush. RD will add Magic Cups to meal trays. Recommend consider Megace if pt's appetite does not improve after treatment of thrush. Pt is at high refeed risk; recommend monitor K, Mg and P labs daily until stable. No new weight since admit; will request weekly weights.   Medications reviewed and include: lovenox, MVI, protonix, NaCl w/ 5% dextrose @50ml /hr, zosyn  Labs reviewed: K 3.4(L) Mg 2.0(L)- 9/12 Wbc- 15.4(H)  Diet Order:   Diet Order            Diet full liquid Room service appropriate? Yes; Fluid consistency: Thin  Diet effective now             EDUCATION NEEDS:   No education needs have been identified at this time  Skin:  Skin Assessment: Reviewed RN Assessment  Last BM:  9/14- type 7  Height:   Ht Readings from Last 1 Encounters:  08/03/19 5\' 8"  (1.727 m)   Weight:   Wt Readings from Last 1 Encounters:  08/03/19 68 kg   Ideal Body Weight:  70 kg  BMI:  Body mass  index is 22.81 kg/m.  Estimated Nutritional Needs:   Kcal:  1725-2000  Protein:  80-95 grams  Fluid:  1.7-2 L/day  Koleen Distance MS, RD, LDN Pager #- [phone] Office#- [phone] After Hours Pager: [phone]

## 2019-08-11 NOTE — Progress Notes (Signed)
[place] Hospital Day(s): 8.   Post op day(s): 6 Days Post-Op.   Interval History: Patient seen and examined, no acute events or new complaints overnight.  Nurses denies any issue with the patient since yesterday.  The nurse reported that the patient ate a little bit of the applesauce.  He was evaluated yesterday by speech and swallow service and oral thrush found.  This can explain the decreased appetite.  Patient was started on Diflucan.  Patient also was found with swelling of the right upper extremity.  Ultrasound was done to rule out DVT.  He was found with bicep tendon rupture.  Patient had another bowel movement this morning.  Vital signs in last 24 hours: [min-max] current  Temp:  [98.1 F (36.7 C)-98.5 F (36.9 C)] 98.5 F (36.9 C) (09/15 0437) Pulse Rate:  [80-93] 80 (09/15 0437) Resp:  [18-32] 18 (09/15 0437) BP: (141-162)/(77-100) 141/77 (09/15 0437) SpO2:  [94 %-100 %] 95 % (09/15 0437)     Height: 5\' 8"  (172.7 cm) Weight: 68 kg BMI (Calculated): 22.81   Physical Exam:  Constitutional: alert, cooperative and no distress  Respiratory: breathing non-labored at rest  Cardiovascular: regular rate and sinus rhythm  Gastrointestinal: soft, non-tender, and non-distended.  Wound is dry and clean  Labs:  CBC Latest Ref Rng & Units 08/11/2019 08/10/2019 08/08/2019  WBC 4.0 - 10.5 K/uL 15.4(H) 14.8(H) 12.7(H)  Hemoglobin 13.0 - 17.0 g/dL 14.3 14.6 13.1  Hematocrit 39.0 - 52.0 % 42.7 42.0 39.8  Platelets 150 - 400 K/uL 199 204 166   CMP Latest Ref Rng & Units 08/11/2019 08/10/2019 08/08/2019  Glucose 70 - 99 mg/dL 144(H) 125(H) 141(H)  BUN 8 - 23 mg/dL 13 14 10   Creatinine 0.61 - 1.24 mg/dL 0.70 0.66 0.64  Sodium 135 - 145 mmol/L 139 138 139  Potassium 3.5 - 5.1 mmol/L 3.4(L) 3.5 3.6  Chloride 98 - 111 mmol/L 104 104 109  CO2 22 - 32 mmol/L 23 23 23   Calcium 8.9 - 10.3 mg/dL 8.3(L) 8.6(L) 8.0(L)  Total Protein 6.5 - 8.1 g/dL - - -  Total Bilirubin 0.3 - 1.2  mg/dL - - -  Alkaline Phos 38 - 126 U/L - - -  AST 15 - 41 U/L - - -  ALT 0 - 44 U/L - - -    Imaging studies: No new pertinent imaging studies   Assessment/Plan:  68 y.o.malewith small bowel obstruction6Day Post-Ops/p lysis of adhesions and small bowel repair, complicated by pertinent comorbidities includingsevere developmental delay, aspiration pneumonia and seizure disorder. Patient has not had fever in the last 24 hours.  No tachycardia.  He had another bowel movement.  We will continue with full liquids and the patient started to eat better.  I agree with speech and swallow service that oral thrush may be the reason that the patient does not want to eat.  Patient was started in this again.  We will continue trial of liquid diet that will be softer for the patient.  Will advance if patient started to eat better.  Appreciate hospitalist evaluation and management of medical condition.  Agree with the recommendation of antibiotic therapy on the orthopedic evaluation for the bicep rupture.  Patient not complaining of specific pain but it may be difficult for patient to express his complaints due to the developmental delay.  There was an increasing white blood cell count but at the moment no surgeries found.  Urinalysis is improved compared to the one from  admission.  Blood cultures in process.  Fever and white cell count may be coming from pneumonia with new infiltrate on the left lung.  Currently on antibiotic.  Appreciate ID recommendations.  We will continue with DVT prophylaxis.  We will continue with PUD prophylaxis.   Gae GallopEdgardo Cintrn-Daz, MD

## 2019-08-12 LAB — CBC
HCT: 40.5 % (ref 39.0–52.0)
Hemoglobin: 13.6 g/dL (ref 13.0–17.0)
MCH: 29 pg (ref 26.0–34.0)
MCHC: 33.6 g/dL (ref 30.0–36.0)
MCV: 86.4 fL (ref 80.0–100.0)
Platelets: 224 10*3/uL (ref 150–400)
RBC: 4.69 MIL/uL (ref 4.22–5.81)
RDW: 12.4 % (ref 11.5–15.5)
WBC: 17.5 10*3/uL — ABNORMAL HIGH (ref 4.0–10.5)
nRBC: 0 % (ref 0.0–0.2)

## 2019-08-12 LAB — URINE CULTURE: Culture: NO GROWTH

## 2019-08-12 LAB — BASIC METABOLIC PANEL
Anion gap: 11 (ref 5–15)
BUN: 13 mg/dL (ref 8–23)
CO2: 24 mmol/L (ref 22–32)
Calcium: 8.2 mg/dL — ABNORMAL LOW (ref 8.9–10.3)
Chloride: 102 mmol/L (ref 98–111)
Creatinine, Ser: 0.65 mg/dL (ref 0.61–1.24)
GFR calc Af Amer: >60 mL/min (ref >60)
GFR calc non Af Amer: >60 mL/min (ref >60)
Glucose, Bld: 129 mg/dL — ABNORMAL HIGH (ref 70–99)
Potassium: 3.1 mmol/L — ABNORMAL LOW (ref 3.5–5.1)
Sodium: 137 mmol/L (ref 135–145)

## 2019-08-12 LAB — MAGNESIUM: Magnesium: 2.2 mg/dL (ref 1.7–2.4)

## 2019-08-12 LAB — PHOSPHORUS: Phosphorus: 3.3 mg/dL (ref 2.5–4.6)

## 2019-08-12 LAB — PROCALCITONIN: Procalcitonin: <0.1 ng/mL

## 2019-08-12 MED ORDER — KCL IN DEXTROSE-NACL 40-5-0.45 MEQ/L-%-% IV SOLN
INTRAVENOUS | Status: DC
  Administered 2019-08-12 – 2019-08-15 (×3): via INTRAVENOUS
  Filled 2019-08-12 (×4): qty 1000

## 2019-08-12 NOTE — TOC Progression Note (Addendum)
Transition of Care [facility]) - Progression Note    Patient Details  Name: Richard Gillespie MRN: [id number] Date of Birth: June 30, 1951  [facility] CM/SW Contact  Beverly Sessions, RN Phone Number: 08/12/2019, 3:07 PM  Clinical Narrative:    RNCM spoke with group home owner, Mr Robina Ade in regards to PT recommendation.  Mr Robina Ade request that I call Gena Fray [phone] who is the nurse at the group home.  Mr Robina Ade states that Butch Penny makes the decisions if residents can return.  Voicemail left.  Awaiting return call  Expected Discharge Plan: Group Home Barriers to Discharge: Continued Medical Work up  Expected Discharge Plan and Services Expected Discharge Plan: Group Home       Living arrangements for the past 2 months: Group Home                                       Social Determinants of Health (SDOH) Interventions    Readmission Risk Interventions No flowsheet data found.

## 2019-08-12 NOTE — Consult Note (Signed)
ORTHOPAEDIC CONSULTATION  REQUESTING PHYSICIAN: Carolan Shiver, MD  Chief Complaint:   Right upper arm swelling.  History of Present Illness: Richard Gillespie is a 68 y.o. male with a history of mental retardation and seizures who was admitted 10 days ago ago for a small bowel obstruction.  He was taken to the operating room 1 week ago for surgical correction of this high-grade partial small bowel obstruction.  Apparently, he was noted to have some swelling in the right upper extremity several days ago.  A Doppler ultrasound of the right upper extremity was performed to rule out a DVT.  During the ultrasound, the patient was noted to have a ruptured biceps tendon, prompting orthopedic evaluation.  The patient is unable to provide any history whatsoever, it is unclear as to whether he suffered any injury to the arm.  He does not appear to be in any pain related to the arm.  Past Medical History:  Diagnosis Date  . Mental retardation   . Seizures (HCC)    Past Surgical History:  Procedure Laterality Date  . COLON SURGERY    . LAPAROTOMY N/A 08/05/2019   Procedure: EXPLORATORY LAPAROTOMY;  Surgeon: Carolan Shiver, MD;  Location: [facility] ORS;  Service: General;  Laterality: N/A;   Social History   Socioeconomic History  . Marital status: Single    Spouse name: Not on file  . Number of children: Not on file  . Years of education: Not on file  . Highest education level: Not on file  Occupational History  . Not on file  Social Needs  . Financial resource strain: Not on file  . Food insecurity    Worry: Not on file    Inability: Not on file  . Transportation needs    Medical: Not on file    Non-medical: Not on file  Tobacco Use  . Smoking status: Never Smoker  . Smokeless tobacco: Never Used  Substance and Sexual Activity  . Alcohol use: No  . Drug use: Never  . Sexual activity: Not on file  Lifestyle  .  Physical activity    Days per week: Not on file    Minutes per session: Not on file  . Stress: Not on file  Relationships  . Social Musician on phone: Not on file    Gets together: Not on file    Attends religious service: Not on file    Active member of club or organization: Not on file    Attends meetings of clubs or organizations: Not on file    Relationship status: Not on file  Other Topics Concern  . Not on file  Social History Narrative  . Not on file   History reviewed. No pertinent family history. Allergies  Allergen Reactions  . Zithromax [Azithromycin] Other (See Comments)    unknown   Prior to Admission medications   Medication Sig Start Date End Date Taking? Authorizing Provider  carbamazepine (TEGRETOL XR) 200 MG 12 hr tablet Take 200 mg by mouth 4 (four) times daily.    Yes [provider]  cloNIDine (CATAPRES - DOSED IN MG/24 HR) 0.1 mg/24hr patch Place 0.1 mg onto the skin once a week. 01/30/19 01/30/20 Yes [provider]  lamoTRIgine (LAMICTAL) 150 MG tablet Take 75 mg by mouth 2 (two) times daily.    Yes [provider]  levETIRAcetam (KEPPRA) 500 MG tablet Take 1 tablet (500 mg total) by mouth 2 (two) times daily. 09/24/18  Yes Mayo,  Pete Pelt, MD  loratadine (CLARITIN) 10 MG tablet Take 10 mg by mouth daily.   Yes [provider]  LORazepam (ATIVAN) 0.5 MG tablet Take 0.5 mg by mouth as needed for anxiety (max 4 tablets daily).    Yes [provider]  medroxyPROGESTERone (PROVERA) 10 MG tablet Take 10 mg by mouth daily.   Yes [provider]  Multiple Vitamin (THEREMS) TABS Take 1 tablet by mouth daily.   Yes [provider]  traZODone (DESYREL) 150 MG tablet Take 300 mg by mouth at bedtime.   Yes [provider]  bisacodyl (DULCOLAX) 10 MG suppository Place 10 mg rectally as needed for moderate constipation.    [provider]  feeding supplement, ENSURE ENLIVE, (ENSURE  ENLIVE) LIQD Take 237 mLs by mouth 2 (two) times daily between meals. Patient not taking: Reported on 09/12/2018 01/16/17   Theodoro Grist, MD  lactulose (CHRONULAC) 10 GM/15ML solution Take 10 g by mouth 2 (two) times daily as needed for mild constipation.     [provider]  magnesium citrate SOLN Take 1 Bottle by mouth daily as needed for severe constipation (at least 2 days of no BM).     [provider]  mupirocin nasal ointment (BACTROBAN) 2 % Place 1 application into the nose 2 (two) times daily. Use one-half of tube in each nostril twice daily for five (5) days. After application, press sides of nose together and gently massage.    [provider]  polyethylene glycol (MIRALAX / GLYCOLAX) packet Take 17 g by mouth daily as needed for mild constipation or moderate constipation.     [provider]  senna (SENOKOT) 8.6 MG TABS tablet Take 2 tablets by mouth 2 (two) times daily.     [provider]   US Venous Img Upper Uni Right  Result Date: 08/10/2019 CLINICAL DATA:  68 year old male with right upper extremity edema EXAM: RIGHT UPPER EXTREMITY VENOUS DOPPLER ULTRASOUND TECHNIQUE: Gray-scale sonography with graded compression, as well as color Doppler and duplex ultrasound were performed to evaluate the upper extremity deep venous system from the level of the subclavian vein and including the jugular, axillary, basilic, radial, ulnar and upper cephalic vein. Spectral Doppler was utilized to evaluate flow at rest and with distal augmentation maneuvers. COMPARISON:  None. FINDINGS: Contralateral Subclavian Vein: Respiratory phasicity is normal and symmetric with the symptomatic side. No evidence of thrombus. Normal compressibility. Internal Jugular Vein: No evidence of thrombus. Normal compressibility, respiratory phasicity and response to augmentation. Subclavian Vein: No evidence of thrombus. Normal compressibility, respiratory phasicity and response to  augmentation. Axillary Vein: No evidence of thrombus. Normal compressibility, respiratory phasicity and response to augmentation. Cephalic Vein: No evidence of thrombus. Normal compressibility, respiratory phasicity and response to augmentation. Basilic Vein: No evidence of thrombus. Normal compressibility, respiratory phasicity and response to augmentation. Brachial Veins: No evidence of thrombus. Normal compressibility, respiratory phasicity and response to augmentation. Radial Veins: No evidence of thrombus. Normal compressibility, respiratory phasicity and response to augmentation. Ulnar Veins: No evidence of thrombus. Normal compressibility, respiratory phasicity and response to augmentation. Venous Reflux:  None visualized. Other Findings: Extensive edema throughout the subcutaneous fat of the upper arm. Additionally, the biceps muscle appears retracted and surrounded by complex fluid consistent with hematoma. IMPRESSION: 1. Ultrasound findings are consistent with rupture of biceps tendon with retraction and surrounding hematoma. 2. No evidence of deep venous thrombosis in the right upper extremity. Electronically Signed   By: Dellis Filbert.D.  On: 08/10/2019 14:07    Positive ROS: All other systems have been reviewed and were otherwise negative with the exception of those mentioned in the HPI and as above.  Physical Exam: General:  Alert, no acute distress Psychiatric:  Patient is not competent for consent   Cardiovascular:  No pedal edema Respiratory:  No wheezing, non-labored breathing GI:  Abdomen is soft and non-tender Skin:  No lesions in the area of chief complaint Neurologic:  Sensation intact distally Lymphatic:  No axillary or cervical lymphadenopathy  Orthopedic Exam:  Orthopedic examination is limited to the right upper extremity. The patient is unable to cooperate fully with the examination.  However, no obvious swelling, erythema, ecchymosis, abrasions, or other skin  abnormalities are noted involving the upper arm.  He has does not appear to have any tenderness to palpation in the upper arm.  He is able to flex and extend his elbow without discomfort.  There is no obvious elbow effusion.  The biceps tendon does appear to be palpable distally.  It is difficult to assess whether he has a Popeye deformity.  He appears to be neurovascularly intact in the right upper extremity and hand.  X-rays:  An ultrasound of the right upper extremity is available for review.  By report, the scan suggests a "rupture of biceps tendon with retraction and surrounding hematoma".  However, the report does not differentiate as to whether this was a proximal rupture involving the long head of the biceps tendon, or a distal tendon rupture.  Therefore, I contacted Dr. Allena KatzPatel, the radiologist, who reviewed the study and confirms that it is an upper muscle/tendon injury.  Assessment: Injury of long head of biceps tendon, right shoulder.  Plan: Given the type of injury, the patient's multiple medical conditions, and the relative lack of findings on examination, I feel that this injury can be managed nonsurgically.  He should have no functional limitations once the muscle/tendon unit scars in to the surrounding tissues.  He may use the arm as symptoms permit as no immobilization is necessary.  Thank you for asking me to participate in the care of this most pleasant and unfortunate man.  Please let me know if you need any further input as to his orthopedic care.   Maryagnes AmosJ. Jeffrey Valery Amedee, MD  Beeper #:  [phone]  08/12/2019 10:49 AM

## 2019-08-12 NOTE — Progress Notes (Signed)
Patient overnight had a large orangish large watery bowel movement. Patient this am had a moderate amount of watery yellow tinged vomit.

## 2019-08-12 NOTE — Progress Notes (Signed)
Physical Therapy Treatment Patient Details Name: Richard Gillespie MRN: [id number] DOB: 10-Nov-1951 Today's Date: 08/12/2019    History of Present Illness presented to ER secondary to onset of emesis, decreased PO intake; admitted for management of SBO, s/p ex lap with extensive LOA (9/9).    PT Comments    Extensive encouragement required for participation with this session.  Agreeable to gait efforts with caregiver present (improved effort and participation with home caregiver involved), heavy +2 throughout. Demonstrates very narrowed, often-scissoring, gait pattern with poor balance in all planes. Unable to comprehend or utilize assist device properly.  Unsafe to attempt without +2 at all times. May consider WC level as primary mobility immediately upon discharge for optimal safety/indep and decreased caregiver burden.  If so, able to complete basic transfers (SPT without assist device), mod assist +1.  Will likely require caregiver to manage [facility] propulsion/management.  Continue to recommend transition to STR for continued therapy involvement due to significant deviation from baseline level of ability; however, if group home comfortable providing current level of assist (mod +1 for transfers at [facility] level - OR - mod +2 for ALL gait efforts), continue to expect patient to perform/respond more optimally in familiar, comfortable environment.   Follow Up Recommendations  SNF     Equipment Recommendations       Recommendations for Other Services       Precautions / Restrictions Precautions Precautions: Fall Restrictions Weight Bearing Restrictions: No    Mobility  Bed Mobility Overal bed mobility: Needs Assistance Bed Mobility: Supine to Sit;Sit to Supine     Supine to sit: Mod assist;+2 for physical assistance Sit to supine: Min assist;Mod assist   General bed mobility comments: significant encouragement for OOB activities  Transfers Overall transfer level: Needs  assistance Equipment used: 2 person hand held assist Transfers: Sit to/from Stand Sit to Stand: Mod assist;+2 safety/equipment         General transfer comment: assist for weight shift, lift off and balance; constantly attempting return to bed  Ambulation/Gait Ambulation/Gait assistance: Mod assist;+2 physical assistance Gait Distance (Feet): 75 Feet Assistive device: 2 person hand held assist       General Gait Details: narrowed, near-scissoring, stepping pattern; significant forward trunk flexion; poor balance, all planes. Improved effort and participation with caregiver present (and providing second person assist), but remains unsafe to attempt without +2 at all times.   Stairs             Wheelchair Mobility    Modified Rankin (Stroke Patients Only)       Balance Overall balance assessment: Needs assistance Sitting-balance support: No upper extremity supported;Feet supported Sitting balance-Leahy Scale: Fair Sitting balance - Comments: constant attempts to move outside immediate BOS, return to supine despite cuing/encouragement; absent insight/awareness into limits of stability   Standing balance support: Bilateral upper extremity supported Standing balance-Leahy Scale: Zero Standing balance comment: +2 HHA at all times                            Cognition Arousal/Alertness: Awake/alert Behavior During Therapy: WFL for tasks assessed/performed Overall Cognitive Status: History of cognitive impairments - at baseline                                 General Comments: caregiver from group home present during session      Exercises Other Exercises Other Exercises: Bed/chair transfer (  x3) and sit/stand (x3) with bilat HHA, mod assist +2; poor balance, all planes Other Exercises: Rolling bilat, min/mod assist, for clothing/linen change    General Comments        Pertinent Vitals/Pain Pain Assessment: Faces Faces Pain Scale: No  hurt    Home Living                      Prior Function            PT Goals (current goals can now be found in the care plan section) Acute Rehab PT Goals PT Goal Formulation: Patient unable to participate in goal setting Time For Goal Achievement: 08/24/19 Potential to Achieve Goals: Fair Progress towards PT goals: Progressing toward goals    Frequency    Min 2X/week      PT Plan Current plan remains appropriate    Co-evaluation              AM-PAC PT "6 Clicks" Mobility   Outcome Measure  Help needed turning from your back to your side while in a flat bed without using bedrails?: A Little Help needed moving from lying on your back to sitting on the side of a flat bed without using bedrails?: A Lot Help needed moving to and from a bed to a chair (including a wheelchair)?: A Lot Help needed standing up from a chair using your arms (e.g., wheelchair or bedside chair)?: A Lot Help needed to walk in hospital room?: A Lot Help needed climbing 3-5 steps with a railing? : Total 6 Click Score: 12    End of Session Equipment Utilized During Treatment: Gait belt Activity Tolerance: Patient tolerated treatment well Patient left: in bed;with bed alarm set;with family/visitor present;with call bell/phone within reach(group home caregiver at bedside to encourage PO intake) Nurse Communication: Mobility status PT Visit Diagnosis: Muscle weakness (generalized) (M62.81);Difficulty in walking, not elsewhere classified (R26.2);Pain     Time: [id number] PT Time Calculation (min) (ACUTE ONLY): 28 min  Charges:  $Gait Training: 8-22 mins $Therapeutic Activity: 8-22 mins           Shelvy Perazzo H. Manson PasseyBrown, PT, DPT, NCS 08/12/19, 1:10 PM [phone]

## 2019-08-12 NOTE — Progress Notes (Signed)
[place] Hospital Day(s): 9.   Post op day(s): 7 Days Post-Op.   Interval History: Patient seen and examined.  Patient unable to communicate.  Nurse reports that patient had a large bowel movement yesterday.  Also reported vomiting episode.  Otherwise patient in no distress.  This morning patient verbalized that he is fine.  Alert and cooperative.  No fever in the last 72 hours  Vital signs in last 24 hours: [min-max] current  Temp:  [98.2 F (36.8 C)-98.9 F (37.2 C)] 98.2 F (36.8 C) (09/16 0438) Pulse Rate:  [85-100] 85 (09/16 0438) Resp:  [18-20] 20 (09/16 0438) BP: (118-135)/(65-81) 130/65 (09/16 0438) SpO2:  [93 %-98 %] 98 % (09/16 0438)     Height: 5\' 8"  (172.7 cm) Weight: 68 kg BMI (Calculated): 22.81   Physical Exam:  Constitutional: alert, cooperative and no distress  Respiratory: breathing non-labored at rest  Cardiovascular: regular rate and sinus rhythm  Gastrointestinal: soft, non-tender, and non-distended  Labs:  CBC Latest Ref Rng & Units 08/12/2019 08/11/2019 08/10/2019  WBC 4.0 - 10.5 K/uL 17.5(H) 15.4(H) 14.8(H)  Hemoglobin 13.0 - 17.0 g/dL 13.6 14.3 14.6  Hematocrit 39.0 - 52.0 % 40.5 42.7 42.0  Platelets 150 - 400 K/uL 224 199 204   CMP Latest Ref Rng & Units 08/12/2019 08/11/2019 08/10/2019  Glucose 70 - 99 mg/dL 129(H) 144(H) 125(H)  BUN 8 - 23 mg/dL 13 13 14   Creatinine 0.61 - 1.24 mg/dL 0.65 0.70 0.66  Sodium 135 - 145 mmol/L 137 139 138  Potassium 3.5 - 5.1 mmol/L 3.1(L) 3.4(L) 3.5  Chloride 98 - 111 mmol/L 102 104 104  CO2 22 - 32 mmol/L 24 23 23   Calcium 8.9 - 10.3 mg/dL 8.2(L) 8.3(L) 8.6(L)  Total Protein 6.5 - 8.1 g/dL - - -  Total Bilirubin 0.3 - 1.2 mg/dL - - -  Alkaline Phos 38 - 126 U/L - - -  AST 15 - 41 U/L - - -  ALT 0 - 44 U/L - - -    Imaging studies: No new pertinent imaging studies   Assessment/Plan:  68 y.o.malewith small bowel obstruction7Day Post-Ops/p lysis of adhesions and small bowel repair,  complicated by pertinent comorbidities includingsevere developmental delay, aspiration pneumonia and seizure disorder. Difficult patient to evaluate due to the developmental delay.  Patient does not complain.  Patient does not verbalize he is having any pain, nausea or any other symptoms.  Patient is not coughing.  Patient is not having any more fever.  The white blood cells continue to increase.  I discussed the case with ID service and the assessment is that this might be a stress response from the hematoma of the right bicep tendon.  From ID was to stop antibiotic therapy to decrease the rates of other complications such as C. difficile colitis.  Also recommended to order procalcitonin to try to differentiate if the increasing level cell count is from infection or from systemic response to stress. Patient ate ice cream yesterday but still not eating significant amount of food.  There is no significant electrolyte disturbance.  The wounds are dry and clean without sign of infection.  The abdomen is nondistended.  Patient is not coughing.  Urinalysis, urine culture and blood cultures are negative.  Patient on DVT prophylaxis.  Will follow orthopedic surgery evaluation and recommendations.  We will also follow patient clinically very closely.  Patient on Diflucan due to oral thrush.  Hopefully this is the cause of the patient is  not wanting to eat.  Gae GallopEdgardo Cintrn-Daz, MD

## 2019-08-12 NOTE — Progress Notes (Addendum)
[facility] [place] NAME: Richard Gillespie    MR#:  [id number]  DATE OF BIRTH:  July 28, 1951  SUBJECTIVE: Patient is alert.  Says that he does not want to eat anymore denies any complaint.  Patient patient denies any complaints of pain in right or left shoulder.  Patient sleeps mainly on the right side.  CHIEF COMPLAINT:   Chief Complaint  Patient presents with  . Emesis  Physical therapy recommends someone from group home to stay with him to see if patient participates better when she is around people that he knows.  REVIEW OF SYSTEMS:    ROS  Developmentally retarded unable to obtain any history  DRUG ALLERGIES:   Allergies  Allergen Reactions  . Zithromax [Azithromycin] Other (See Comments)    unknown    VITALS:  Blood pressure 130/65, pulse 85, temperature 98.2 F (36.8 C), temperature source Oral, resp. rate 20, height 5\' 8"  (1.727 m), weight 68 kg, SpO2 98 %.  PHYSICAL EXAMINATION:   Physical Exam  GENERAL:  68 y.o.-year-old patient lying in the bed, not eating much.   EYES: Pupils equal, round, reactive to light  No scleral icterus.  HEENT: Head atraumatic, normocephalic. Oropharynx NG tube noted NECK:  Supple, no jugular venous distention. No thyroid enlargement, no tenderness.  LUNGS: Normal breath sounds bilaterally, no wheezing, rales, rhonchi. No use of accessory muscles of respiration.  CARDIOVASCULAR: S1, S2 normal. No murmurs, rubs, or gallops.  ABDOMEN: Soft, nontender, surgical site no discharge. Bowel sounds decreased. No organomegaly or mass.  EXTREMITIES: No cyanosis, clubbing or edema b/l.    NEUROLOGIC: Developmentally retarded Moves all extremities PSYCHIATRIC: Could not be assessed SKIN: No obvious rash, lesion, or ulcer.   LABORATORY PANEL:   CBC Recent Labs  Lab 08/12/19 0410  WBC 17.5*  HGB 13.6  HCT 40.5  PLT 224    ------------------------------------------------------------------------------------------------------------------ Chemistries  Recent Labs  Lab 08/12/19 0410  NA 137  K 3.1*  CL 102  CO2 24  GLUCOSE 129*  BUN 13  CREATININE 0.65  CALCIUM 8.2*  MG 2.2   ------------------------------------------------------------------------------------------------------------------  Cardiac Enzymes No results for input(s): TROPONINI in the last 168 hours. ------------------------------------------------------------------------------------------------------------------  RADIOLOGY:  US Venous Img Upper Uni Right  Result Date: 08/10/2019 CLINICAL DATA:  68 year old male with right upper extremity edema EXAM: RIGHT UPPER EXTREMITY VENOUS DOPPLER ULTRASOUND TECHNIQUE: Gray-scale sonography with graded compression, as well as color Doppler and duplex ultrasound were performed to evaluate the upper extremity deep venous system from the level of the subclavian vein and including the jugular, axillary, basilic, radial, ulnar and upper cephalic vein. Spectral Doppler was utilized to evaluate flow at rest and with distal augmentation maneuvers. COMPARISON:  None. FINDINGS: Contralateral Subclavian Vein: Respiratory phasicity is normal and symmetric with the symptomatic side. No evidence of thrombus. Normal compressibility. Internal Jugular Vein: No evidence of thrombus. Normal compressibility, respiratory phasicity and response to augmentation. Subclavian Vein: No evidence of thrombus. Normal compressibility, respiratory phasicity and response to augmentation. Axillary Vein: No evidence of thrombus. Normal compressibility, respiratory phasicity and response to augmentation. Cephalic Vein: No evidence of thrombus. Normal compressibility, respiratory phasicity and response to augmentation. Basilic Vein: No evidence of thrombus. Normal compressibility, respiratory phasicity and response to augmentation. Brachial  Veins: No evidence of thrombus. Normal compressibility, respiratory phasicity and response to augmentation. Radial Veins: No evidence of thrombus. Normal compressibility, respiratory phasicity and response to augmentation. Ulnar Veins: No evidence of thrombus. Normal compressibility, respiratory phasicity and  response to augmentation. Venous Reflux:  None visualized. Other Findings: Extensive edema throughout the subcutaneous fat of the upper arm. Additionally, the biceps muscle appears retracted and surrounded by complex fluid consistent with hematoma. IMPRESSION: 1. Ultrasound findings are consistent with rupture of biceps tendon with retraction and surrounding hematoma. 2. No evidence of deep venous thrombosis in the right upper extremity. Electronically Signed   By: Malachy MoanHeath  McCullough M.D.   On: 08/10/2019 14:07     ASSESSMENT AND PLAN:   68 year old male patient with history of mental retardation, seizure disorder, bowel obstruction in the past is a resident of group home currently under hospitalist service  -Small bowel obstruction S/p laparotomy and lysis of adhesions Surgery is following.  Surgery seeing, started on liquid diet but patient is not eating much.  Found to have oral thrush, started on Diflucan.    -Hypernatremia, improved.  Poor p.o. intake restarted IV fluids.   -Acute hypokalemia Improved  -Seizure disorder Continue Keppra, Lamictal, Tegretol.  -DVT prophylaxis with [place] lovenox daily  -Aspiration pneumonia, ; treated with Zosyn, ID does not recommend any further antibiotics, procalcitonin is less than 0.1 indicating there is no active infection.  Hold off on further antibiotics.  Leukocytosis can be stress-induced, has biceps hematoma on the right side but patient noted to be sleeping on right side,.  And does not appear to be in any pain.  Discussed this with Dr.  Joice LoftsPoggi from orthopedic who is coming to see the patient.  Discontinued Foley catheter, patient is able  to void well with condom catheter.  Biceps tendon rupture on right side, consult orthopedic.  Spoke with orthopedic Dr Joice LoftsPoggi  today  Essential hypertension,, patient is on clonidine patch continue that.  Hypokalemia, replace potassium.   All the records are reviewed and case discussed with Care Management/Social Worker. Management plans discussed with the patient, family and they are in agreement.  CODE STATUS: DNR  DVT Prophylaxis: SCDs  TOTAL TIME TAKING CARE OF THIS PATIENT: 35 minutes.   POSSIBLE D/C IN  DAYS, DEPENDING ON CLINICAL CONDITION.  Katha HammingSnehalatha Roman Dubuc M.D on 08/12/2019 at 12:35 PM  Between 7am to 6pm - Pager - [phone]  After 6pm go to [email or web address] - password [email or web address] [facility]  [facility] [place] [facility]  Office  [phone]  CC: Primary care physician; Gracelyn NurseJohnston, John D, MD  Note: This dictation was prepared with Dragon dictation along with smaller phrase technology. Any transcriptional errors that result from this process are unintentional.

## 2019-08-12 NOTE — Progress Notes (Addendum)
Speech Therapy note: reviewed chart notes; MD notes. Pt was admitted w/ a high-grade small bowel obstruction and also diagnosed with sepsis secondary to aspiration pneumonia from the nausea and vomiting. Pt is 7 days post-op w/ Surgery following. Pt remains on a Full Liquid diet d/t abdominal issues including report of "large orangish watery bowel movement. Patient this am had a moderate amount of watery yellow tinged vomit." by NSG this morning.  During BSE, pt was found to have [place]; IV Diflucan is ongoing now. Pt continues to refuse to eat much po per NSG/MD notes often stating "No" to things offered; he did eat ice cream yesterday per report. Pt has otherwise verbalized he is "fine" to MD. No decline in CXR presentation to indicate infiltrates/aspiration of po's. At baseline, pt is at risk for aspiration of REFLUX material d/t Vomiting. Currently, pt appears to be tolerating sips/bites of po's per MD/NSG notes.  Surgery is following pt's ability to upgrade diet to foods. RECOMMEND returning to pt's BASELINE diet recommended previously when Surgery feels it's suitable for pt from a abdominal standpoint: a MINCED diet (dysphagia level 2) w/ thin liquids. Recommend aspiration and REFLUX precautions; assistance at meals. Recommend ongoing oral care and monitoring of Thrush. Recommend ongoing f/u by Dietician. MD to reconsult ST services if any decline in swallowing status is noted during admission. Precautions posted in room. Thank you.    Orinda Kenner, [place], CCC-SLP

## 2019-08-12 NOTE — Progress Notes (Signed)
Pt drank an ensure and ate a whole cup of applesauce which is an improvement.

## 2019-08-13 LAB — PROCALCITONIN: Procalcitonin: <0.1 ng/mL

## 2019-08-13 MED ORDER — FLUCONAZOLE 100 MG PO TABS
100.0000 mg | ORAL_TABLET | Freq: Every day | ORAL | Status: DC
  Administered 2019-08-13 – 2019-08-17 (×5): 100 mg via ORAL
  Filled 2019-08-13 (×5): qty 1

## 2019-08-13 MED ORDER — MEGESTROL ACETATE 400 MG/10ML PO SUSP
200.0000 mg | Freq: Every day | ORAL | Status: DC
  Administered 2019-08-13 – 2019-08-17 (×5): 200 mg via ORAL
  Filled 2019-08-13 (×5): qty 10

## 2019-08-13 MED ORDER — PANTOPRAZOLE SODIUM 40 MG PO TBEC
40.0000 mg | DELAYED_RELEASE_TABLET | Freq: Every day | ORAL | Status: DC
  Administered 2019-08-14 – 2019-08-17 (×4): 40 mg via ORAL
  Filled 2019-08-13 (×4): qty 1

## 2019-08-13 MED ORDER — PANTOPRAZOLE SODIUM 40 MG PO TBEC: 40.0000 mg | DELAYED_RELEASE_TABLET | Freq: Every day | ORAL | Status: DC

## 2019-08-13 MED ORDER — LEVETIRACETAM 500 MG PO TABS
500.0000 mg | ORAL_TABLET | Freq: Two times a day (BID) | ORAL | Status: DC
  Administered 2019-08-13 – 2019-08-17 (×8): 500 mg via ORAL
  Filled 2019-08-13 (×8): qty 1

## 2019-08-13 NOTE — Progress Notes (Signed)
[facility] - [place] [facility]   PATIENT NAME: Richard Gillespie    MR#:  [id number]  DATE OF BIRTH:  1951-03-03  SUBJECTIVE: Still has poor p.o. intake.  Seen by orthopedic regarding right biceps hematoma no further treatment is needed as patient is not symptomatic.  No fever.  Seen by speech therapy, started on dysphagia 2 diet, hopefully patient can get someone from  group home to help him with feeding.  CHIEF COMPLAINT:   Chief Complaint  Patient presents with  . Emesis  Patient can go back to group home  when ready as per case management note REVIEW OF SYSTEMS:    ROS  Developmentally retarded unable to obtain any history  DRUG ALLERGIES:   Allergies  Allergen Reactions  . Zithromax [Azithromycin] Other (See Comments)    unknown    VITALS:  Blood pressure 130/76, pulse 75, temperature 98.5 F (36.9 C), temperature source Oral, resp. rate 16, height 5\' 8"  (1.727 m), weight 68 kg, SpO2 95 %.  PHYSICAL EXAMINATION:   Physical Exam  GENERAL:  68 y.o.-year-old patient lying in the bed, not eating much.   EYES: Pupils equal, round, reactive to light  No scleral icterus.  HEENT: Head atraumatic, normocephalic. Oropharynx NG tube noted NECK:  Supple, no jugular venous distention. No thyroid enlargement, no tenderness.  LUNGS: Normal breath sounds bilaterally, no wheezing, rales, rhonchi. No use of accessory muscles of respiration.  CARDIOVASCULAR: S1, S2 normal. No murmurs, rubs, or gallops.  ABDOMEN: Soft, nontender, surgical site no discharge. Bowel sounds decreased. No organomegaly or mass.  EXTREMITIES: No cyanosis, clubbing or edema b/l.    NEUROLOGIC: Developmentally retarded Moves all extremities PSYCHIATRIC: Could not be assessed SKIN: No obvious rash, lesion, or ulcer.   LABORATORY PANEL:   CBC Recent Labs  Lab 08/12/19 0410  WBC 17.5*  HGB 13.6  HCT 40.5  PLT 224    ------------------------------------------------------------------------------------------------------------------ Chemistries  Recent Labs  Lab 08/12/19 0410  NA 137  K 3.1*  CL 102  CO2 24  GLUCOSE 129*  BUN 13  CREATININE 0.65  CALCIUM 8.2*  MG 2.2   ------------------------------------------------------------------------------------------------------------------  Cardiac Enzymes No results for input(s): TROPONINI in the last 168 hours. ------------------------------------------------------------------------------------------------------------------  RADIOLOGY:  No results found.   ASSESSMENT AND PLAN:   68 year old male patient with history of mental retardation, seizure disorder, bowel obstruction in the past is a resident of group home currently under hospitalist service  -Small bowel obstruction S/p laparotomy and lysis of adhesions Surgery is following.  Surgery seeing, started on liquid diet but patient is not eating much.  Found to have oral thrush, started on Diflucan. Elevated white count but procalcitonin is less than 0.1, ID recommends no further work-up, discontinue the IV Zosyn as patient received several days of Zosyn.    Poor p.o. intake .  Continue IV fluids. -Acute hypokalemia, potassium trended down from 3.4-3.1, on labs yesterday, continue to replace Improved  -Seizure disorder Continue Keppra, Lamictal, Tegretol.  -DVT prophylaxis with [place] lovenox daily  -Aspiration pneumonia, ; treated with Zosyn, ID does not recommend any further antibiotics, procalcitonin is less than 0.1 indicating there is no active infection.  Hold off on further antibiotics.  Seen by speech therapy, started on dysphagia 2 diet, patient continues to have poor p.o. intake, staff from group home to come and feed him if it does make any difference   Leukocytosis can be stress-induced, has biceps hematoma on the right side but patient noted to be sleeping  on right side,.  And  does not appear to be in any pain seen by orthopedic, no further intervention is recommended.  Follow-up with orthopedic as an outpatient   discontinued Foley catheter, patient is able to void well with condom catheter.  Biceps tendon rupture on right side, consult orthopedic.  Spoke with orthopedic Dr Roland Rack  today  Essential hypertension,, patient is on clonidine patch continue that.  Hypokalemia, replace potassium.   All the records are reviewed and case discussed with Care Management/Social Worker. Management plans discussed with the patient, family and they are in agreement.  CODE STATUS: DNR  DVT Prophylaxis: SCDs  TOTAL TIME TAKING CARE OF THIS PATIENT: 35 minutes.   POSSIBLE D/C IN  DAYS, DEPENDING ON CLINICAL CONDITION.  Richard Gillespie M.D on 08/13/2019 at 1:19 PM  Between 7am to 6pm - Pager - [phone]  After 6pm go to [email or web address] - password [email or web address] [place] [facility]  Office  [phone]  CC: Primary care physician; Richard Hire, MD  Note: This dictation was prepared with Dragon dictation along with smaller phrase technology. Any transcriptional errors that result from this process are unintentional.

## 2019-08-13 NOTE — Progress Notes (Signed)
PHARMACIST - PHYSICIAN COMMUNICATION DR:   Windell Moment CONCERNING: Antibiotic IV to Oral Route Change Policy  RECOMMENDATION: This patient is receiving fluconazole by the intravenous route.  Based on criteria approved by the Pharmacy and Therapeutics Committee, the antibiotic(s) is/are being converted to the equivalent oral dose form(s).   DESCRIPTION: These criteria include:  Patient being treated for a respiratory tract infection, urinary tract infection, cellulitis or clostridium difficile associated diarrhea if on metronidazole  The patient is not neutropenic and does not exhibit a GI malabsorption state  The patient is eating (either orally or via tube) and/or has been taking other orally administered medications for a least 24 hours  The patient is improving clinically and has a Tmax < 100.5  If you have questions about this conversion, please contact the [place], PharmD, BCPS Clinical Pharmacist 08/13/2019 1:11 PM

## 2019-08-13 NOTE — Progress Notes (Signed)
Patient has had several mushy bowel movements throughout the night.  Will continue to monitor.  Christene Slates  08/13/2019  1:44 AM

## 2019-08-13 NOTE — Progress Notes (Signed)
PHARMACIST - PHYSICIAN COMMUNICATION  DR:   Windell Moment  CONCERNING: IV to Oral Route Change Policy  RECOMMENDATION: This patient is receiving protonix/keppra by the intravenous route.  Based on criteria approved by the Pharmacy and Therapeutics Committee, the intravenous medication(s) is/are being converted to the equivalent oral dose form(s).   DESCRIPTION: These criteria include:  The patient is eating (either orally or via tube) and/or has been taking other orally administered medications for a least 24 hours  The patient has no evidence of active gastrointestinal bleeding or impaired GI absorption (gastrectomy, short bowel, patient on TNA or NPO).  If you have questions about this conversion, please contact the Pharmacy Department  []   [phone] )  [facility] [x]   [phone] )  [facility] []   [phone] )  [facility] []   [phone] )  [facility] []   [phone] )  [place], PharmD, BCPS Clinical Pharmacist 08/13/2019 1:16 PM

## 2019-08-13 NOTE — TOC Progression Note (Signed)
Transition of Care [facility]) - Progression Note    Patient Details  Name: Richard Gillespie MRN: [id number] Date of Birth: 12-Jan-1951  Transition of Care [facility]) CM/SW [place], LCSW Phone Number: 08/13/2019, 12:58 PM  Clinical Narrative: Spoke with Gena Fray, RN at [facility]. After review of how he is doing at this time, she feels that they will be able to take him back. Discussed poor PO intake right now per RN in morning rounds meeting. She is willing to have their staff members come here to feed him to see if that would help. CSW sent her phone number to his RN so they could coordinate that.    Expected Discharge Plan: Group Home Barriers to Discharge: Continued Medical Work up  Expected Discharge Plan and Services Expected Discharge Plan: Group Home       Living arrangements for the past 2 months: Group Home                                       Social Determinants of Health (SDOH) Interventions    Readmission Risk Interventions No flowsheet data found.

## 2019-08-13 NOTE — Progress Notes (Signed)
[place] Hospital Day(s): 10.   Post op day(s): 8 Days Post-Op.   Interval History: Patient seen and examined, no acute events or new complaints overnight.  Patient unable to communicate and express she is complaints.  He does express that he is okay.  He refers that he wants to sleep.  As per discussion with the nurse, there has been no further nausea or vomiting.  There has been no more fever.  There is no tachycardia.  Last bowel movement was yesterday night.  Vital signs in last 24 hours: [min-max] current  Temp:  [98.2 F (36.8 C)-98.7 F (37.1 C)] 98.5 F (36.9 C) (09/17 1150) Pulse Rate:  [75-81] 75 (09/17 1150) Resp:  [16-20] 16 (09/17 1150) BP: (129-135)/(71-83) 130/76 (09/17 1150) SpO2:  [95 %-100 %] 95 % (09/17 1150)     Height: 5\' 8"  (172.7 cm) Weight: 68 kg BMI (Calculated): 22.81   Physical Exam:  Constitutional: alert, cooperative and no distress  Respiratory: breathing non-labored at rest  Cardiovascular: regular rate and sinus rhythm  Gastrointestinal: soft, non-tender, and non-distended  Labs:  CBC Latest Ref Rng & Units 08/12/2019 08/11/2019 08/10/2019  WBC 4.0 - 10.5 K/uL 17.5(H) 15.4(H) 14.8(H)  Hemoglobin 13.0 - 17.0 g/dL 13.6 14.3 14.6  Hematocrit 39.0 - 52.0 % 40.5 42.7 42.0  Platelets 150 - 400 K/uL 224 199 204   CMP Latest Ref Rng & Units 08/12/2019 08/11/2019 08/10/2019  Glucose 70 - 99 mg/dL 129(H) 144(H) 125(H)  BUN 8 - 23 mg/dL 13 13 14   Creatinine 0.61 - 1.24 mg/dL 0.65 0.70 0.66  Sodium 135 - 145 mmol/L 137 139 138  Potassium 3.5 - 5.1 mmol/L 3.1(L) 3.4(L) 3.5  Chloride 98 - 111 mmol/L 102 104 104  CO2 22 - 32 mmol/L 24 23 23   Calcium 8.9 - 10.3 mg/dL 8.2(L) 8.3(L) 8.6(L)  Total Protein 6.5 - 8.1 g/dL - - -  Total Bilirubin 0.3 - 1.2 mg/dL - - -  Alkaline Phos 38 - 126 U/L - - -  AST 15 - 41 U/L - - -  ALT 0 - 44 U/L - - -    Imaging studies: No new pertinent imaging studies   Assessment/Plan:  68 y.o.malewith small  bowel obstruction8Day Post-Ops/p lysis of adhesions and small bowel repair, complicated by pertinent comorbidities includingsevere developmental delay, aspiration pneumonia and seizure disorder. Today patient without vomiting.  Still not eating enough.  My acid was order and dysphagia diet was advanced to see if this helps patient to eat better.  We will continue trying to communicate with group home to see if someone can help to see if patient with someone that he knows.  There has been no fever or tachycardia.  We will continue follow-up labs and electrolytes.  We will continue with current management.  We will consult physical therapy for evaluation and management.  We will continue with DVT prophylaxis.  Arnold Long, MD

## 2019-08-14 LAB — CBC
HCT: 37 % — ABNORMAL LOW (ref 39.0–52.0)
Hemoglobin: 12.6 g/dL — ABNORMAL LOW (ref 13.0–17.0)
MCH: 29.6 pg (ref 26.0–34.0)
MCHC: 34.1 g/dL (ref 30.0–36.0)
MCV: 87.1 fL (ref 80.0–100.0)
Platelets: 215 10*3/uL (ref 150–400)
RBC: 4.25 MIL/uL (ref 4.22–5.81)
RDW: 12.4 % (ref 11.5–15.5)
WBC: 8.3 10*3/uL (ref 4.0–10.5)
nRBC: 0 % (ref 0.0–0.2)

## 2019-08-14 LAB — BASIC METABOLIC PANEL
Anion gap: 8 (ref 5–15)
BUN: 10 mg/dL (ref 8–23)
CO2: 25 mmol/L (ref 22–32)
Calcium: 8.4 mg/dL — ABNORMAL LOW (ref 8.9–10.3)
Chloride: 107 mmol/L (ref 98–111)
Creatinine, Ser: 0.66 mg/dL (ref 0.61–1.24)
GFR calc Af Amer: >60 mL/min (ref >60)
GFR calc non Af Amer: >60 mL/min (ref >60)
Glucose, Bld: 124 mg/dL — ABNORMAL HIGH (ref 70–99)
Potassium: 3.8 mmol/L (ref 3.5–5.1)
Sodium: 140 mmol/L (ref 135–145)

## 2019-08-14 LAB — MAGNESIUM: Magnesium: 2.1 mg/dL (ref 1.7–2.4)

## 2019-08-14 LAB — PHOSPHORUS: Phosphorus: 3.4 mg/dL (ref 2.5–4.6)

## 2019-08-14 NOTE — Progress Notes (Signed)
The patient drank 3/4 of a bottle of ensure for breakfast. For lunch the patient had one magic cup, 1 cup of puree fruit (berries), 1/2 of an ensure container. No falls. The patient was gotten up to the chair with the NA and nurse yet only lasted 3 minutes in the chair and started to get up to get back in the bed. The patient refused to stay in the chair.

## 2019-08-14 NOTE — Progress Notes (Signed)
[facility] - [place] [facility]   PATIENT NAME: Richard DameMark Wigle    MR#:  [id number]  DATE OF BIRTH:  02/05/1951  SUBJECTIVE: More awake, alert today, still refusing to eat,  CHIEF COMPLAINT:   Chief Complaint  Patient presents with  . Emesis  Patient can go back to group home  when ready as per case management note REVIEW OF SYSTEMS:    ROS  Developmentally retarded unable to obtain any history  DRUG ALLERGIES:   Allergies  Allergen Reactions  . Zithromax [Azithromycin] Other (See Comments)    unknown    VITALS:  Blood pressure 136/78, pulse 69, temperature 97.7 F (36.5 C), temperature source Oral, resp. rate 20, height 5\' 8"  (1.727 m), weight 68 kg, SpO2 98 %.  PHYSICAL EXAMINATION:   Physical Exam  GENERAL:  68 y.o.-year-old patient lying in the bed, not eating much.   EYES: Pupils equal, round, reactive to light  No scleral icterus.  HEENT: Head atraumatic, normocephalic. Oropharynx NG tube noted NECK:  Supple, no jugular venous distention. No thyroid enlargement, no tenderness.  LUNGS: Normal breath sounds bilaterally, no wheezing, rales, rhonchi. No use of accessory muscles of respiration.  CARDIOVASCULAR: S1, S2 normal. No murmurs, rubs, or gallops.  ABDOMEN: Soft, nontender, surgical site no discharge. Bowel sounds decreased. No organomegaly or mass.  Patient has midline abdominal incision which is well-healed. EXTREMITIES: No cyanosis, clubbing or edema b/l.    NEUROLOGIC: Developmentally retarded Moves all extremities PSYCHIATRIC: Could not be assessed SKIN: No obvious rash, lesion, or ulcer.   LABORATORY PANEL:   CBC Recent Labs  Lab 08/14/19 0335  WBC 8.3  HGB 12.6*  HCT 37.0*  PLT 215   ------------------------------------------------------------------------------------------------------------------ Chemistries  Recent Labs  Lab 08/14/19 0335  NA 140  K 3.8  CL 107  CO2 25  GLUCOSE 124*  BUN 10  CREATININE 0.66   CALCIUM 8.4*  MG 2.1   ------------------------------------------------------------------------------------------------------------------  Cardiac Enzymes No results for input(s): TROPONINI in the last 168 hours. ------------------------------------------------------------------------------------------------------------------  RADIOLOGY:  No results found.   ASSESSMENT AND PLAN:   68 year old male patient with history of mental retardation, seizure disorder, bowel obstruction in the past is a resident of group home currently under hospitalist service  -Small bowel obstruction S/p laparotomy and lysis of adhesions Surgery is following.  Surgery seeing, started on liquid diet but patient is not eating much.    Poor p.o. intake .  Continue IV fluids.. For oral thrush patient is on Diflucan.   -Acute hypokalemia, proved. -Seizure disorder Continue Keppra, Lamictal, Tegretol.  -DVT prophylaxis with [place] lovenox daily  -Aspiration pneumonia, ; treated with Zosyn, ID.  WBC normalized.  Leukocytosis can be stress-induced, improved, right biceps hematoma ,orthopedic did not recommend any treatment, patient is not complaining of pain, also sleeping on the right side t   discontinued Foley catheter, patient is able to void well with condom catheter.   Essential hypertension,, patient is on clonidine patch continue that.  Hypokalemia, replace potassium.  We will sign off recall as needed   All the records are reviewed and case discussed with Care Management/Social Worker. Management plans discussed with the patient, family and they are in agreement.  CODE STATUS: DNR  DVT Prophylaxis: SCDs  TOTAL TIME TAKING CARE OF THIS PATIENT: 35 minutes.   POSSIBLE D/C IN  DAYS, DEPENDING ON CLINICAL CONDITION.  Katha HammingSnehalatha Sehaj Kolden M.D on 08/14/2019 at 1:05 PM  Between 7am to 6pm - Pager - [phone]  After 6pm go  to [email or web address] - password [email or web address] [place] [facility]   Office  [phone]  CC: Primary care physician; Baxter Hire, MD  Note: This dictation was prepared with Dragon dictation along with smaller phrase technology. Any transcriptional errors that result from this process are unintentional.

## 2019-08-14 NOTE — Progress Notes (Signed)
[place] Hospital Day(s): 11.   Post op day(s): 9 Days Post-Op.   Interval History: Patient seen and examined, no acute events or new complaints overnight.  Patient today more active and strong but still not eating significant amount of fluid.  He ate some fluids and is drinking Ensure.  Nurse reported that he had a bowel movement today.   Vital signs in last 24 hours: [min-max] current  Temp:  [97.7 F (36.5 C)-97.9 F (36.6 C)] 97.9 F (36.6 C) (09/18 1600) Pulse Rate:  [69-79] 77 (09/18 1600) Resp:  [17-20] 17 (09/18 1600) BP: (136-146)/(78-86) 146/86 (09/18 1600) SpO2:  [94 %-98 %] 97 % (09/18 1600)     Height: 5\' 8"  (172.7 cm) Weight: 68 kg BMI (Calculated): 22.81    Physical Exam:  Constitutional: alert, cooperative and no distress  Respiratory: breathing non-labored at rest  Cardiovascular: regular rate and sinus rhythm  Gastrointestinal: soft, non-tender, and non-distended.  Wound is dry and clean  Labs:  CBC Latest Ref Rng & Units 08/14/2019 08/12/2019 08/11/2019  WBC 4.0 - 10.5 K/uL 8.3 17.5(H) 15.4(H)  Hemoglobin 13.0 - 17.0 g/dL 12.6(L) 13.6 14.3  Hematocrit 39.0 - 52.0 % 37.0(L) 40.5 42.7  Platelets 150 - 400 K/uL 215 224 199   CMP Latest Ref Rng & Units 08/14/2019 08/12/2019 08/11/2019  Glucose 70 - 99 mg/dL 124(H) 129(H) 144(H)  BUN 8 - 23 mg/dL 10 13 13   Creatinine 0.61 - 1.24 mg/dL 0.66 0.65 0.70  Sodium 135 - 145 mmol/L 140 137 139  Potassium 3.5 - 5.1 mmol/L 3.8 3.1(L) 3.4(L)  Chloride 98 - 111 mmol/L 107 102 104  CO2 22 - 32 mmol/L 25 24 23   Calcium 8.9 - 10.3 mg/dL 8.4(L) 8.2(L) 8.3(L)  Total Protein 6.5 - 8.1 g/dL - - -  Total Bilirubin 0.3 - 1.2 mg/dL - - -  Alkaline Phos 38 - 126 U/L - - -  AST 15 - 41 U/L - - -  ALT 0 - 44 U/L - - -    Imaging studies: No new pertinent imaging studies   Assessment/Plan:  68 y.o.malewith small bowel obstruction9Day Post-Ops/p lysis of adhesions and small bowel repair, complicated by  pertinent comorbidities includingsevere developmental delay, aspiration pneumonia and seizure disorder. Patient recovering slowly.  Today he had another bowel movement.  Still not eating significant amount of food enough to keep up with nutrition.  He ate some foods and sugar.  Hopefully these have not started that he will start eating.  He was started on Megace yesterday.  The white blood cell count decreased today to normal level.  Stable hemoglobin.  There has been no fever.  There has been no tachycardia.  We will continue with current management.  Currently on DVT prophylaxis.  The brother was abated about patient status.  As soon as patient starts to eat more he will be stable enough to be discharged.  Arnold Long, MD

## 2019-08-15 LAB — CBC
HCT: 40.9 % (ref 39.0–52.0)
Hemoglobin: 14 g/dL (ref 13.0–17.0)
MCH: 29.7 pg (ref 26.0–34.0)
MCHC: 34.2 g/dL (ref 30.0–36.0)
MCV: 86.8 fL (ref 80.0–100.0)
Platelets: 282 10*3/uL (ref 150–400)
RBC: 4.71 MIL/uL (ref 4.22–5.81)
RDW: 12.3 % (ref 11.5–15.5)
WBC: 13.1 10*3/uL — ABNORMAL HIGH (ref 4.0–10.5)
nRBC: 0 % (ref 0.0–0.2)

## 2019-08-15 LAB — POTASSIUM: Potassium: 3.8 mmol/L (ref 3.5–5.1)

## 2019-08-15 NOTE — Progress Notes (Signed)
[facility] -  at [facility]   PATIENT NAME: Richard Gillespie    MR#:  [id number]  DATE OF BIRTH:  14-Jan-1951  SUBJECTIVE: More awake, alert today, still refusing to eat,  CHIEF COMPLAINT:   Chief Complaint  Patient presents with  . Emesis   Patient developmentally delayed unable to provide any review of systems    REVIEW OF SYSTEMS:    ROS  Developmentally retarded unable to obtain any history  DRUG ALLERGIES:   Allergies  Allergen Reactions  . Zithromax [Azithromycin] Other (See Comments)    unknown    VITALS:  Blood pressure (!) 125/98, pulse 84, temperature (!) 97.5 F (36.4 C), temperature source Oral, resp. rate 18, height 5\' 8"  (1.727 m), weight 68 kg, SpO2 91 %.  PHYSICAL EXAMINATION:   Physical Exam  GENERAL:  68 y.o.-year-old patient lying in the bed, not eating much.   EYES: Pupils equal, round, reactive to light  No scleral icterus.  HEENT: Head atraumatic, normocephalic. Oropharynx NG tube noted NECK:  Supple, no jugular venous distention. No thyroid enlargement, no tenderness.  LUNGS: Normal breath sounds bilaterally, no wheezing, rales, rhonchi. No use of accessory muscles of respiration.  CARDIOVASCULAR: S1, S2 normal. No murmurs, rubs, or gallops.  ABDOMEN: Soft, nontender, surgical site no discharge. Bowel sounds decreased. No organomegaly or mass.  Patient has midline abdominal incision which is well-healed. EXTREMITIES: No cyanosis, clubbing or edema b/l.    NEUROLOGIC: Developmentally retarded Moves all extremities PSYCHIATRIC: Could not be assessed SKIN: No obvious rash, lesion, or ulcer.   LABORATORY PANEL:   CBC Recent Labs  Lab 08/15/19 0601  WBC 13.1*  HGB 14.0  HCT 40.9  PLT 282   ------------------------------------------------------------------------------------------------------------------ Chemistries  Recent Labs  Lab 08/14/19 0335 08/15/19 0501  NA 140  --   K 3.8 3.8  CL 107  --   CO2 25  --    GLUCOSE 124*  --   BUN 10  --   CREATININE 0.66  --   CALCIUM 8.4*  --   MG 2.1  --    ------------------------------------------------------------------------------------------------------------------  Cardiac Enzymes No results for input(s): TROPONINI in the last 168 hours. ------------------------------------------------------------------------------------------------------------------  RADIOLOGY:  No results found.   ASSESSMENT AND PLAN:   68 year old male patient with history of mental retardation, seizure disorder, bowel obstruction in the past is a resident of group home currently under hospitalist service  -Small bowel obstruction S/p laparotomy and lysis of adhesions Surgery is following.   Continue supportive care   -Acute hypokalemia, proved. -Seizure disorder Continue Keppra, Lamictal, Tegretol.  -DVT prophylaxis with [place] lovenox daily  -Aspiration pneumonia, ; treated with Zosyn, ID.  WBC normalized.  -Leukocytosis can be stress-induced, improved, right biceps hematoma ,orthopedic did not recommend any treatment, patient is not complaining of pain, also sleeping on the right side t   Essential hypertension,, patient is on clonidine patch continue that.  Hypokalemia, replace potassium.    All the records are reviewed and case discussed with Care Management/Social Worker. Management plans discussed with the patient, family and they are in agreement.  CODE STATUS: DNR  DVT Prophylaxis: SCDs  TOTAL TIME TAKING CARE OF THIS PATIENT: 35 minutes.   POSSIBLE D/C IN  DAYS, DEPENDING ON CLINICAL CONDITION.  Auburn BilberryShreyang Taela Charbonneau M.D on 08/15/2019 at 12:36 PM  Between 7am to 6pm - Pager - [phone]  After 6pm go to [email or web address] - password [email or web address] [facility]  [facility] [place] [facility]  Office  [phone]  CC: Primary care physician;  Baxter Hire, MD  Note: This dictation was prepared with Dragon dictation along with smaller phrase technology. Any  transcriptional errors that result from this process are unintentional.

## 2019-08-15 NOTE — Progress Notes (Signed)
MD notified: This patient had 5 liquid stool over the night would you like to check for C. Diff. No stool softeners have been given.

## 2019-08-15 NOTE — Progress Notes (Signed)
[place] Hospital Day(s): 12.   Post op day(s): 10 Days Post-Op.   Interval History: Patient seen and examined.  Patient today continues to be very alert and talkative.  Nurse reports that he had 5 bowel movements last night, watery.  Denies any fever.  Yesterday he ate some blueberries and Ensure.  There has been no sign of pain, no pain radiation and no alleviating or aggravating factors.  Patient most of the time just want to lay on his right side.  Nurse staff sat down the patient in a chair yesterday for a couple of minutes with the patient complaining to go back to bed.  It is very difficult to assess patient needs due to the developmental delay.  Vital signs in last 24 hours: [min-max] current  Temp:  [97.4 F (36.3 C)-97.9 F (36.6 C)] 97.5 F (36.4 C) (09/19 0432) Pulse Rate:  [77-84] 84 (09/19 0800) Resp:  [17-20] 18 (09/19 0432) BP: (125-146)/(78-98) 125/98 (09/19 0800) SpO2:  [91 %-97 %] 91 % (09/19 0800)     Height: 5\' 8"  (172.7 cm) Weight: 68 kg BMI (Calculated): 22.81   Physical Exam:  Constitutional: alert, cooperative and no distress  Respiratory: breathing non-labored at rest  Cardiovascular: regular rate and sinus rhythm  Gastrointestinal: soft, non-tender, and non-distended.  Wound is dry and clean  Labs:  CBC Latest Ref Rng & Units 08/14/2019 08/12/2019 08/11/2019  WBC 4.0 - 10.5 K/uL 8.3 17.5(H) 15.4(H)  Hemoglobin 13.0 - 17.0 g/dL 12.6(L) 13.6 14.3  Hematocrit 39.0 - 52.0 % 37.0(L) 40.5 42.7  Platelets 150 - 400 K/uL 215 224 199   CMP Latest Ref Rng & Units 08/15/2019 08/14/2019 08/12/2019  Glucose 70 - 99 mg/dL - 124(H) 129(H)  BUN 8 - 23 mg/dL - 10 13  Creatinine 0.61 - 1.24 mg/dL - 0.66 0.65  Sodium 135 - 145 mmol/L - 140 137  Potassium 3.5 - 5.1 mmol/L 3.8 3.8 3.1(L)  Chloride 98 - 111 mmol/L - 107 102  CO2 22 - 32 mmol/L - 25 24  Calcium 8.9 - 10.3 mg/dL - 8.4(L) 8.2(L)  Total Protein 6.5 - 8.1 g/dL - - -  Total Bilirubin 0.3 - 1.2  mg/dL - - -  Alkaline Phos 38 - 126 U/L - - -  AST 15 - 41 U/L - - -  ALT 0 - 44 U/L - - -    Imaging studies: No new pertinent imaging studies   Assessment/Plan:  68 y.o.malewith small bowel obstruction10Day Post-Ops/p lysis of adhesions and small bowel repair, complicated by pertinent comorbidities includingsevere developmental delay, aspiration pneumonia and seizure disorder.  Patient continued to recover slowly.  Yesterday with 5 bowel movements.  There has been no fever or tachycardia.  Just to read but was not count decreased to normal.  There is a very low risk of C. difficile since the white blood cell count is normal, there has been no more fever and patient was out of antibiotic for 3 days.  If he continues doing diarrhea during the day will consider ordering C. difficile.  Also will continue to try to see how patient will increase his food intake.  He has been treated for the oral thrush and there is no sign that this is causing him any symptoms but is very difficult to assess.  Patient does not complain of abdominal pain or any specific complaint.  He is active and very alert.  We will continue with DVT prophylaxis.  Appreciate hospitalist follow-up  and management of medical conditions.  Gae GallopEdgardo Cintrn-Daz, MD

## 2019-08-15 NOTE — Plan of Care (Signed)
The patient eat 8 bites of eggs this morning. He had 1 and 1/2 bottle of ensure today. 8 bites of spaghetti with his brother today. He had two magic coups. The patient ambulated one lap around the nursing station. No falls. The patient is incontinent of bowels and urine.    Problem: Education: Goal: Knowledge of General Education information will improve Description: Including pain rating scale, medication(s)/side effects and non-pharmacologic comfort measures Outcome: Progressing   Problem: Health Behavior/Discharge Planning: Goal: Ability to manage health-related needs will improve Outcome: Progressing   Problem: Clinical Measurements: Goal: Ability to maintain clinical measurements within normal limits will improve Outcome: Progressing Goal: Will remain free from infection Outcome: Progressing Goal: Diagnostic test results will improve Outcome: Progressing Goal: Respiratory complications will improve Outcome: Progressing Goal: Cardiovascular complication will be avoided Outcome: Progressing   Problem: Activity: Goal: Risk for activity intolerance will decrease Outcome: Progressing   Problem: Nutrition: Goal: Adequate nutrition will be maintained Outcome: Progressing   Problem: Coping: Goal: Level of anxiety will decrease Outcome: Progressing   Problem: Elimination: Goal: Will not experience complications related to bowel motility Outcome: Progressing Goal: Will not experience complications related to urinary retention Outcome: Progressing   Problem: Pain Managment: Goal: General experience of comfort will improve Outcome: Progressing   Problem: Safety: Goal: Ability to remain free from injury will improve Outcome: Progressing   Problem: Skin Integrity: Goal: Risk for impaired skin integrity will decrease Outcome: Progressing

## 2019-08-16 MED ORDER — LORAZEPAM 2 MG/ML IJ SOLN
1.0000 mg | Freq: Once | INTRAMUSCULAR | Status: AC
  Administered 2019-08-16: 18:00:00 1 mg via INTRAVENOUS
  Filled 2019-08-16: qty 1

## 2019-08-16 MED ORDER — LORAZEPAM BOLUS VIA INFUSION: 1.0000 mg | Freq: Once | INTRAVENOUS | Status: DC

## 2019-08-16 NOTE — Progress Notes (Signed)
The patient consumed 3 ensures today. 1 and 1/2 magic cups. The patient eat 5 percent of his meal delivered at breakfast and lunch. He ambulated 2 different times with the RN. The patient was very active today. He kept on trying to get out of bed and asked about going home. The patient was agitated and had to be given a one time dose of 1mg  ativan.

## 2019-08-16 NOTE — Progress Notes (Signed)
[facility] - [place] [facility]   PATIENT NAME: Richard DameMark Stellmach    MR#:  [id number]  DATE OF BIRTH:  1951/07/26  SUBJECTIVE: More awake, alert today, still refusing to eat,  CHIEF COMPLAINT:   Chief Complaint  Patient presents with  . Emesis   Patient has mental retardation unable to provide any review of systems    REVIEW OF SYSTEMS:    ROS  Developmentally retarded unable to obtain any history  DRUG ALLERGIES:   Allergies  Allergen Reactions  . Zithromax [Azithromycin] Other (See Comments)    unknown    VITALS:  Blood pressure 133/80, pulse 84, temperature 98.5 F (36.9 C), temperature source Axillary, resp. rate 18, height 5\' 8"  (1.727 m), weight 68 kg, SpO2 99 %.  PHYSICAL EXAMINATION:   Physical Exam  GENERAL:  68 y.o.-year-old patient lying in the bed, not eating much.   EYES: Pupils equal, round, reactive to light  No scleral icterus.  HEENT: Head atraumatic, normocephalic. Oropharynx NG tube noted NECK:  Supple, no jugular venous distention. No thyroid enlargement, no tenderness.  LUNGS: Normal breath sounds bilaterally, no wheezing, rales, rhonchi. No use of accessory muscles of respiration.  CARDIOVASCULAR: S1, S2 normal. No murmurs, rubs, or gallops.  ABDOMEN: Soft, nontender, surgical site no discharge. Bowel sounds decreased. No organomegaly or mass.  Patient has midline abdominal incision which is well-healed. EXTREMITIES: No cyanosis, clubbing or edema b/l.    NEUROLOGIC: Developmentally retarded Moves all extremities PSYCHIATRIC: Could not be assessed SKIN: No obvious rash, lesion, or ulcer.   LABORATORY PANEL:   CBC Recent Labs  Lab 08/15/19 0601  WBC 13.1*  HGB 14.0  HCT 40.9  PLT 282   ------------------------------------------------------------------------------------------------------------------ Chemistries  Recent Labs  Lab 08/14/19 0335 08/15/19 0501  NA 140  --   K 3.8 3.8  CL 107  --   CO2 25  --    GLUCOSE 124*  --   BUN 10  --   CREATININE 0.66  --   CALCIUM 8.4*  --   MG 2.1  --    ------------------------------------------------------------------------------------------------------------------  Cardiac Enzymes No results for input(s): TROPONINI in the last 168 hours. ------------------------------------------------------------------------------------------------------------------  RADIOLOGY:  No results found.   ASSESSMENT AND PLAN:   68 year old male patient with history of mental retardation, seizure disorder, bowel obstruction in the past is a resident of group home currently under hospitalist service  -Small bowel obstruction S/p laparotomy and lysis of adhesions Surgery is following.   Continue supportive care   -Acute hypokalemia, proved. -Seizure disorder Continue Keppra, Lamictal, Tegretol.  -DVT prophylaxis with [place] lovenox daily  -Aspiration pneumonia, ; treated with Zosyn, ID.  WBC normalized.  -Leukocytosis can be stress-induced, improved, right biceps hematoma ,orthopedic did not recommend any treatment, patient is not complaining of pain, also sleeping on the right side t   Essential hypertension,, patient is on clonidine patch continue that.  Hypokalemia, replace potassium.    All the records are reviewed and case discussed with Care Management/Social Worker. Management plans discussed with the patient, family and they are in agreement.  CODE STATUS: DNR  DVT Prophylaxis: SCDs  TOTAL TIME TAKING CARE OF THIS PATIENT: 35 minutes.   POSSIBLE D/C IN  DAYS, DEPENDING ON CLINICAL CONDITION.  Auburn BilberryShreyang Seneca Gadbois M.D on 08/16/2019 at 12:28 PM  Between 7am to 6pm - Pager - [phone]  After 6pm go to [email or web address] - password [email or web address] [facility]  [facility] [place] [facility]  Office  [phone]  CC: Primary care physician; Letitia LibraJohnston,  Chrystie Nose, MD  Note: This dictation was prepared with Dragon dictation along with smaller phrase technology. Any  transcriptional errors that result from this process are unintentional.

## 2019-08-16 NOTE — Progress Notes (Signed)
[place] Hospital Day(s): 13.   Post op day(s): 11 Days Post-Op.   Interval History: Patient seen and examined, no acute events or new complaints overnight.  Patient more active today.  Nurse reported patient had 2 bowel movement last night.  There has been no nausea or vomiting.  Yesterday he was able to eat a better amount including eggs, spaghetti, and Ensure and ice cream with protein.  Vital signs in last 24 hours: [min-max] current  Temp:  [97.9 F (36.6 C)-98 F (36.7 C)] 97.9 F (36.6 C) (09/20 0612) Pulse Rate:  [84-93] 84 (09/20 0612) Resp:  [18-20] 18 (09/20 0612) BP: (119-142)/(64-83) 142/76 (09/20 0612) SpO2:  [95 %-100 %] 100 % (09/20 0612)     Height: 5\' 8"  (172.7 cm) Weight: 68 kg BMI (Calculated): 22.81    Physical Exam:  Constitutional: alert, cooperative and no distress  Respiratory: breathing non-labored at rest  Cardiovascular: regular rate and sinus rhythm  Gastrointestinal: soft, non-tender, and non-distended  Labs:  CBC Latest Ref Rng & Units 08/15/2019 08/14/2019 08/12/2019  WBC 4.0 - 10.5 K/uL 13.1(H) 8.3 17.5(H)  Hemoglobin 13.0 - 17.0 g/dL 14.0 12.6(L) 13.6  Hematocrit 39.0 - 52.0 % 40.9 37.0(L) 40.5  Platelets 150 - 400 K/uL 282 215 224   CMP Latest Ref Rng & Units 08/15/2019 08/14/2019 08/12/2019  Glucose 70 - 99 mg/dL - 124(H) 129(H)  BUN 8 - 23 mg/dL - 10 13  Creatinine 0.61 - 1.24 mg/dL - 0.66 0.65  Sodium 135 - 145 mmol/L - 140 137  Potassium 3.5 - 5.1 mmol/L 3.8 3.8 3.1(L)  Chloride 98 - 111 mmol/L - 107 102  CO2 22 - 32 mmol/L - 25 24  Calcium 8.9 - 10.3 mg/dL - 8.4(L) 8.2(L)  Total Protein 6.5 - 8.1 g/dL - - -  Total Bilirubin 0.3 - 1.2 mg/dL - - -  Alkaline Phos 38 - 126 U/L - - -  AST 15 - 41 U/L - - -  ALT 0 - 44 U/L - - -    Imaging studies: No new pertinent imaging studies   Assessment/Plan:  68 y.o.malewith small bowel obstruction11Day Post-Ops/p lysis of adhesions and small bowel repair, complicated by  pertinent comorbidities includingsevere developmental delay, aspiration pneumonia and seizure disorder. Patient improving slowly.  Today more active.  Also more talkative.  He had 2 bowel movement last night.  I will ensure that he is eating adequate amount today.  If he does he may be discharged tomorrow.  He is on DVT prophylaxis.  I appreciate hospitalist management of medical condition.  We will continue with treatment of oral thrush.  Arnold Long, MD

## 2019-08-17 LAB — BASIC METABOLIC PANEL
Anion gap: 12 (ref 5–15)
BUN: 17 mg/dL (ref 8–23)
CO2: 23 mmol/L (ref 22–32)
Calcium: 8.9 mg/dL (ref 8.9–10.3)
Chloride: 104 mmol/L (ref 98–111)
Creatinine, Ser: 0.67 mg/dL (ref 0.61–1.24)
GFR calc Af Amer: >60 mL/min (ref >60)
GFR calc non Af Amer: >60 mL/min (ref >60)
Glucose, Bld: 137 mg/dL — ABNORMAL HIGH (ref 70–99)
Potassium: 4 mmol/L (ref 3.5–5.1)
Sodium: 139 mmol/L (ref 135–145)

## 2019-08-17 LAB — CBC
HCT: 44.4 % (ref 39.0–52.0)
Hemoglobin: 15.2 g/dL (ref 13.0–17.0)
MCH: 29.6 pg (ref 26.0–34.0)
MCHC: 34.2 g/dL (ref 30.0–36.0)
MCV: 86.5 fL (ref 80.0–100.0)
Platelets: 380 10*3/uL (ref 150–400)
RBC: 5.13 MIL/uL (ref 4.22–5.81)
RDW: 12.6 % (ref 11.5–15.5)
WBC: 15.3 10*3/uL — ABNORMAL HIGH (ref 4.0–10.5)
nRBC: 0 % (ref 0.0–0.2)

## 2019-08-17 NOTE — TOC Transition Note (Signed)
Transition of Care [facility]) - CM/SW Discharge Note   Patient Details  Name: Richard Gillespie MRN: [id number] Date of Birth: [birth year]  Transition of Care [facility] CM/SW Contact:  Richard Sessions, RN Phone Number: 08/17/2019, 5:13 PM   Clinical Narrative:     [facility] to [facility].  Clinical faxed to nurse Butch Penny at group home.  Bedside RN to call report.   PT orders faxed to Butch Penny, states that she will arrange services at the group home  RNCM notified brother Richard Gillespie of discharge.  He is in agreement   Final next level of care: Group Home Barriers to Discharge: No Barriers Identified   Patient Goals and CMS Choice Patient states their goals for this hospitalization and ongoing recovery are:: Patient not fully oriented.   Choice offered to / list presented to : NA  Discharge Placement                  Name of family member notified: Richard Gillespie Patient and family notified of of transfer: 08/17/19  Discharge Plan and Services                                     Social Determinants of Health (SDOH) Interventions     Readmission Risk Interventions No flowsheet data found.

## 2019-08-17 NOTE — Progress Notes (Signed)
Richard Gillespie to be D/C'd [place] per MD order.  Discussed prescriptions and follow up appointments with the patient. Prescriptions given to patient, medication list explained in detail. Pt verbalized understanding.  Allergies as of 08/17/2019      Reactions   Zithromax [azithromycin] Other (See Comments)   unknown      Medication List    TAKE these medications   bisacodyl 10 MG suppository Commonly known as: DULCOLAX Place 10 mg rectally as needed for moderate constipation.   carbamazepine 200 MG 12 hr tablet Commonly known as: TEGRETOL XR Take 200 mg by mouth 4 (four) times daily.   cloNIDine 0.1 mg/24hr patch Commonly known as: CATAPRES - Dosed in mg/24 hr Place 0.1 mg onto the skin once a week.   feeding supplement (ENSURE ENLIVE) Liqd Take 237 mLs by mouth 2 (two) times daily between meals.   lactulose 10 GM/15ML solution Commonly known as: CHRONULAC Take 10 g by mouth 2 (two) times daily as needed for mild constipation.   lamoTRIgine 150 MG tablet Commonly known as: LAMICTAL Take 75 mg by mouth 2 (two) times daily.   levETIRAcetam 500 MG tablet Commonly known as: KEPPRA Take 1 tablet (500 mg total) by mouth 2 (two) times daily.   loratadine 10 MG tablet Commonly known as: CLARITIN Take 10 mg by mouth daily.   LORazepam 0.5 MG tablet Commonly known as: ATIVAN Take 0.5 mg by mouth as needed for anxiety (max 4 tablets daily).   magnesium citrate Soln Take 1 Bottle by mouth daily as needed for severe constipation (at least 2 days of no BM).   medroxyPROGESTERone 10 MG tablet Commonly known as: PROVERA Take 10 mg by mouth daily.   mupirocin nasal ointment 2 % Commonly known as: BACTROBAN Place 1 application into the nose 2 (two) times daily. Use one-half of tube in each nostril twice daily for five (5) days. After application, press sides of nose together and gently massage.   polyethylene glycol 17 g packet Commonly known as: MIRALAX /  GLYCOLAX Take 17 g by mouth daily as needed for mild constipation or moderate constipation.   senna 8.6 MG Tabs tablet Commonly known as: SENOKOT Take 2 tablets by mouth 2 (two) times daily.   Therems Tabs Take 1 tablet by mouth daily.   traZODone 150 MG tablet Commonly known as: DESYREL Take 300 mg by mouth at bedtime.       Vitals:   08/17/19 0541 08/17/19 1301  BP: 125/86 (!) 142/90  Pulse: 97 90  Resp: 20 16  Temp: 98.3 F (36.8 C) 98 F (36.7 C)  SpO2: 96% 99%    Skin clean, dry and intact without evidence of skin break down, no evidence of skin tears noted. IV catheter discontinued intact. Site without signs and symptoms of complications. Dressing and pressure applied. Pt denies pain at this time. No complaints noted.  An After Visit Summary was printed and given to the patient. Patient escorted via [facility], and D/C [place] via private auto.  Chuck Hint RN [facility] [employer]

## 2019-08-17 NOTE — [place] FL2 (Signed)
[place] MEDICAID FL2 LEVEL OF CARE SCREENING TOOL     IDENTIFICATION  Patient Name: Richard Gillespie Birthdate: Sep 13, 1951 Sex: male Admission Date (Current Location): 08/03/2019  [facility] and [place] Number:  Chiropodist and Address:         Provider Number: [id number]  Attending Physician Name and Address:  Carolan Shiver, MD  Relative Name and Phone Number:       Current Level of Care: Hospital Recommended Level of Care: Other (Comment)(Group home) Prior Approval Number:    Date Approved/Denied:   PASRR Number:    Discharge Plan: Other (Comment)(group home)    Current Diagnoses: Patient Active Problem List   Diagnosis Date Noted  . Small bowel obstruction (HCC) 08/03/2019  . Malnutrition of moderate degree 09/17/2018  . SBO (small bowel obstruction) (HCC) 09/12/2018  . Aspiration pneumonia (HCC) 09/12/2018  . Seizure disorder (HCC) 09/12/2018  . Sepsis (HCC) 09/12/2018  . E coli infection 01/15/2017  . Agitation 01/15/2017  . Diarrhea 01/15/2017  . Hypokalemia 01/15/2017  . Hyponatremia 01/15/2017  . Leukocytosis 01/15/2017  . Hyperglycemia 01/15/2017  . UTI (urinary tract infection) 01/12/2017    Orientation RESPIRATION BLADDER Height & Weight        Normal Incontinent Weight: 68 kg Height:  5\' 8"  (172.7 cm)  BEHAVIORAL SYMPTOMS/MOOD NEUROLOGICAL BOWEL NUTRITION STATUS      Incontinent Diet(low sodium heart healthy - chopped)  AMBULATORY STATUS COMMUNICATION OF NEEDS Skin   Extensive Assist Verbally Other (Comment), Surgical wounds                       Personal Care Assistance Level of Assistance              Functional Limitations Info             SPECIAL CARE FACTORS FREQUENCY  PT (By licensed PT), OT (By licensed OT)                    Contractures Contractures Info: Not present    Additional Factors Info  Code Status, Allergies Code Status Info: DNR Allergies Info: zithromax            Medication List    TAKE these medications   bisacodyl 10 MG suppository Commonly known as: DULCOLAX Place 10 mg rectally as needed for moderate constipation.   carbamazepine 200 MG 12 hr tablet Commonly known as: TEGRETOL XR Take 200 mg by mouth 4 (four) times daily.   cloNIDine 0.1 mg/24hr patch Commonly known as: CATAPRES - Dosed in mg/24 hr Place 0.1 mg onto the skin once a week.   feeding supplement (ENSURE ENLIVE) Liqd Take 237 mLs by mouth 2 (two) times daily between meals.   lactulose 10 GM/15ML solution Commonly known as: CHRONULAC Take 10 g by mouth 2 (two) times daily as needed for mild constipation.   lamoTRIgine 150 MG tablet Commonly known as: LAMICTAL Take 75 mg by mouth 2 (two) times daily.   levETIRAcetam 500 MG tablet Commonly known as: KEPPRA Take 1 tablet (500 mg total) by mouth 2 (two) times daily.   loratadine 10 MG tablet Commonly known as: CLARITIN Take 10 mg by mouth daily.   LORazepam 0.5 MG tablet Commonly known as: ATIVAN Take 0.5 mg by mouth as needed for anxiety (max 4 tablets daily).   magnesium citrate Soln Take 1 Bottle by mouth daily as needed for severe constipation (at least 2 days of no BM).   medroxyPROGESTERone  10 MG tablet Commonly known as: PROVERA Take 10 mg by mouth daily.   mupirocin nasal ointment 2 % Commonly known as: BACTROBAN Place 1 application into the nose 2 (two) times daily. Use one-half of tube in each nostril twice daily for five (5) days. After application, press sides of nose together and gently massage.   polyethylene glycol 17 g packet Commonly known as: MIRALAX / GLYCOLAX Take 17 g by mouth daily as needed for mild constipation or moderate constipation.   senna 8.6 MG Tabs tablet Commonly known as: SENOKOT Take 2 tablets by mouth 2 (two) times daily.   Therems Tabs Take 1 tablet by mouth daily.   traZODone 150 MG tablet Commonly known as: DESYREL Take 300 mg by mouth at  bedtime.    Discharge Medications: Please see discharge summary for a list of discharge medications.  Relevant Imaging Results:  Relevant Lab Results:   Additional Information    Krysten Veronica, Illene Silver, RN

## 2019-08-17 NOTE — Discharge Summary (Signed)
Patient ID: Richard Gillespie MRN: [id number] DOB/AGE: 1951-03-16 68 y.o.  Admit date: 08/03/2019 Discharge date: 08/17/2019   Discharge Diagnoses:  Active Problems:   Small bowel obstruction (HCC)   Procedures: Exploratory laparotomy with lysis of adhesions  Hospital Course: Patient with small bowel obstruction.  The bowel obstruction did not resolve with conservative management.  He needed exploratory laparotomy and lysis of adhesion was done.  He tolerated the procedure well.  It took sometimes for the intestines to start working again but eventually he GI function came back.  He is tolerating regular diet.  He has been eating more this weekend.  He has been having bowel movements.  He has not had any fever in the last 6 days.  The wound is dry and clean.  I have removed the staples today.  He can resume heart healthy chopped diet.  He can continue taking Ensure twice per day.  I will order physical therapy to help him regain his strength.  He has been ambulating around the nurses station.  Physical Exam  Cardiovascular: Normal rate and regular rhythm.  Pulmonary/Chest: Effort normal.  Abdominal: Soft. Bowel sounds are normal. He exhibits no distension. There is no abdominal tenderness. There is no rebound.  Neurological: He is alert.  Skin: Skin is warm.     Consults: Hospitalist                   Orthopedic surgery  Disposition: Discharge disposition: 01-Home or Self Care       Discharge Instructions    Diet - low sodium heart healthy   Complete by: As directed    Chopped   Increase activity slowly   Complete by: As directed      Allergies as of 08/17/2019      Reactions   Zithromax [azithromycin] Other (See Comments)   unknown      Medication List    TAKE these medications   bisacodyl 10 MG suppository Commonly known as: DULCOLAX Place 10 mg rectally as needed for moderate constipation.   carbamazepine 200 MG 12 hr tablet Commonly known as: TEGRETOL XR Take  200 mg by mouth 4 (four) times daily.   cloNIDine 0.1 mg/24hr patch Commonly known as: CATAPRES - Dosed in mg/24 hr Place 0.1 mg onto the skin once a week.   feeding supplement (ENSURE ENLIVE) Liqd Take 237 mLs by mouth 2 (two) times daily between meals.   lactulose 10 GM/15ML solution Commonly known as: CHRONULAC Take 10 g by mouth 2 (two) times daily as needed for mild constipation.   lamoTRIgine 150 MG tablet Commonly known as: LAMICTAL Take 75 mg by mouth 2 (two) times daily.   levETIRAcetam 500 MG tablet Commonly known as: KEPPRA Take 1 tablet (500 mg total) by mouth 2 (two) times daily.   loratadine 10 MG tablet Commonly known as: CLARITIN Take 10 mg by mouth daily.   LORazepam 0.5 MG tablet Commonly known as: ATIVAN Take 0.5 mg by mouth as needed for anxiety (max 4 tablets daily).   magnesium citrate Soln Take 1 Bottle by mouth daily as needed for severe constipation (at least 2 days of no BM).   medroxyPROGESTERone 10 MG tablet Commonly known as: PROVERA Take 10 mg by mouth daily.   mupirocin nasal ointment 2 % Commonly known as: BACTROBAN Place 1 application into the nose 2 (two) times daily. Use one-half of tube in each nostril twice daily for five (5) days. After application, press sides of nose together  and gently massage.   polyethylene glycol 17 g packet Commonly known as: MIRALAX / GLYCOLAX Take 17 g by mouth daily as needed for mild constipation or moderate constipation.   senna 8.6 MG Tabs tablet Commonly known as: SENOKOT Take 2 tablets by mouth 2 (two) times daily.   Therems Tabs Take 1 tablet by mouth daily.   traZODone 150 MG tablet Commonly known as: DESYREL Take 300 mg by mouth at bedtime.      Follow-up Information    Herbert Pun, MD Follow up in 2 week(s).   Specialty: General Surgery Contact information: [address] [place] [address] [phone]

## 2019-08-17 NOTE — Care Management Important Message (Signed)
Important Message  Patient Details  Name: Richard Gillespie MRN: [id number] Date of Birth: 1951/06/27   Medicare Important Message Given:  Yes   Reviewed with Heide Guile, brother, at [phone].  Confirmed he received copy of Medicare IM emailed previously.   Dannette Barbara 08/17/2019, 11:21 AM

## 2019-08-17 NOTE — Progress Notes (Signed)
[place] [facility] [place] NAME: Nicholos Aloisi    MR#:  [id number]  DATE OF BIRTH:  28-Dec-1950  SUBJECTIVE: More awake, alert today, still refusing to eat,  CHIEF COMPLAINT:   Chief Complaint  Patient presents with  . Emesis   Patient has mental retardation unable to provide any review of systems    REVIEW OF SYSTEMS:    ROS  Developmentally retarded unable to obtain any history  DRUG ALLERGIES:   Allergies  Allergen Reactions  . Zithromax [Azithromycin] Other (See Comments)    unknown    VITALS:  Blood pressure (!) 142/90, pulse 90, temperature 98 F (36.7 C), temperature source Oral, resp. rate 16, height 5\' 8"  (1.727 m), weight 68 kg, SpO2 99 %.  PHYSICAL EXAMINATION:   Physical Exam  GENERAL:  68 y.o.-year-old patient lying in the bed, not eating much.   EYES: Pupils equal, round, reactive to light  No scleral icterus.  HEENT: Head atraumatic, normocephalic. Oropharynx NG tube noted NECK:  Supple, no jugular venous distention. No thyroid enlargement, no tenderness.  LUNGS: Normal breath sounds bilaterally, no wheezing, rales, rhonchi. No use of accessory muscles of respiration.  CARDIOVASCULAR: S1, S2 normal. No murmurs, rubs, or gallops.  ABDOMEN: Soft, nontender, surgical site no discharge. Bowel sounds decreased. No organomegaly or mass.  Patient has midline abdominal incision which is well-healed. EXTREMITIES: No cyanosis, clubbing or edema b/l.    NEUROLOGIC: Developmentally retarded Moves all extremities PSYCHIATRIC: Could not be assessed SKIN: No obvious rash, lesion, or ulcer.   LABORATORY PANEL:   CBC Recent Labs  Lab 08/17/19 0455  WBC 15.3*  HGB 15.2  HCT 44.4  PLT 380   ------------------------------------------------------------------------------------------------------------------ Chemistries  Recent Labs  Lab 08/14/19 0335  08/17/19 0455  NA 140  --  139  K 3.8   < > 4.0  CL 107  --  104  CO2  25  --  23  GLUCOSE 124*  --  137*  BUN 10  --  17  CREATININE 0.66  --  0.67  CALCIUM 8.4*  --  8.9  MG 2.1  --   --    < > = values in this interval not displayed.   ------------------------------------------------------------------------------------------------------------------  Cardiac Enzymes No results for input(s): TROPONINI in the last 168 hours. ------------------------------------------------------------------------------------------------------------------  RADIOLOGY:  No results found.   ASSESSMENT AND PLAN:   68 year old male patient with history of mental retardation, seizure disorder, bowel obstruction in the past is a resident of group home currently under hospitalist service  -Small bowel obstruction S/p laparotomy and lysis of adhesions Surgery is following.   Continue supportive care   -Acute hypokalemia, proved. -Seizure disorder Continue Keppra, Lamictal, Tegretol.  -DVT prophylaxis with [place] lovenox daily  -Aspiration pneumonia, ; treated with Zosyn, ID.  WBC normalized.  -Leukocytosis can be stress-induced, improved, right biceps hematoma ,orthopedic did not recommend any treatment, patient is not complaining of pain, also sleeping on the right side t   Essential hypertension,, patient is on clonidine patch continue that.  Hypokalemia, potassium has been replaced  Okay for patient to be discharged home.   All the records are reviewed and case discussed with Care Management/Social Worker. Management plans discussed with the patient, family and they are in agreement.  CODE STATUS: DNR  DVT Prophylaxis: SCDs  TOTAL TIME TAKING CARE OF THIS PATIENT: 35 minutes.   POSSIBLE D/C IN  DAYS, DEPENDING ON CLINICAL CONDITION.  Dustin Flock M.D on 08/17/2019  at 1:59 PM  Between 7am to 6pm - Pager - [phone]  After 6pm go to [email or web address] - password [email or web address] [facility]  [facility]  Office  [phone]  CC: Primary care physician;  Gracelyn NurseJohnston, John D, MD  Note: This dictation was prepared with Dragon dictation along with smaller phrase technology. Any transcriptional errors that result from this process are unintentional.

## 2019-08-17 NOTE — Discharge Instructions (Signed)
°  Diet: Resume home heart healthy chopped regular diet.   Activity: No heavy lifting >20 pounds (children, pets, laundry, garbage) or strenuous activity until follow-up, but light activity and walking are encouraged. Do not drive or drink alcohol if taking narcotic pain medications.  Wound care: May shower with soapy water and pat dry (do not rub incisions), but no baths or submerging incision underwater until follow-up. (no swimming)   Medications: Resume all home medications. For mild to moderate pain: acetaminophen (Tylenol) or ibuprofen (if no kidney disease). Combining Tylenol with alcohol can substantially increase your risk of causing liver disease.   Call office [phone]) at any time if any questions, worsening pain, fevers/chills, bleeding, drainage from incision site, or other concerns.

## 2020-02-16 IMAGING — CR DG ABDOMEN 2V
2 series · 2 of 2 positions shown · non-contrast
Comparison: No plain film abdomen available for comparison.

CLINICAL DATA: 67-year-old male with a history of nausea vomiting
and diarrhea

EXAM:
ABDOMEN - 2 VIEW

[abdomen erect]
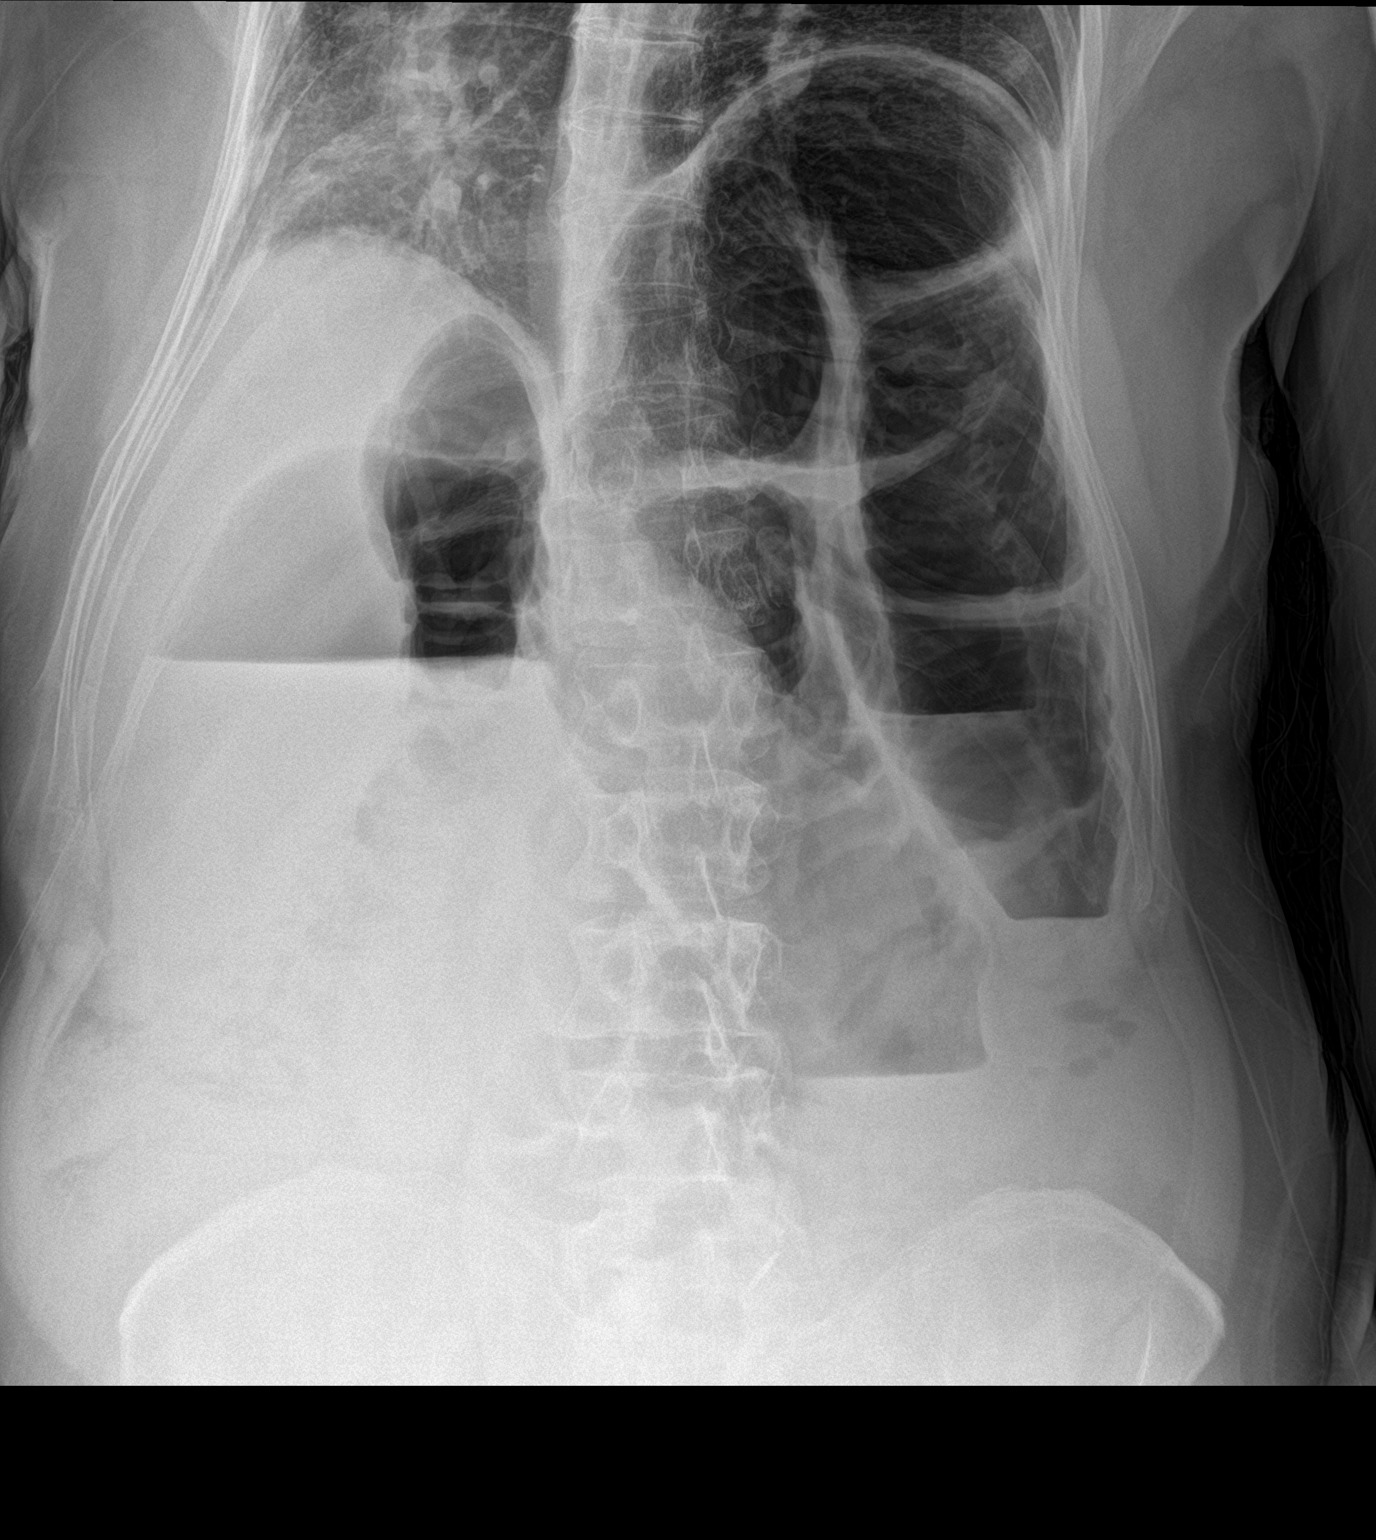

[abdomen supine]
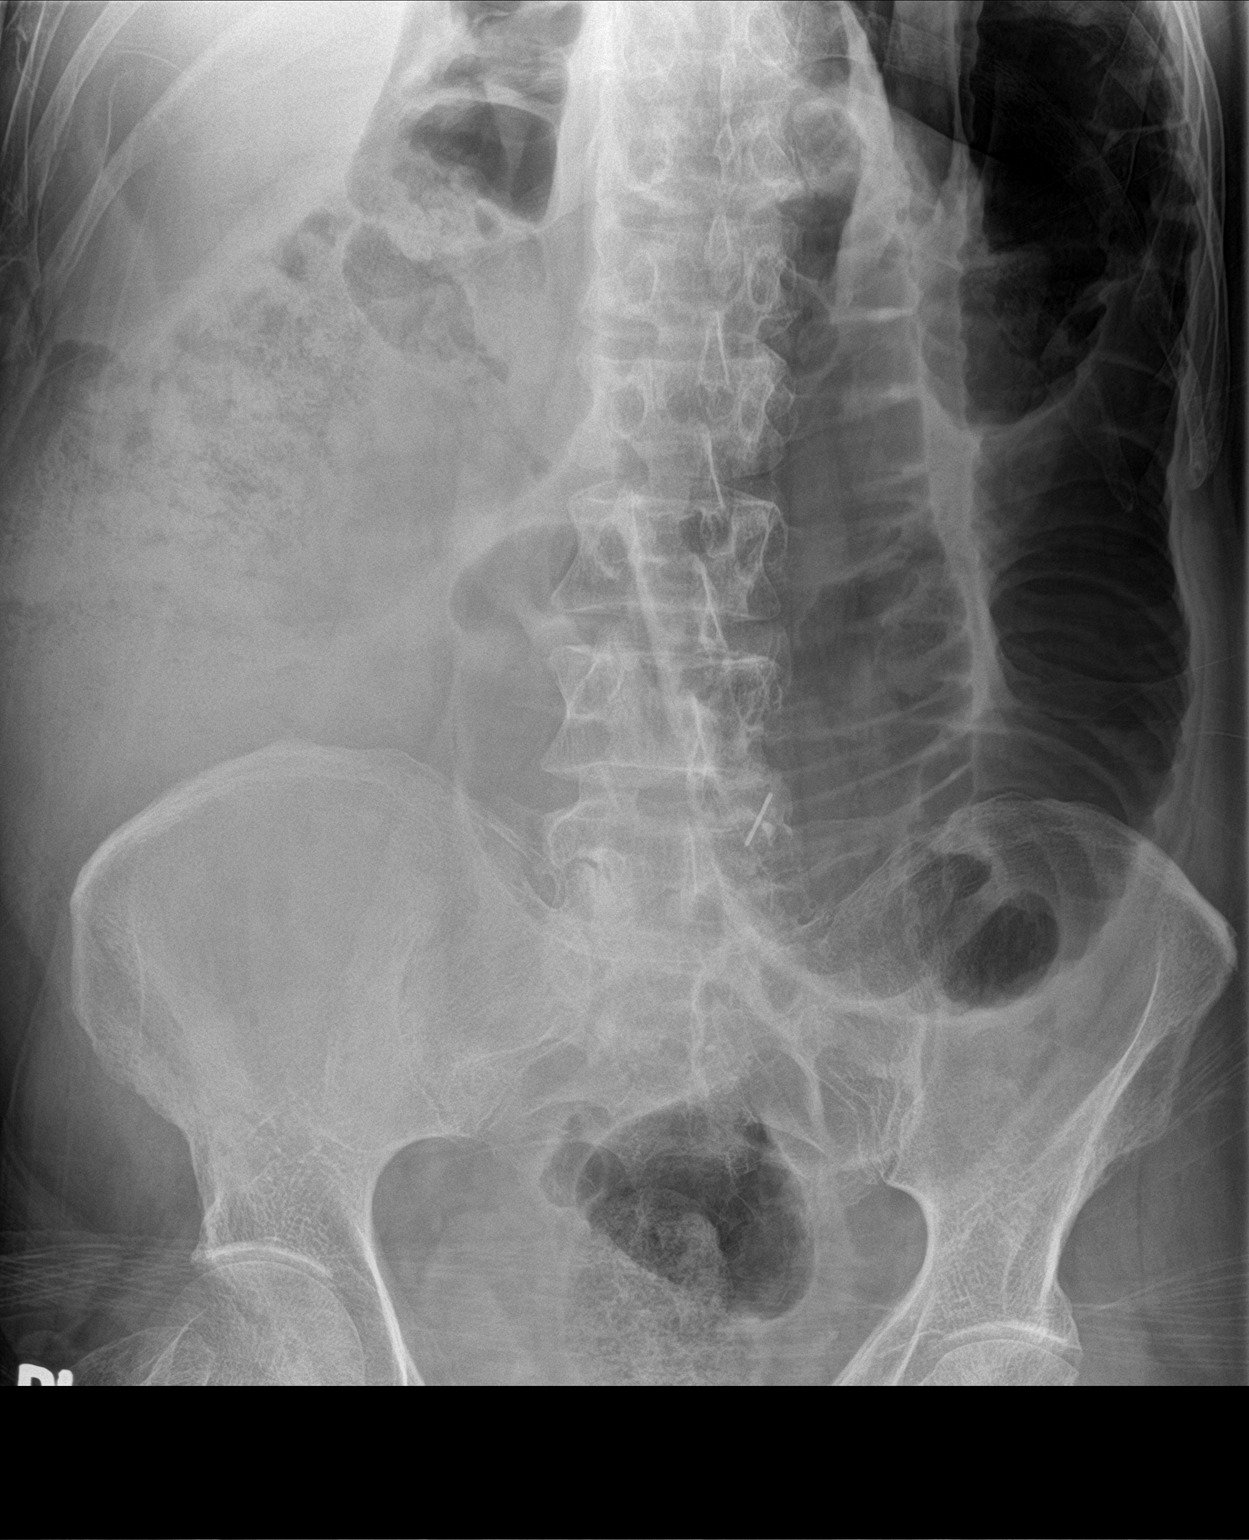

[2 of 2 positions shown; findings below may reference images not displayed]

FINDINGS: Gaseous distention of colon with air-fluid levels of the left and
right:. The configuration of colon in the left abdomen is similar to
prior chest x-ray dated 01/13/2017. Formed stool in the right colon
with small volume formed stool in the rectum. Gas extends to the
rectum.

Relative paucity of small bowel gas and gastric air.

Surgical clips in the mid abdomen.  No acute displaced fracture.
IMPRESSION: Plain film demonstrates colonic distention with multiple air-fluid
levels, in a configuration that was present on a prior chest chest
x-ray. Findings may represent chronic changes, however, a fixed
obstruction cannot be excluded, and if there is concern for acute
intra-abdominal process, contrast-enhanced CT may be useful.

## 2020-02-16 IMAGING — CT CT ABD-PELV W/ CM
2 of 5 series · 16 of 46 positions shown, 18 images · IV contrast (APPLIED)
Comparison: Radiography same day

CLINICAL DATA: History of bowel obstruction.  Nausea and vomiting.

EXAM:
CT ABDOMEN AND PELVIS WITH CONTRAST
TECHNIQUE: Multidetector CT imaging of the abdomen and pelvis was performed
using the standard protocol following bolus administration of
intravenous contrast.
CONTRAST:  100mL FEDGAL-SYY IOPAMIDOL (FEDGAL-SYY) INJECTION 61%

[Series 2: routine abd/pel with · axial · 0.81mm/px · z∈[-389,+6]mm · 13 of 91 slices shown, 15 images]
[im 6/91  soft-tissue]
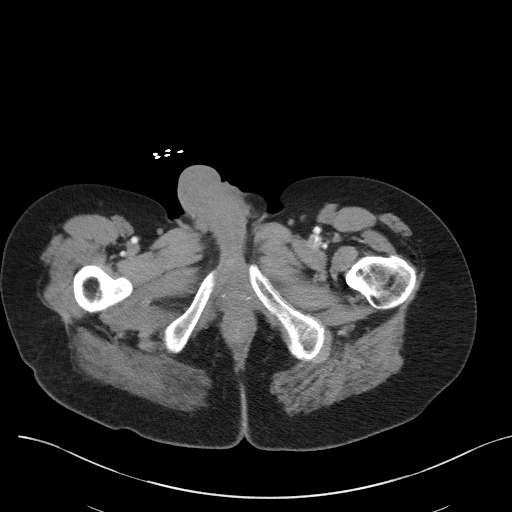
[im 6/91  bone]
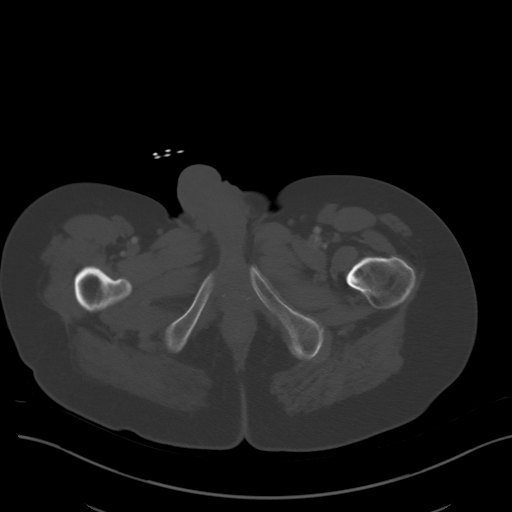
[im 12/91  soft-tissue]
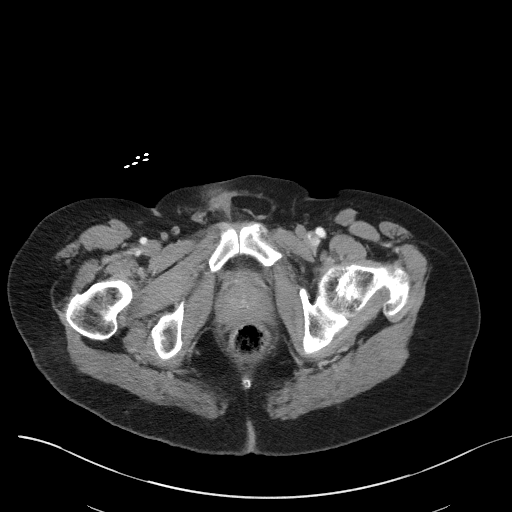
[im 17/91  soft-tissue]
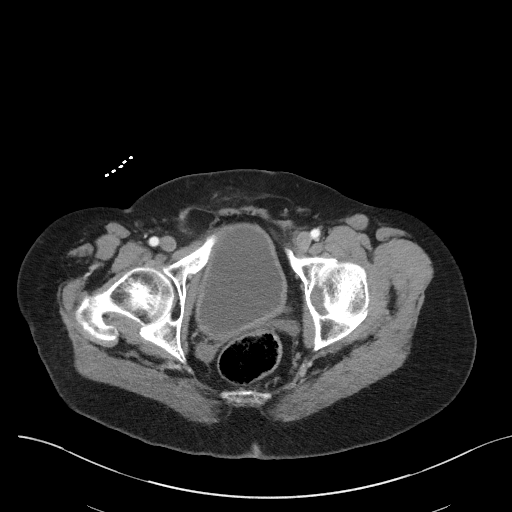
[im 29/91  soft-tissue]
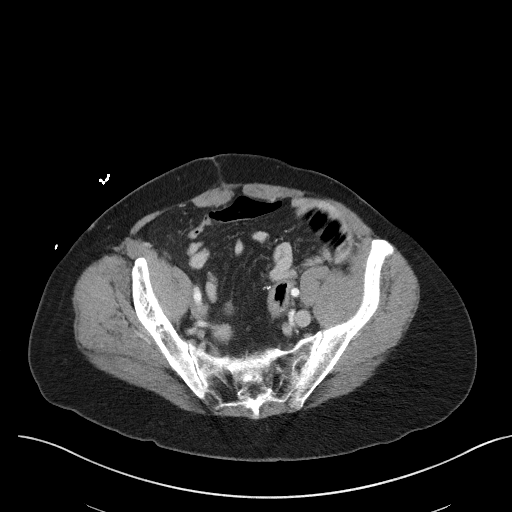
[im 34/91  soft-tissue]
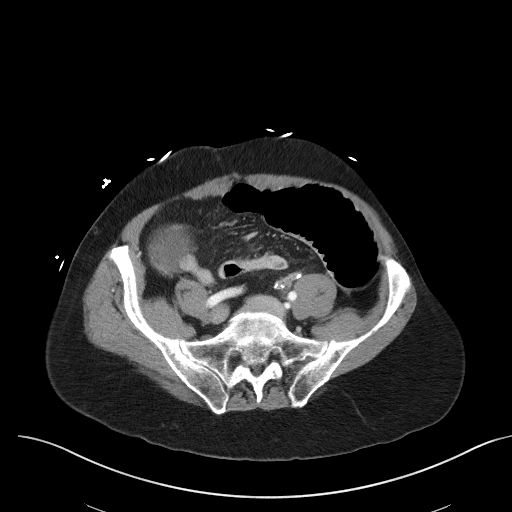
[im 40/91  soft-tissue]
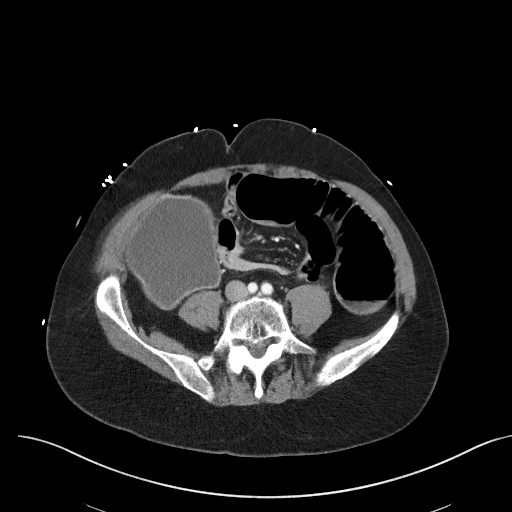
[im 46/91  soft-tissue]
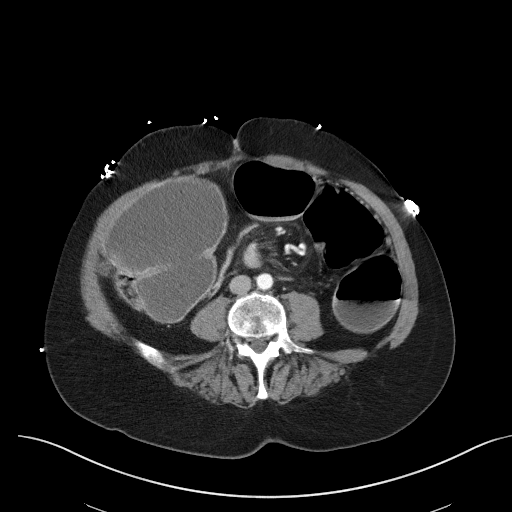
[im 51/91  soft-tissue]
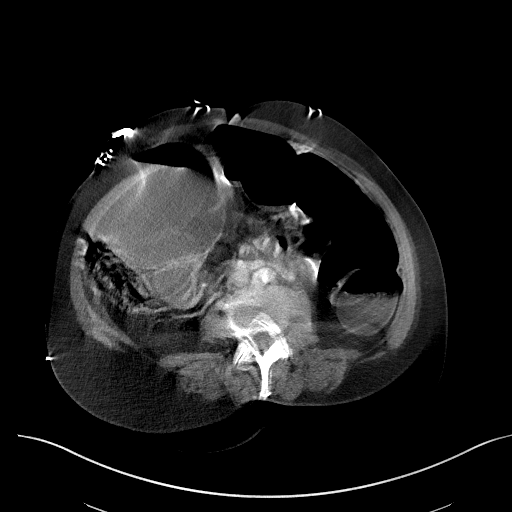
[im 57/91  soft-tissue]
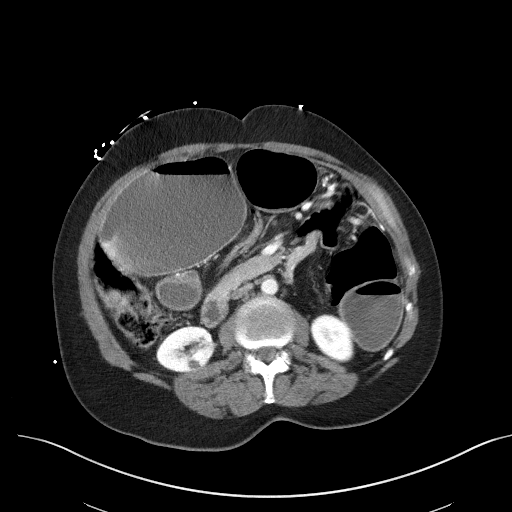
[im 57/91  bone]
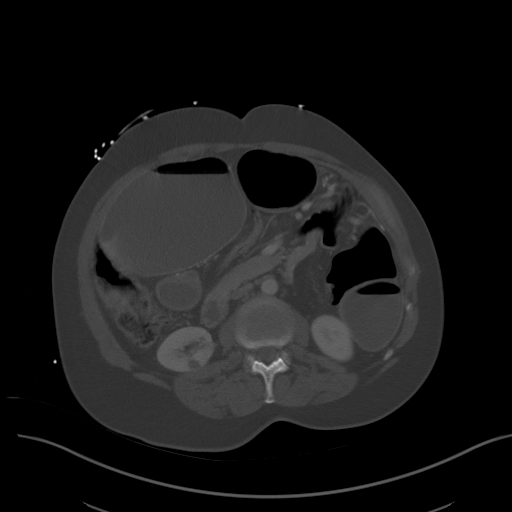
[im 62/91  soft-tissue]
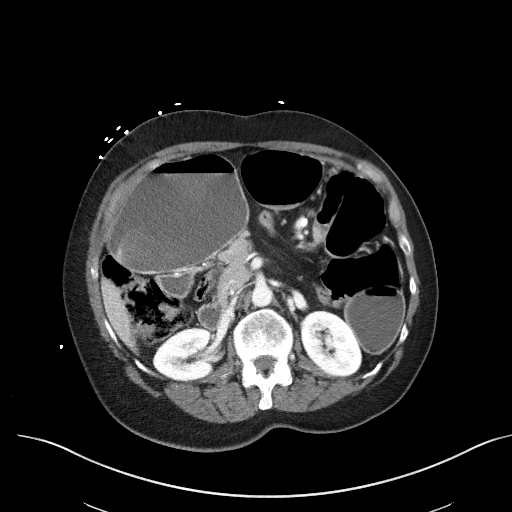
[im 74/91  soft-tissue]
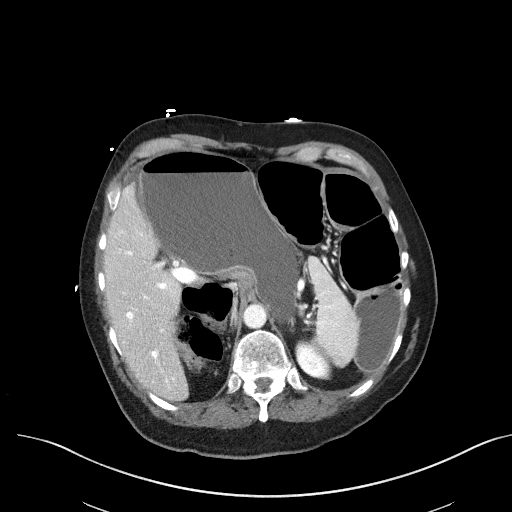
[im 79/91  soft-tissue]
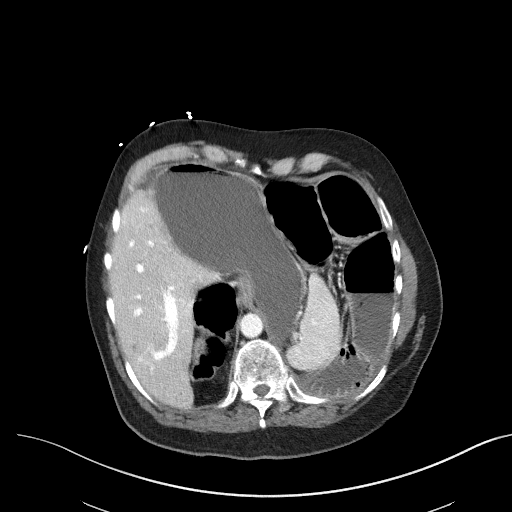
[im 85/91  soft-tissue]
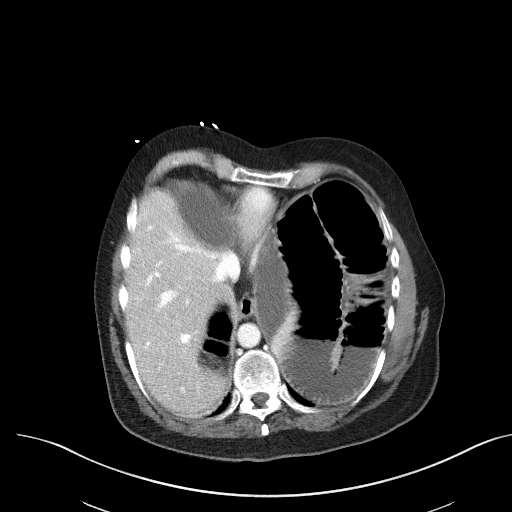

[Series 5: coronal st · coronal · 0.64mm/px · 3 of 95 slices shown]
[im 32/95  soft-tissue]
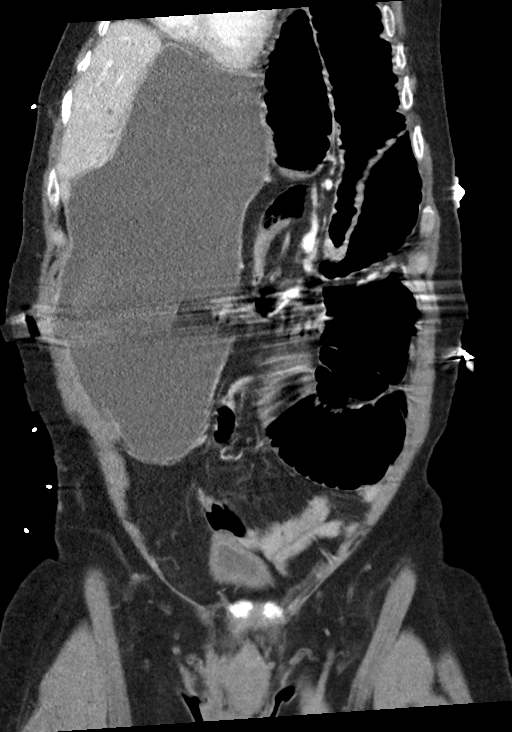
[im 42/95  soft-tissue]
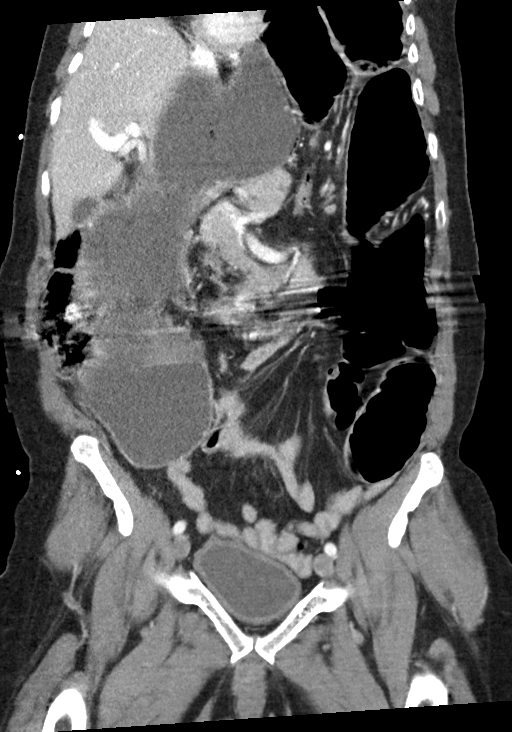
[im 53/95  soft-tissue]
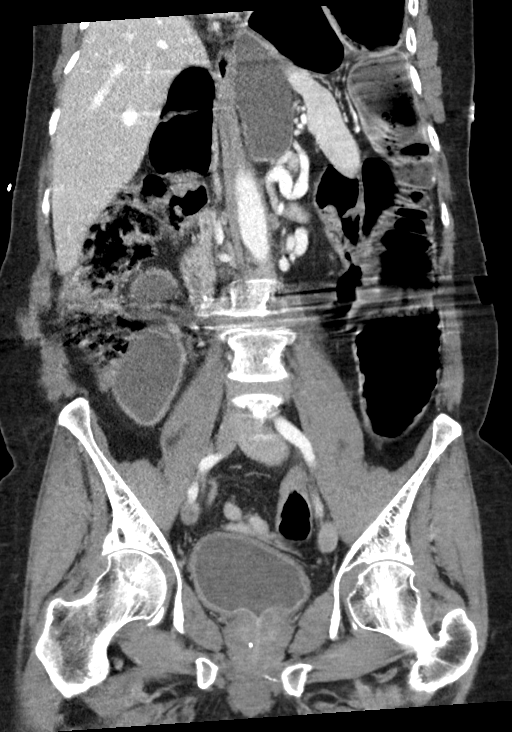

[16 of 46 positions shown; findings below may reference images not displayed]

FINDINGS: Lower chest: Mild atelectasis at the lung bases. Elevation of the
hemidiaphragms.

Hepatobiliary: No significant liver parenchymal finding. No
calcified gallstones.

Pancreas: Normal

Spleen: Normal

Adrenals/Urinary Tract: Adrenal glands are normal. Kidneys are
normal except for a small cyst at the lower pole on the right.
Bladder is normal.

Stomach/Bowel: Stomach is distended with fluid. There is high-grade
proximal small bowel obstruction, probably within the mid jejunum.
The small bowel is collapsed distal to that. Some persistent fecal
matter in the colon.

Vascular/Lymphatic: Minimal atherosclerotic change of the aorta. IVC
is normal. No retroperitoneal adenopathy.

Reproductive: Normal

Other: Small amount of free fluid in the pelvis.

Musculoskeletal: Motion degraded.  No abnormality seen.
IMPRESSION: High-grade small bowel obstruction, mid jejunal. No free air. Small
amount of free fluid.

## 2020-05-06 ENCOUNTER — Emergency Department: Payer: Medicare Other

## 2020-05-06 ENCOUNTER — Other Ambulatory Visit: Payer: Self-pay

## 2020-05-06 ENCOUNTER — Inpatient Hospital Stay
Admission: AD | Admit: 2020-05-06 | Discharge: 2020-05-10 | DRG: 389 | Disposition: A | Discharge status: Home / Self Care | Payer: Medicare Other | Source: Ambulatory Visit | Attending: Internal Medicine | Admitting: Internal Medicine

## 2020-05-06 DIAGNOSIS — R109 Unspecified abdominal pain: Secondary | ICD-10-CM

## 2020-05-06 DIAGNOSIS — B370 Candidal stomatitis: Secondary | ICD-10-CM

## 2020-05-06 DIAGNOSIS — R338 Other retention of urine: Secondary | ICD-10-CM

## 2020-05-06 DIAGNOSIS — G40909 Epilepsy, unspecified, not intractable, without status epilepticus: Secondary | ICD-10-CM | POA: Diagnosis present

## 2020-05-06 DIAGNOSIS — Z66 Do not resuscitate: Secondary | ICD-10-CM | POA: Diagnosis present

## 2020-05-06 DIAGNOSIS — K56609 Unspecified intestinal obstruction, unspecified as to partial versus complete obstruction: Secondary | ICD-10-CM

## 2020-05-06 DIAGNOSIS — Z79899 Other long term (current) drug therapy: Secondary | ICD-10-CM

## 2020-05-06 DIAGNOSIS — E876 Hypokalemia: Secondary | ICD-10-CM | POA: Diagnosis present

## 2020-05-06 DIAGNOSIS — I959 Hypotension, unspecified: Secondary | ICD-10-CM | POA: Diagnosis present

## 2020-05-06 DIAGNOSIS — Z79890 Hormone replacement therapy (postmenopausal): Secondary | ICD-10-CM | POA: Diagnosis not present

## 2020-05-06 DIAGNOSIS — N390 Urinary tract infection, site not specified: Secondary | ICD-10-CM

## 2020-05-06 DIAGNOSIS — K567 Ileus, unspecified: Principal | ICD-10-CM | POA: Diagnosis present

## 2020-05-06 DIAGNOSIS — Z20822 Contact with and (suspected) exposure to covid-19: Secondary | ICD-10-CM | POA: Diagnosis present

## 2020-05-06 DIAGNOSIS — F79 Unspecified intellectual disabilities: Secondary | ICD-10-CM | POA: Diagnosis present

## 2020-05-06 DIAGNOSIS — F419 Anxiety disorder, unspecified: Secondary | ICD-10-CM | POA: Diagnosis present

## 2020-05-06 LAB — CBC WITH DIFFERENTIAL/PLATELET
Abs Immature Granulocytes: 0.05 10*3/uL (ref 0.00–0.07)
Basophils Absolute: 0 10*3/uL (ref 0.0–0.1)
Basophils Relative: 0 %
Eosinophils Absolute: 0 10*3/uL (ref 0.0–0.5)
Eosinophils Relative: 0 %
HCT: 47.7 % (ref 39.0–52.0)
Hemoglobin: 15.5 g/dL (ref 13.0–17.0)
Immature Granulocytes: 0 %
Lymphocytes Relative: 5 %
Lymphs Abs: 0.7 10*3/uL (ref 0.7–4.0)
MCH: 29.5 pg (ref 26.0–34.0)
MCHC: 32.5 g/dL (ref 30.0–36.0)
MCV: 90.7 fL (ref 80.0–100.0)
Monocytes Absolute: 0.7 10*3/uL (ref 0.1–1.0)
Monocytes Relative: 5 %
Neutro Abs: 12.6 10*3/uL — ABNORMAL HIGH (ref 1.7–7.7)
Neutrophils Relative %: 90 %
Platelets: 219 10*3/uL (ref 150–400)
RBC: 5.26 MIL/uL (ref 4.22–5.81)
RDW: 12.7 % (ref 11.5–15.5)
WBC: 14.1 10*3/uL — ABNORMAL HIGH (ref 4.0–10.5)
nRBC: 0 % (ref 0.0–0.2)

## 2020-05-06 LAB — LIPASE, BLOOD: Lipase: 19 U/L (ref 11–51)

## 2020-05-06 LAB — COMPREHENSIVE METABOLIC PANEL
ALT: 25 U/L (ref 0–44)
AST: 33 U/L (ref 15–41)
Albumin: 5.1 g/dL — ABNORMAL HIGH (ref 3.5–5.0)
Alkaline Phosphatase: 60 U/L (ref 38–126)
Anion gap: 14 (ref 5–15)
BUN: 20 mg/dL (ref 8–23)
CO2: 28 mmol/L (ref 22–32)
Calcium: 10 mg/dL (ref 8.9–10.3)
Chloride: 104 mmol/L (ref 98–111)
Creatinine, Ser: 0.94 mg/dL (ref 0.61–1.24)
GFR calc Af Amer: >60 mL/min (ref >60)
GFR calc non Af Amer: >60 mL/min (ref >60)
Glucose, Bld: 136 mg/dL — ABNORMAL HIGH (ref 70–99)
Potassium: 3.9 mmol/L (ref 3.5–5.1)
Sodium: 146 mmol/L — ABNORMAL HIGH (ref 135–145)
Total Bilirubin: 0.9 mg/dL (ref 0.3–1.2)
Total Protein: 8.8 g/dL — ABNORMAL HIGH (ref 6.5–8.1)

## 2020-05-06 LAB — CBC
HCT: 47.4 % (ref 39.0–52.0)
Hemoglobin: 15.6 g/dL (ref 13.0–17.0)
MCH: 29.7 pg (ref 26.0–34.0)
MCHC: 32.9 g/dL (ref 30.0–36.0)
MCV: 90.3 fL (ref 80.0–100.0)
Platelets: 158 10*3/uL (ref 150–400)
RBC: 5.25 MIL/uL (ref 4.22–5.81)
RDW: 12.7 % (ref 11.5–15.5)
WBC: 14 10*3/uL — ABNORMAL HIGH (ref 4.0–10.5)
nRBC: 0 % (ref 0.0–0.2)

## 2020-05-06 LAB — SARS CORONAVIRUS 2 BY RT PCR (HOSPITAL ORDER, PERFORMED IN ~~LOC~~ HOSPITAL LAB): SARS Coronavirus 2: NEGATIVE

## 2020-05-06 MED ORDER — ONDANSETRON HCL 4 MG/2ML IJ SOLN: 4.0000 mg | Freq: Four times a day (QID) | INTRAMUSCULAR | Status: DC | PRN

## 2020-05-06 MED ORDER — IOHEXOL 300 MG/ML  SOLN
100.0000 mL | Freq: Once | INTRAMUSCULAR | Status: AC | PRN
  Administered 2020-05-06: 100 mL via INTRAVENOUS

## 2020-05-06 MED ORDER — SODIUM CHLORIDE 0.9 % IV SOLN
INTRAVENOUS | Status: DC
Start: 2020-05-06 — End: 2020-05-06

## 2020-05-06 MED ORDER — LEVETIRACETAM IN NACL 500 MG/100ML IV SOLN
500.0000 mg | Freq: Two times a day (BID) | INTRAVENOUS | Status: DC
  Administered 2020-05-06 – 2020-05-10 (×8): 500 mg via INTRAVENOUS
  Filled 2020-05-06 (×11): qty 100

## 2020-05-06 MED ORDER — MORPHINE SULFATE (PF) 2 MG/ML IV SOLN: 2.0000 mg | INTRAVENOUS | Status: DC | PRN

## 2020-05-06 MED ORDER — DEXTROSE-NACL 5-0.45 % IV SOLN: INTRAVENOUS | Status: DC

## 2020-05-06 MED ORDER — SODIUM CHLORIDE 0.9% FLUSH: 3.0000 mL | Freq: Once | INTRAVENOUS | Status: DC

## 2020-05-06 MED ORDER — SODIUM CHLORIDE 0.9 % IV SOLN
1.0000 g | INTRAVENOUS | Status: DC
  Administered 2020-05-06: 1 g via INTRAVENOUS
  Filled 2020-05-06 (×2): qty 10

## 2020-05-06 MED ORDER — ONDANSETRON HCL 4 MG/2ML IJ SOLN
4.0000 mg | Freq: Once | INTRAMUSCULAR | Status: AC
  Administered 2020-05-06: 4 mg via INTRAVENOUS
  Filled 2020-05-06: qty 2

## 2020-05-06 MED ORDER — ONDANSETRON HCL 4 MG PO TABS: 4.0000 mg | ORAL_TABLET | Freq: Four times a day (QID) | ORAL | Status: DC | PRN

## 2020-05-06 MED ORDER — MORPHINE SULFATE (PF) 4 MG/ML IV SOLN
4.0000 mg | Freq: Once | INTRAVENOUS | Status: AC
  Administered 2020-05-06: 4 mg via INTRAVENOUS
  Filled 2020-05-06: qty 1

## 2020-05-06 MED ORDER — SODIUM CHLORIDE 0.9 % IV SOLN: Freq: Once | INTRAVENOUS | Status: AC

## 2020-05-06 MED ORDER — LORAZEPAM 2 MG/ML IJ SOLN: 2.0000 mg | Freq: Four times a day (QID) | INTRAMUSCULAR | Status: DC | PRN

## 2020-05-06 MED ORDER — PANTOPRAZOLE SODIUM 40 MG IV SOLR
40.0000 mg | INTRAVENOUS | Status: DC
  Administered 2020-05-06 – 2020-05-09 (×4): 40 mg via INTRAVENOUS
  Filled 2020-05-06 (×4): qty 40

## 2020-05-06 NOTE — H&P (Signed)
History and Physical    Richard Gillespie CWC:[id number] DOB: 11-Mar-1951 DOA: 05/06/2020  PCP: Gracelyn Nurse, MD   Patient coming from: Home  I have personally briefly reviewed patient's old medical records in [facility]  Chief Complaint: Nausea/vomiting History obtained from patient's caregiver.  Patient is nonverbal.  HPI: Richard Gillespie is a 69 y.o. male with medical history significant for mental retardation, seizure disorder and anxiety who was brought to the ER by his caregiver for evaluation of nausea and vomiting which has been ongoing for about 2 days.  Patient was seen by his primary care provider 1 day prior to his admission for evaluation of emesis, he had some lab work done and a urine analysis which revealed pyuria.  Patient was discharged home on antibiotic therapy.  She decided to bring him back to the emergency room because of persistent emesis which she said normally happens when he has a bowel obstruction.  She also states that patient gets very restless when this happens due to discomfort/pain.  Patient's last bowel movement was this morning and she states that it is usually loose Patient has had prior abdominal surgery with the last one  in September 2020. I am unable to do review of systems on this patient since he is nonverbal. Patient had a CT scan of abdomen pelvis which showed high-grade small bowel obstruction. This could be due to each adhesions or an internal hernia based on the findings above.Small to moderate-sized bilateral inguinal hernias. A portion of the urinary bladder is extending into the proximal portion of the inguinal hernia on the right without obstruction. Calcified coronary artery atherosclerosis. Twelve-lead EKG shows normal sinus rhythm with a left bundle branch block  ED Course: Patient is a 70 year old male with a history of mental retardation, seizure disorder and anxiety who was brought into the emergency room by his caregiver for evaluation  of refractory emesis.  Imaging shows a high-grade small bowel obstruction.  Surgery was consulted in the emergency room and patient will be admitted to the hospital for further evaluation.  Review of Systems: As per HPI otherwise 10 point review of systems negative.    Past Medical History:  Diagnosis Date  . Mental retardation   . Seizures (HCC)     Past Surgical History:  Procedure Laterality Date  . COLON SURGERY    . LAPAROTOMY N/A 08/05/2019   Procedure: EXPLORATORY LAPAROTOMY;  Surgeon: Carolan Shiver, MD;  Location: [facility] ORS;  Service: General;  Laterality: N/A;     reports that he has never smoked. He has never used smokeless tobacco. He reports that he does not drink alcohol and does not use drugs.  Allergies  Allergen Reactions  . Zithromax [Azithromycin] Other (See Comments)    unknown    No family history on file.   Prior to Admission medications   Medication Sig Start Date End Date Taking? Authorizing Provider  bisacodyl (DULCOLAX) 10 MG suppository Place 10 mg rectally as needed for moderate constipation.    [provider]  carbamazepine (TEGRETOL XR) 200 MG 12 hr tablet Take 200 mg by mouth 4 (four) times daily.     [provider]  feeding supplement, ENSURE ENLIVE, (ENSURE ENLIVE) LIQD Take 237 mLs by mouth 2 (two) times daily between meals. Patient not taking: Reported on 09/12/2018 01/16/17   Katharina Caper, MD  lactulose (CHRONULAC) 10 GM/15ML solution Take 10 g by mouth 2 (two) times daily as needed for mild constipation.  [provider]  lamoTRIgine (LAMICTAL) 150 MG tablet Take 75 mg by mouth 2 (two) times daily.     [provider]  levETIRAcetam (KEPPRA) 500 MG tablet Take 1 tablet (500 mg total) by mouth 2 (two) times daily. 09/24/18   Mayo, Allyn Kenner, MD  loratadine (CLARITIN) 10 MG tablet Take 10 mg by mouth daily.    [provider]  LORazepam (ATIVAN) 0.5 MG tablet Take 0.5 mg by mouth as needed  for anxiety (max 4 tablets daily).     [provider]  magnesium citrate SOLN Take 1 Bottle by mouth daily as needed for severe constipation (at least 2 days of no BM).     [provider]  medroxyPROGESTERone (PROVERA) 10 MG tablet Take 10 mg by mouth daily.    [provider]  Multiple Vitamin (THEREMS) TABS Take 1 tablet by mouth daily.    [provider]  mupirocin nasal ointment (BACTROBAN) 2 % Place 1 application into the nose 2 (two) times daily. Use one-half of tube in each nostril twice daily for five (5) days. After application, press sides of nose together and gently massage.    [provider]  polyethylene glycol (MIRALAX / GLYCOLAX) packet Take 17 g by mouth daily as needed for mild constipation or moderate constipation.     [provider]  senna (SENOKOT) 8.6 MG TABS tablet Take 2 tablets by mouth 2 (two) times daily.     [provider]  traZODone (DESYREL) 150 MG tablet Take 300 mg by mouth at bedtime.    [provider]    Physical Exam: Vitals:   05/06/20 1341 05/06/20 1342 05/06/20 1400 05/06/20 1430  BP:   (!) 179/94   Pulse: 77 76 77 71  Resp:   (!) 23 16  Temp:      SpO2:  98% 97% 92%  Weight:      Height:         Vitals:   05/06/20 1341 05/06/20 1342 05/06/20 1400 05/06/20 1430  BP:   (!) 179/94   Pulse: 77 76 77 71  Resp:   (!) 23 16  Temp:      SpO2:  98% 97% 92%  Weight:      Height:        Constitutional: NAD, awake and alert Eyes: PERRL, lids and conjunctivae normal ENMT: Mucous membranes are dry.  Neck: normal, supple, no masses, no thyromegaly Respiratory: clear to auscultation bilaterally, no wheezing, no crackles. Normal respiratory effort. No accessory muscle use.  Cardiovascular: Regular rate and rhythm, no murmurs / rubs / gallops. No extremity edema. 2+ pedal pulses. No carotid bruits.  Abdomen: no definite tenderness, no masses palpated. No hepatosplenomegaly.   Hypoactive bowel sounds positive.  Musculoskeletal: no clubbing / cyanosis. No joint deformity upper and lower extremities.  Skin: no rashes, lesions, ulcers.  Neurologic: No gross focal neurologic deficit. Psychiatric: Normal mood and affect.   Labs on Admission: I have personally reviewed following labs and imaging studies  CBC: Recent Labs  Lab 05/06/20 1117  WBC 14.1*  14.0*  NEUTROABS 12.6*  HGB 15.5  15.6  HCT 47.7  47.4  MCV 90.7  90.3  PLT 219  158   Basic Metabolic Panel: Recent Labs  Lab 05/06/20 1117  NA 146*  K 3.9  CL 104  CO2 28  GLUCOSE 136*  BUN 20  CREATININE 0.94  CALCIUM 10.0   GFR: Estimated Creatinine Clearance: 70 mL/min (by C-G  formula based on SCr of 0.94 mg/dL). Liver Function Tests: Recent Labs  Lab 05/06/20 1117  AST 33  ALT 25  ALKPHOS 60  BILITOT 0.9  PROT 8.8*  ALBUMIN 5.1*   Recent Labs  Lab 05/06/20 1117  LIPASE 19   No results for input(s): AMMONIA in the last 168 hours. Coagulation Profile: No results for input(s): INR, PROTIME in the last 168 hours. Cardiac Enzymes: No results for input(s): CKTOTAL, CKMB, CKMBINDEX, TROPONINI in the last 168 hours. BNP (last 3 results) No results for input(s): PROBNP in the last 8760 hours. HbA1C: No results for input(s): HGBA1C in the last 72 hours. CBG: No results for input(s): GLUCAP in the last 168 hours. Lipid Profile: No results for input(s): CHOL, HDL, LDLCALC, TRIG, CHOLHDL, LDLDIRECT in the last 72 hours. Thyroid Function Tests: No results for input(s): TSH, T4TOTAL, FREET4, T3FREE, THYROIDAB in the last 72 hours. Anemia Panel: No results for input(s): VITAMINB12, FOLATE, FERRITIN, TIBC, IRON, RETICCTPCT in the last 72 hours. Urine analysis:    Component Value Date/Time   COLORURINE AMBER (A) 08/10/2019 2000   APPEARANCEUR HAZY (A) 08/10/2019 2000   LABSPEC 1.030 08/10/2019 2000   PHURINE 5.0 08/10/2019 2000   GLUCOSEU NEGATIVE 08/10/2019 2000   HGBUR NEGATIVE  08/10/2019 2000   BILIRUBINUR NEGATIVE 08/10/2019 2000   KETONESUR 5 (A) 08/10/2019 2000   PROTEINUR 30 (A) 08/10/2019 2000   NITRITE NEGATIVE 08/10/2019 2000   LEUKOCYTESUR NEGATIVE 08/10/2019 2000    Radiological Exams on Admission: CT ABDOMEN PELVIS W CONTRAST  Result Date: 05/06/2020 CLINICAL DATA:  Vomiting since yesterday. Abdomen radiographs earlier today showing small-bowel obstruction. History of small-bowel obstruction. Mental retardation. EXAM: CT ABDOMEN AND PELVIS WITH CONTRAST TECHNIQUE: Multidetector CT imaging of the abdomen and pelvis was performed using the standard protocol following bolus administration of intravenous contrast. CONTRAST:  137mL OMNIPAQUE IOHEXOL 300 MG/ML  SOLN COMPARISON:  08/03/2019. Abdomen radiographs obtained earlier today. FINDINGS: Lower chest: Stable mild chronic atelectasis or scarring in the dependent portion of the right lung. The patient on his right side as on the previous examination. Coronary artery calcifications. Hepatobiliary: No focal liver abnormality is seen. No gallstones, gallbladder wall thickening, or biliary dilatation. Pancreas: Unremarkable. No pancreatic ductal dilatation or surrounding inflammatory changes. Spleen: Normal in size without focal abnormality. Adrenals/Urinary Tract: Normal appearing adrenal glands. Small bilateral renal cysts. Unremarkable ureters and urinary bladder except for extension of a portion of the urinary bladder into a right inguinal hernia. Stomach/Bowel: Again demonstrated is marked dilatation of multiple small bowel loops. There is mild swirling of the mesentery with improvement. Within the area of swirling mesentery, there are 2 loops of small bowel entering that area which are dilated prior to entering this area normal in caliber distally. No colonic dilatation is seen. Multiple central mesenteric surgical clips are demonstrated. The rectum is dilated. Vascular/Lymphatic: No significant vascular findings are  present in the abdomen or pelvis. No enlarged abdominal or pelvic lymph nodes. Reproductive: Mildly enlarged and mildly heterogeneous prostate gland. Other: Midline surgical scars. Small to moderate-sized bilateral inguinal hernias. Portion of the urinary bladder is extending into the proximal portion of the inguinal hernia on the right and there is fat in the inguinal hernia on left. Very tiny amount of free peritoneal fluid anterior to the liver on the right. No free peritoneal air seen. No pneumatosis intestinalis demonstrated. Musculoskeletal: Mild thoracolumbar spine scoliosis and degenerative changes. IMPRESSION: 1. High-grade small bowel obstruction. This could be due to each adhesions or an  internal hernia based on the findings above. 2. Very tiny amount of free peritoneal fluid anterior to the liver on the right. 3. Small to moderate-sized bilateral inguinal hernias. A portion of the urinary bladder is extending into the proximal portion of the inguinal hernia on the right without obstruction. 4. Calcified coronary artery atherosclerosis. Electronically Signed   By: Beckie Salts M.D.   On: 05/06/2020 15:02   DG Abd 2 Views  Result Date: 05/06/2020 CLINICAL DATA:  Abdominal pain EXAM: ABDOMEN - 2 VIEW COMPARISON:  August 08, 2019 FINDINGS: Supine and upright images obtained. There is extensive small bowel dilatation throughout most of the abdomen without appreciable air-fluid levels. No free air evident. There is elevation of the left hemidiaphragm. Lung bases are clear. No abnormal calcifications. IMPRESSION: Diffuse small bowel dilatation. Suspect a degree of bowel obstruction. Fairly severe ileus could present similarly. No free air. Visualized lung bases clear. Electronically Signed   By: Bretta Bang III M.D.   On: 05/06/2020 12:52    EKG: Independently reviewed.  Normal sinus rhythm LBBB  Assessment/Plan Principal Problem:   SBO (small bowel obstruction) (HCC) Active Problems:    UTI (urinary tract infection)   Seizure disorder (HCC)    Small bowel obstruction Most likely secondary to adhesions Patient has had prior abdominal surgery He presents for evaluation of abdominal pain associated with refractory emesis CT scan of the abdomen and pelvis shows a high-grade small bowel obstruction. This could be due to each adhesions or an internal hernia based on the findings above. We will keep patient n.p.o. We will insert NG tube if emesis remains refractory IV fluid hydration, IV PPI, pain control   Urinary tract infection Patient with pyuria (UA done at [facility] on 05/05/20) We will place patient empirically on Rocephin 1 g IV daily until urine culture results become available   Seizure Disorder Patient has a history of seizure disorder and is on Keppra, Lamictal and Tegretol We will place patient on Keppra IV 500 mg every 12 Place patient on seizure precautions   Anxiety disorder We will place patient on IV Ativan as needed for anxiety  DVT prophylaxis: SCD Code Status: Full code Family Communication: Greater than 50% of time was spent discussing plan of care with patient's caregiver at the bedside and his brother Read Bonelli over the phone.   All questions and concerns have been addressed.  CODE STATUS was discussed with his brother and he is a DO NOT RESUSCITATE Disposition Plan: Back to previous home environment Consults called: Surgery     Laverna Dossett MD [facility]     05/06/2020, 3:26 PM

## 2020-05-06 NOTE — ED Notes (Signed)
Pt resting calmly in room. Bed locked low. Rails up. Call bell within reach. Caregiver at bedside.

## 2020-05-06 NOTE — ED Notes (Signed)
Pt laying in bed. No n/v observed. Pt resting.

## 2020-05-06 NOTE — ED Triage Notes (Signed)
Pt comes via POV with caregiver with c/o abdominal pain. Pt started throwing up yesterday. Pt taken to UC and xray completed.  Xray revealed blockage per caregiver. Caregiver reports hx of this. Pt is unable to sit still.  Caregiver reports N/V/D

## 2020-05-06 NOTE — Consult Note (Signed)
SURGICAL CONSULTATION NOTE   HISTORY OF PRESENT ILLNESS (HPI):  70 y.o. male presented to [facility] ED for evaluation of nausea and vomiting seems 2 days ago.  Patient with significant mental retardation and seizure disorder, nonverbal.  History taken by caregiver at bedside.  Caregiver reported the patient has been vomiting since 2 days ago.  He was seen by his PCP who ordered labs yesterday.  Labs shows UTI.  He was started on antibiotic therapy.  He continued having some vomiting yesterday and she decided to take him to the ED for further evaluation.  At the ED x-ray of the abdomen was done that shows small bowel and left colon dilation.  I personally evaluated the images.  This is the same image since many years ago.  He even had a x-ray done on 2018 after a port placement that shows dilation of the left colon with elevated hemidiaphragm.  The caregiver reported the patient had a bowel movement this morning.  No nausea or vomiting at this moment.  The patient is very cooperative and in no distress.  Does not seem to be uncomfortable.  Surgery is consulted by Dr. Darnelle Catalan in this context for evaluation and management of suspected bowel obstruction.  PAST MEDICAL HISTORY (PMH):  Past Medical History:  Diagnosis Date  . Mental retardation   . Seizures (HCC)      PAST SURGICAL HISTORY (PSH):  Past Surgical History:  Procedure Laterality Date  . COLON SURGERY    . LAPAROTOMY N/A 08/05/2019   Procedure: EXPLORATORY LAPAROTOMY;  Surgeon: Carolan Shiver, MD;  Location: [facility] ORS;  Service: General;  Laterality: N/A;     MEDICATIONS:  Prior to Admission medications   Medication Sig Start Date End Date Taking? Authorizing Provider  bisacodyl (DULCOLAX) 10 MG suppository Place 10 mg rectally as needed for moderate constipation.    [provider]  carbamazepine (TEGRETOL XR) 200 MG 12 hr tablet Take 200 mg by mouth 4 (four) times daily.     [provider]  feeding supplement,  ENSURE ENLIVE, (ENSURE ENLIVE) LIQD Take 237 mLs by mouth 2 (two) times daily between meals. Patient not taking: Reported on 09/12/2018 01/16/17   Katharina Caper, MD  lactulose (CHRONULAC) 10 GM/15ML solution Take 10 g by mouth 2 (two) times daily as needed for mild constipation.     [provider]  lamoTRIgine (LAMICTAL) 150 MG tablet Take 75 mg by mouth 2 (two) times daily.     [provider]  levETIRAcetam (KEPPRA) 500 MG tablet Take 1 tablet (500 mg total) by mouth 2 (two) times daily. 09/24/18   Mayo, Allyn Kenner, MD  loratadine (CLARITIN) 10 MG tablet Take 10 mg by mouth daily.    [provider]  LORazepam (ATIVAN) 0.5 MG tablet Take 0.5 mg by mouth as needed for anxiety (max 4 tablets daily).     [provider]  magnesium citrate SOLN Take 1 Bottle by mouth daily as needed for severe constipation (at least 2 days of no BM).     [provider]  medroxyPROGESTERone (PROVERA) 10 MG tablet Take 10 mg by mouth daily.    [provider]  Multiple Vitamin (THEREMS) TABS Take 1 tablet by mouth daily.    [provider]  mupirocin nasal ointment (BACTROBAN) 2 % Place 1 application into the nose 2 (two) times daily. Use one-half of tube in each nostril twice daily for five (5) days. After application, press sides of nose together and gently massage.  [provider]  polyethylene glycol (MIRALAX / GLYCOLAX) packet Take 17 g by mouth daily as needed for mild constipation or moderate constipation.     [provider]  senna (SENOKOT) 8.6 MG TABS tablet Take 2 tablets by mouth 2 (two) times daily.     [provider]  traZODone (DESYREL) 150 MG tablet Take 300 mg by mouth at bedtime.    [provider]     ALLERGIES:  Allergies  Allergen Reactions  . Zithromax [Azithromycin] Other (See Comments)    unknown     SOCIAL HISTORY:  Social History   Socioeconomic History  . Marital status: Single     Spouse name: Not on file  . Number of children: Not on file  . Years of education: Not on file  . Highest education level: Not on file  Occupational History  . Not on file  Tobacco Use  . Smoking status: Never Smoker  . Smokeless tobacco: Never Used  Substance and Sexual Activity  . Alcohol use: No  . Drug use: Never  . Sexual activity: Not on file  Other Topics Concern  . Not on file  Social History Narrative  . Not on file   Social Determinants of Health   Financial Resource Strain:   . Difficulty of Paying Living Expenses:   Food Insecurity:   . Worried About Charity fundraiser in the Last Year:   . Arboriculturist in the Last Year:   Transportation Needs:   . Film/video editor (Medical):   Marland Kitchen Lack of Transportation (Non-Medical):   Physical Activity:   . Days of Exercise per Week:   . Minutes of Exercise per Session:   Stress:   . Feeling of Stress :   Social Connections:   . Frequency of Communication with Friends and Family:   . Frequency of Social Gatherings with Friends and Family:   . Attends Religious Services:   . Active Member of Clubs or Organizations:   . Attends Archivist Meetings:   Marland Kitchen Marital Status:   Intimate Partner Violence:   . Fear of Current or Ex-Partner:   . Emotionally Abused:   Marland Kitchen Physically Abused:   . Sexually Abused:       FAMILY HISTORY:  No family history on file.   REVIEW OF SYSTEMS: Taken from caregiver Constitutional: denies weight loss, fever, chills, or sweats  Eyes: denies any other vision changes, history of eye injury  ENT: denies sore throat, hearing problems  Respiratory: denies shortness of breath, wheezing  Cardiovascular: denies chest pain, palpitations  Gastrointestinal: No abdominal pain, positive for nausea and vomiting Genitourinary: denies burning with urination or urinary frequency Musculoskeletal: denies any other joint pains or cramps  Skin: denies any other rashes or skin discolorations   Neurological: denies any other headache, dizziness, weakness  Psychiatric: denies any other depression, anxiety   All other review of systems were negative   VITAL SIGNS:  Temp:  [97.5 F (36.4 C)] 97.5 F (36.4 C) (06/11 1113) Pulse Rate:  [40-81] 76 (06/11 1342) Resp:  [18] 18 (06/11 1113) BP: (130-158)/(85-101) 158/101 (06/11 1330) SpO2:  [84 %-100 %] 98 % (06/11 1342) Weight:  [65.8 kg] 65.8 kg (06/11 1114)     Height: 5\' 8"  (172.7 cm) Weight: 65.8 kg BMI (Calculated): 22.05   INTAKE/OUTPUT:  This shift: No intake/output data recorded.  Last 2 shifts: @IOLAST2SHIFTS @   PHYSICAL EXAM:  Constitutional:  -- Normal body habitus  --  Awake, alert, cooperative Eyes:  -- Pupils equally round and reactive to light  -- No scleral icterus  Ear, nose, and throat:  -- No jugular venous distension  Pulmonary:  -- No crackles  -- Equal breath sounds bilaterally -- Breathing non-labored at rest Cardiovascular:  -- S1, S2 present  -- No pericardial rubs Gastrointestinal:  -- Abdomen soft, nontender, distended, no guarding or rebound tenderness -- No abdominal masses appreciated, pulsatile or otherwise  Musculoskeletal and Integumentary:  -- Wounds: None appreciated -- Extremities: B/L UE and LE FROM, hands and feet warm, no edema  Neurologic:  -- Motor function: intact and symmetric -- Sensation: intact and symmetric   Labs:  CBC Latest Ref Rng & Units 05/06/2020 05/06/2020 08/17/2019  WBC 4.0 - 10.5 K/uL 14.1(H) 14.0(H) 15.3(H)  Hemoglobin 13.0 - 17.0 g/dL 97.3 53.2 99.2  Hematocrit 39 - 52 % 47.7 47.4 44.4  Platelets 150 - 400 K/uL 219 158 380   CMP Latest Ref Rng & Units 05/06/2020 08/17/2019 08/15/2019  Glucose 70 - 99 mg/dL 426(S) 341(D) -  BUN 8 - 23 mg/dL 20 17 -  Creatinine 6.22 - 1.24 mg/dL 2.97 9.89 -  Sodium 211 - 145 mmol/L 146(H) 139 -  Potassium 3.5 - 5.1 mmol/L 3.9 4.0 3.8  Chloride 98 - 111 mmol/L 104 104 -  CO2 22 - 32 mmol/L 28 23 -  Calcium 8.9 - 10.3  mg/dL 94.1 8.9 -  Total Protein 6.5 - 8.1 g/dL 7.4(Y) - -  Total Bilirubin 0.3 - 1.2 mg/dL 0.9 - -  Alkaline Phos 38 - 126 U/L 60 - -  AST 15 - 41 U/L 33 - -  ALT 0 - 44 U/L 25 - -    Imaging studies:  I personally evaluated the abdominal x-ray that shows large and small intestine dilation.  I also evaluated the CT scan.  There is no free air.  There is significant small large bowel dilation.  Assessment/Plan:  68 y.o. male with UTI, complicated by pertinent comorbidities including generalized small and large bowel ileus, versus obstruction.  Patient with previous episode of bowel obstruction.  Today present with urinalysis with nitrate, white blood cell count and leukocyte esterase from yesterday.  I evaluated the images from 2018, 2019 and present in all of them looks similar.  Patient with ileus exacerbated due to UTI over most likely chronic functional issue of his GI tract.  Since the patient is having bowel movement and seems comfortable without being nauseous at this moment I think that he can continue being treated with conservative management with antimedic medications.  If he does develop vomiting he will need an nasogastric tube placement.  I will appreciate hospitalist admission for management of UTI and medical comorbidities and general surgery will continue following his ileus.  I discussed this recommendation to the caregiver at bedside.  Gae Gallop, MD

## 2020-05-06 NOTE — ED Notes (Addendum)
Called Huel Centola legal guardian and brother of pt and updated on pt's status.

## 2020-05-06 NOTE — Progress Notes (Signed)
Pt just changed clothes, reason for resp elevated

## 2020-05-06 NOTE — ED Notes (Signed)
Called floor to give report. Gave ascom number and name.

## 2020-05-06 NOTE — ED Provider Notes (Addendum)
[facility] Emergency Department Provider Note   ____________________________________________   First MD Initiated Contact with Patient 05/06/20 1238     (approximate)  I have reviewed the triage vital signs and the nursing notes.   HISTORY  Chief Complaint Abdominal Pain    HPI Richard Gillespie is a 69 y.o. male patient with a history of small bowel obstruction started vomiting yesterday had a x-ray done in the office today apparently that showed a small bowel obstruction patient came in here.  Review of the x-ray does show a bowel obstruction.  Patient is not currently vomiting.  He complains of pain in his belly.  Review of his old records show he had lysis of adhesions September of last year for nonresolving small bowel obstruction.         Past Medical History:  Diagnosis Date  . Mental retardation   . Seizures [facility])     Patient Active Problem List   Diagnosis Date Noted  . Small bowel obstruction ([place]) 08/03/2019  . Malnutrition of moderate degree 09/17/2018  . SBO (small bowel obstruction) ([place]) 09/12/2018  . Aspiration pneumonia ([place]) 09/12/2018  . Seizure disorder ([place]) 09/12/2018  . Sepsis ([place]) 09/12/2018  . E coli infection 01/15/2017  . Agitation 01/15/2017  . Diarrhea 01/15/2017  . Hypokalemia 01/15/2017  . Hyponatremia 01/15/2017  . Leukocytosis 01/15/2017  . Hyperglycemia 01/15/2017  . UTI (urinary tract infection) 01/12/2017    Past Surgical History:  Procedure Laterality Date  . COLON SURGERY    . LAPAROTOMY N/A 08/05/2019   Procedure: EXPLORATORY LAPAROTOMY;  Surgeon: Herbert Pun, MD;  Location: [facility] ORS;  Service: General;  Laterality: N/A;    Prior to Admission medications   Medication Sig Start Date End Date Taking? Authorizing Provider  bisacodyl (DULCOLAX) 10 MG suppository Place 10 mg rectally as needed for moderate constipation.    [provider]  carbamazepine (TEGRETOL XR) 200 MG 12 hr  tablet Take 200 mg by mouth 4 (four) times daily.     [provider]  feeding supplement, ENSURE ENLIVE, (ENSURE ENLIVE) LIQD Take 237 mLs by mouth 2 (two) times daily between meals. Patient not taking: Reported on 09/12/2018 01/16/17   Theodoro Grist, MD  lactulose (CHRONULAC) 10 GM/15ML solution Take 10 g by mouth 2 (two) times daily as needed for mild constipation.     [provider]  lamoTRIgine (LAMICTAL) 150 MG tablet Take 75 mg by mouth 2 (two) times daily.     [provider]  levETIRAcetam (KEPPRA) 500 MG tablet Take 1 tablet (500 mg total) by mouth 2 (two) times daily. 09/24/18   Mayo, Pete Pelt, MD  loratadine (CLARITIN) 10 MG tablet Take 10 mg by mouth daily.    [provider]  LORazepam (ATIVAN) 0.5 MG tablet Take 0.5 mg by mouth as needed for anxiety (max 4 tablets daily).     [provider]  magnesium citrate SOLN Take 1 Bottle by mouth daily as needed for severe constipation (at least 2 days of no BM).     [provider]  medroxyPROGESTERone (PROVERA) 10 MG tablet Take 10 mg by mouth daily.    [provider]  Multiple Vitamin (THEREMS) TABS Take 1 tablet by mouth daily.    [provider]  mupirocin nasal ointment (BACTROBAN) 2 % Place 1 application into the nose 2 (two) times daily. Use one-half of tube in each nostril twice daily for five (5) days. After application, press sides of  nose together and gently massage.    [provider]  polyethylene glycol (MIRALAX / GLYCOLAX) packet Take 17 g by mouth daily as needed for mild constipation or moderate constipation.     [provider]  senna (SENOKOT) 8.6 MG TABS tablet Take 2 tablets by mouth 2 (two) times daily.     [provider]  traZODone (DESYREL) 150 MG tablet Take 300 mg by mouth at bedtime.    [provider]    Allergies Zithromax [azithromycin]  No family history on file.  Social History Social History    Tobacco Use  . Smoking status: Never Smoker  . Smokeless tobacco: Never Used  Substance Use Topics  . Alcohol use: No  . Drug use: Never    Review of Systems Unobtainable due to mental retardation  ____________________________________________   PHYSICAL EXAM:  VITAL SIGNS: ED Triage Vitals  Enc Vitals Group     BP 05/06/20 1113 130/85     Pulse Rate 05/06/20 1113 81     Resp 05/06/20 1113 18     Temp 05/06/20 1113 (!) 97.5 F (36.4 C)     Temp src --      SpO2 05/06/20 1113 100 %     Weight 05/06/20 1114 145 lb (65.8 kg)     Height 05/06/20 1114 5\' 8"  (1.727 m)     Head Circumference --      Peak Flow --      Pain Score --      Pain Loc --      Pain Edu? --      Excl. in GC? --     Constitutional: Alert and in pain complaining his stomach hurts Eyes: Conjunctivae are normal.  Head: Atraumatic. Nose: No congestion/rhinnorhea. Mouth/Throat: Mucous membranes are moist.  Neck: No stridor.   Cardiovascular: Normal rate, regular rhythm. Grossly normal heart sounds.  Good peripheral circulation. Respiratory: Normal respiratory effort.  No retractions. Lungs CTAB. Gastrointestinal: Stomach is slightly distended diffusely tender very few bowel sounds no abdominal bruits.  Musculoskeletal: No lower extremity tenderness nor edema.  No joint effusions. Neurologic: At baseline Skin:  Skin is warm, dry and intact. No rash noted.   ____________________________________________   LABS (all labs ordered are listed, but only abnormal results are displayed)  Labs Reviewed  COMPREHENSIVE METABOLIC PANEL - Abnormal; Notable for the following components:      Result Value   Sodium 146 (*)    Glucose, Bld 136 (*)    Total Protein 8.8 (*)    Albumin 5.1 (*)    All other components within normal limits  CBC - Abnormal; Notable for the following components:   WBC 14.0 (*)    All other components within normal limits  LIPASE, BLOOD  URINALYSIS, COMPLETE (UACMP) WITH  MICROSCOPIC  DIFFERENTIAL   ____________________________________________  EKG   ____________________________________________  RADIOLOGY  ED MD interpretation:   Official radiology report(s): DG Abd 2 Views  Result Date: 05/06/2020 CLINICAL DATA:  Abdominal pain EXAM: ABDOMEN - 2 VIEW COMPARISON:  August 08, 2019 FINDINGS: Supine and upright images obtained. There is extensive small bowel dilatation throughout most of the abdomen without appreciable air-fluid levels. No free air evident. There is elevation of the left hemidiaphragm. Lung bases are clear. No abnormal calcifications. IMPRESSION: Diffuse small bowel dilatation. Suspect a degree of bowel obstruction. Fairly severe ileus could present similarly. No free air. Visualized lung bases clear. Electronically Signed   By: August 10, 2019 III M.D.  On: 05/06/2020 12:52    ____________________________________________   PROCEDURES  Procedure(s) performed (including Critical Care): Critical care time 20 minutes. This includes reviewing the patient's old records examining the patient talking to the caregiver and speaking with Dr. Lady Gary, surgery, on the phone twice and then with the hospitalist  Procedures   ____________________________________________   INITIAL IMPRESSION / ASSESSMENT AND PLAN / ED COURSE Patient's x-rays are now visible sure what appears to be a small bowel obstruction versus severe ileus.  Patient pulse ox is 93-95 now his heart rate is in the 80s.  He is stable except for his apparent small bowel obstruction or I should say recurrent small bowel obstruction.  CT is pending.  Discussed with Dr. Lady Gary either she or Dr. Maia Plan will come and see this gentleman               ____________________________________________   FINAL CLINICAL IMPRESSION(S) / ED DIAGNOSES  Final diagnoses:  Abdominal pain  Small bowel obstruction [facility])     ED Discharge Orders    None       Note:  This  document was prepared using Dragon voice recognition software and may include unintentional dictation errors.    Arnaldo Natal, MD 05/06/20 1340    Arnaldo Natal, MD 05/06/20 [phone]

## 2020-05-07 ENCOUNTER — Encounter: Payer: Self-pay | Admitting: Internal Medicine

## 2020-05-07 DIAGNOSIS — E876 Hypokalemia: Secondary | ICD-10-CM

## 2020-05-07 DIAGNOSIS — I959 Hypotension, unspecified: Secondary | ICD-10-CM

## 2020-05-07 DIAGNOSIS — R338 Other retention of urine: Secondary | ICD-10-CM

## 2020-05-07 DIAGNOSIS — B370 Candidal stomatitis: Secondary | ICD-10-CM

## 2020-05-07 LAB — CBC
HCT: 35.7 % — ABNORMAL LOW (ref 39.0–52.0)
Hemoglobin: 12 g/dL — ABNORMAL LOW (ref 13.0–17.0)
MCH: 29.5 pg (ref 26.0–34.0)
MCHC: 33.6 g/dL (ref 30.0–36.0)
MCV: 87.7 fL (ref 80.0–100.0)
Platelets: 211 10*3/uL (ref 150–400)
RBC: 4.07 MIL/uL — ABNORMAL LOW (ref 4.22–5.81)
RDW: 12.9 % (ref 11.5–15.5)
WBC: 10 10*3/uL (ref 4.0–10.5)
nRBC: 0 % (ref 0.0–0.2)

## 2020-05-07 LAB — BASIC METABOLIC PANEL
Anion gap: 11 (ref 5–15)
BUN: 20 mg/dL (ref 8–23)
CO2: 27 mmol/L (ref 22–32)
Calcium: 8.5 mg/dL — ABNORMAL LOW (ref 8.9–10.3)
Chloride: 106 mmol/L (ref 98–111)
Creatinine, Ser: 0.69 mg/dL (ref 0.61–1.24)
GFR calc Af Amer: >60 mL/min (ref >60)
GFR calc non Af Amer: >60 mL/min (ref >60)
Glucose, Bld: 120 mg/dL — ABNORMAL HIGH (ref 70–99)
Potassium: 3.1 mmol/L — ABNORMAL LOW (ref 3.5–5.1)
Sodium: 144 mmol/L (ref 135–145)

## 2020-05-07 MED ORDER — KCL IN DEXTROSE-NACL 20-5-0.9 MEQ/L-%-% IV SOLN
INTRAVENOUS | Status: DC
  Filled 2020-05-07 (×6): qty 1000

## 2020-05-07 MED ORDER — TAMSULOSIN HCL 0.4 MG PO CAPS
0.4000 mg | ORAL_CAPSULE | Freq: Every day | ORAL | Status: DC
  Administered 2020-05-07 – 2020-05-10 (×4): 0.4 mg via ORAL
  Filled 2020-05-07 (×4): qty 1

## 2020-05-07 MED ORDER — FLUCONAZOLE 100MG IVPB
100.0000 mg | INTRAVENOUS | Status: DC
  Administered 2020-05-07 – 2020-05-09 (×3): 100 mg via INTRAVENOUS
  Filled 2020-05-07 (×4): qty 50

## 2020-05-07 MED ORDER — CARBAMAZEPINE ER 200 MG PO TB12
200.0000 mg | ORAL_TABLET | Freq: Two times a day (BID) | ORAL | Status: DC
  Administered 2020-05-07 – 2020-05-10 (×7): 200 mg via ORAL
  Filled 2020-05-07 (×8): qty 1

## 2020-05-07 MED ORDER — BETHANECHOL CHLORIDE 25 MG PO TABS
25.0000 mg | ORAL_TABLET | ORAL | Status: AC
  Administered 2020-05-07: 25 mg via ORAL
  Filled 2020-05-07: qty 1

## 2020-05-07 MED ORDER — MEDROXYPROGESTERONE ACETATE 10 MG PO TABS
10.0000 mg | ORAL_TABLET | Freq: Every day | ORAL | Status: DC
  Administered 2020-05-07 – 2020-05-10 (×4): 10 mg via ORAL
  Filled 2020-05-07 (×4): qty 1

## 2020-05-07 MED ORDER — SODIUM CHLORIDE 0.9 % IV SOLN
1.0000 g | INTRAVENOUS | Status: DC
  Administered 2020-05-08 – 2020-05-10 (×3): 1 g via INTRAVENOUS
  Filled 2020-05-07: qty 1
  Filled 2020-05-07: qty 10
  Filled 2020-05-07 (×2): qty 1

## 2020-05-07 MED ORDER — TRAZODONE HCL 100 MG PO TABS
300.0000 mg | ORAL_TABLET | Freq: Every day | ORAL | Status: DC
  Administered 2020-05-07 – 2020-05-09 (×3): 300 mg via ORAL
  Filled 2020-05-07 (×3): qty 3

## 2020-05-07 MED ORDER — LAMOTRIGINE 25 MG PO TABS
150.0000 mg | ORAL_TABLET | Freq: Two times a day (BID) | ORAL | Status: DC
  Administered 2020-05-07 – 2020-05-10 (×7): 150 mg via ORAL
  Filled 2020-05-07 (×7): qty 1

## 2020-05-07 NOTE — Progress Notes (Addendum)
Patient ID: Richard Gillespie, male   DOB: 1951/06/04, 69 y.o.   MRN: [id number] [facility] PROGRESS NOTE  CLARON ROSENCRANS LDJ:[id number] DOB: 01-09-1951 DOA: 05/06/2020 PCP: Gracelyn Nurse, MD  HPI/Subjective: Patient not able to give much history.  Answers no to every question I ask him.  Got a little agitated when I was asking him to many questions.  Objective: Vitals:   05/07/20 0406 05/07/20 1149  BP: (!) 97/48 106/79  Pulse: 64 67  Resp: 19   Temp: 98.4 F (36.9 C) 97.7 F (36.5 C)  SpO2: 95% 97%    Intake/Output Summary (Last 24 hours) at 05/07/2020 1151 Last data filed at 05/07/2020 1051 Gross per 24 hour  Intake 1139.05 ml  Output 600 ml  Net 539.05 ml   Filed Weights   05/06/20 1114  Weight: 65.8 kg    ROS: Review of Systems  Unable to perform ROS: Acuity of condition  Respiratory: Negative for shortness of breath.   Cardiovascular: Negative for chest pain.  Gastrointestinal: Negative for abdominal pain.   Exam: Physical Exam  HENT:  Head: Normocephalic and atraumatic.  Nose: No mucosal edema.  Mouth/Throat: No oropharyngeal exudate.  Eyes: Pupils are equal, round, and reactive to light. Conjunctivae and lids are normal.  Neck: Carotid bruit is not present.  Cardiovascular: Normal rate, regular rhythm, S1 normal, S2 normal and normal heart sounds.  Respiratory: He has no decreased breath sounds. He has no wheezes. He has no rhonchi. He has no rales.  GI: Soft. Bowel sounds are decreased. There is abdominal tenderness in the suprapubic area.  Musculoskeletal:     Right ankle: No swelling.     Left ankle: No swelling.  Neurological: He is alert.  Was able to move his arms on his own  Psychiatric:  Got agitated when I was asking him to many questions.      Data Reviewed: Basic Metabolic Panel: Recent Labs  Lab 05/06/20 1117 05/07/20 0501  NA 146* 144  K 3.9 3.1*  CL 104 106  CO2 28 27  GLUCOSE 136* 120*  BUN 20 20  CREATININE 0.94 0.69   CALCIUM 10.0 8.5*   Liver Function Tests: Recent Labs  Lab 05/06/20 1117  AST 33  ALT 25  ALKPHOS 60  BILITOT 0.9  PROT 8.8*  ALBUMIN 5.1*   Recent Labs  Lab 05/06/20 1117  LIPASE 19   CBC: Recent Labs  Lab 05/06/20 1117 05/07/20 0501  WBC 14.1*  14.0* 10.0  NEUTROABS 12.6*  --   HGB 15.5  15.6 12.0*  HCT 47.7  47.4 35.7*  MCV 90.7  90.3 87.7  PLT 219  158 211     Recent Results (from the past 240 hour(s))  SARS Coronavirus 2 by RT PCR (hospital order, performed in [facility] hospital lab) Nasopharyngeal Nasopharyngeal Swab     Status: None   Collection Time: 05/06/20  8:06 PM   Specimen: Nasopharyngeal Swab  Result Value Ref Range Status   SARS Coronavirus 2 NEGATIVE NEGATIVE Final    Comment: (NOTE) SARS-CoV-2 target nucleic acids are NOT DETECTED.  The SARS-CoV-2 RNA is generally detectable in upper and lower respiratory specimens during the acute phase of infection. The lowest concentration of SARS-CoV-2 viral copies this assay can detect is 250 copies / mL. A negative result does not preclude SARS-CoV-2 infection and should not be used as the sole basis for treatment or other patient management decisions.  A negative result may occur with improper specimen  collection / handling, submission of specimen other than nasopharyngeal swab, presence of viral mutation(s) within the areas targeted by this assay, and inadequate number of viral copies (<250 copies / mL). A negative result must be combined with clinical observations, patient history, and epidemiological information.  Fact Sheet for Patients:   BoilerBrush.com.cy  Fact Sheet for Healthcare Providers: https://pope.com/  This test is not yet approved or  cleared by the [place] FDA and has been authorized for detection and/or diagnosis of SARS-CoV-2 by FDA under an Emergency Use Authorization (EUA).  This EUA will remain in effect (meaning  this test can be used) for the duration of the COVID-19 declaration under Section 564(b)(1) of the Act, 21 U.S.C. section 360bbb-3(b)(1), unless the authorization is terminated or revoked sooner.  Performed at [facility], [address], [place], [place] [address]      Studies: CT ABDOMEN PELVIS W CONTRAST  Result Date: 05/06/2020 CLINICAL DATA:  Vomiting since yesterday. Abdomen radiographs earlier today showing small-bowel obstruction. History of small-bowel obstruction. Mental retardation. EXAM: CT ABDOMEN AND PELVIS WITH CONTRAST TECHNIQUE: Multidetector CT imaging of the abdomen and pelvis was performed using the standard protocol following bolus administration of intravenous contrast. CONTRAST:  OMNIPAQUE IOHEXOL 300 MG/ML  SOLN COMPARISON:  08/03/2019. Abdomen radiographs obtained earlier today. FINDINGS: Lower chest: Stable mild chronic atelectasis or scarring in the dependent portion of the right lung. The patient on his right side as on the previous examination. Coronary artery calcifications. Hepatobiliary: No focal liver abnormality is seen. No gallstones, gallbladder wall thickening, or biliary dilatation. Pancreas: Unremarkable. No pancreatic ductal dilatation or surrounding inflammatory changes. Spleen: Normal in size without focal abnormality. Adrenals/Urinary Tract: Normal appearing adrenal glands. Small bilateral renal cysts. Unremarkable ureters and urinary bladder except for extension of a portion of the urinary bladder into a right inguinal hernia. Stomach/Bowel: Again demonstrated is marked dilatation of multiple small bowel loops. There is mild swirling of the mesentery with improvement. Within the area of swirling mesentery, there are 2 loops of small bowel entering that area which are dilated prior to entering this area normal in caliber distally. No colonic dilatation is seen. Multiple central mesenteric surgical clips are demonstrated. The rectum is dilated.  Vascular/Lymphatic: No significant vascular findings are present in the abdomen or pelvis. No enlarged abdominal or pelvic lymph nodes. Reproductive: Mildly enlarged and mildly heterogeneous prostate gland. Other: Midline surgical scars. Small to moderate-sized bilateral inguinal hernias. Portion of the urinary bladder is extending into the proximal portion of the inguinal hernia on the right and there is fat in the inguinal hernia on left. Very tiny amount of free peritoneal fluid anterior to the liver on the right. No free peritoneal air seen. No pneumatosis intestinalis demonstrated. Musculoskeletal: Mild thoracolumbar spine scoliosis and degenerative changes. IMPRESSION: 1. High-grade small bowel obstruction. This could be due to each adhesions or an internal hernia based on the findings above. 2. Very tiny amount of free peritoneal fluid anterior to the liver on the right. 3. Small to moderate-sized bilateral inguinal hernias. A portion of the urinary bladder is extending into the proximal portion of the inguinal hernia on the right without obstruction. 4. Calcified coronary artery atherosclerosis. Electronically Signed   By: Beckie Salts M.D.   On: 05/06/2020 15:02   DG Abd 2 Views  Result Date: 05/06/2020 CLINICAL DATA:  Abdominal pain EXAM: ABDOMEN - 2 VIEW COMPARISON:  August 08, 2019 FINDINGS: Supine and upright images obtained. There is extensive small bowel dilatation throughout  most of the abdomen without appreciable air-fluid levels. No free air evident. There is elevation of the left hemidiaphragm. Lung bases are clear. No abnormal calcifications. IMPRESSION: Diffuse small bowel dilatation. Suspect a degree of bowel obstruction. Fairly severe ileus could present similarly. No free air. Visualized lung bases clear. Electronically Signed   By: Lowella Grip III M.D.   On: 05/06/2020 12:52    Scheduled Meds: . carbamazepine  200 mg Oral BID  . lamoTRIgine  150 mg Oral BID  .  medroxyPROGESTERone  10 mg Oral Daily  . pantoprazole (PROTONIX) IV  40 mg Intravenous Q24H  . sodium chloride flush  3 mL Intravenous Once  . tamsulosin  0.4 mg Oral Daily  . traZODone  300 mg Oral QHS   Continuous Infusions: . cefTRIAXone (ROCEPHIN)  IV Stopped (05/06/20 1851)  . dextrose 5 % and 0.9 % NaCl with KCl 20 mEq/L    . fluconazole (DIFLUCAN) IV 100 mg (05/07/20 1118)  . levETIRAcetam 500 mg (05/07/20 0439)    Assessment/Plan:  1. Small Bowel obstruction versus ileus.  Awaiting for bowel movement.  Since patient does not vomiting can start oral meds.  Surgical follow-up.  IV fluid hydration add potassium to fluids. 2. Hypotension.  Asked the nurse to search his body to take off his clonidine patch.  Clonidine patch removed off of his back.  Continue IV fluids 3. Thrush on the tongue.  Start Diflucan. 4. There is documentation of UTI but I do not see a urine that was sent.  Looking back through care everywhere looks like urinalysis was sent at PCP office and was positive.  Continue Rocephin. 5. Urinary retention trial of Urecholine.  Start Flomax.  Send off urine once able.  In and out cath if needed. 6. Hypokalemia put potassium and IV fluids 7. History of seizures.  Continue usual medications   Code Status:     Code Status Orders  (From admission, onward)         Start     Ordered   05/06/20 1632  Do not attempt resuscitation (DNR)  Continuous       Question Answer Comment  In the event of cardiac or respiratory ARREST Do not call a "code blue"   In the event of cardiac or respiratory ARREST Do not perform Intubation, CPR, defibrillation or ACLS   In the event of cardiac or respiratory ARREST Use medication by any route, position, wound care, and other measures to relive pain and suffering. May use oxygen, suction and manual treatment of airway obstruction as needed for comfort.   Comments Code status was discussed with his legal guardian Ezekial Arns who wants him  placed on a DNR status      05/06/20 1632        Code Status History    Date Active Date Inactive Code Status Order ID Comments User Context   05/06/2020 1522 05/06/2020 1632 Full Code [id number]  Collier Bullock, MD ED   08/03/2019 1115 08/17/2019 1911 DNR [id number]  Lang Snow, NP ED   09/19/2018 1221 09/24/2018 1625 DNR [id number]  Bettey Costa, MD Inpatient   09/12/2018 2321 09/19/2018 1221 Full Code [id number]  Lance Coon, MD Inpatient   01/12/2017 1647 01/15/2017 1928 Full Code [id number]  Epifanio Lesches, MD ED   Advance Care Planning Activity     Family Communication: Spoke with brother on the phone Disposition Plan: Status is: Inpatient  Dispo: The patient is from: Group home  Anticipated d/c is to: Group home              Anticipated d/c date is: Likely will need at least 4 days here in the hospital              Patient currently being treated for bowel obstruction   Consultants:  General surgery  Antibiotics:  Diflucan  Time spent: 28 minutes  [employer]

## 2020-05-07 NOTE — Progress Notes (Signed)
This is a patient being seen in consultation by Dr. Maia Plan. I am following over the weekend. I saw the patient on rounds this morning, but he was fairly noncooperative. He is unable to answer any questions. 2 bowel movements were recorded by the nursing staff.  Today's Vitals   05/07/20 0010 05/07/20 0406 05/07/20 0742 05/07/20 1149  BP: (!) 105/49 (!) 97/48  106/79  Pulse: (!) 57 64  67  Resp: 15 19    Temp: 98.1 F (36.7 C) 98.4 F (36.9 C)  97.7 F (36.5 C)  TempSrc: Oral Oral    SpO2: 96% 95%  97%  Weight:      Height:      PainSc:   0-No pain    Body mass index is 22.05 kg/m.  I/O last 3 completed shifts: In: 1139.1 [I.V.:971.8; IV Piggyback:167.2] Out: -  Total I/O In: -  Out: 600 [Urine:600]   Yellow  Yellow      Clarity Clear  CloudyAbnormal      Specific Gravity 1.000 - 1.030  >=1.030      pH, Urine 5.0 - 8.0  5.5      Protein, Urinalysis Negative, Trace mg/dL 382 Abnormal      Glucose, Urinalysis Negative mg/dL Negative      Ketones, Urinalysis Negative mg/dL TraceAbnormal      Blood, Urinalysis Negative  TraceAbnormal      Nitrite, Urinalysis Negative  PositiveAbnormal      Leukocyte Esterase, Urinalysis Negative  SmallAbnormal      White Blood Cells, Urinalysis None Seen, 0-3 /hpf >50Abnormal      Red Blood Cells, Urinalysis None Seen, 0-3 /hpf 0-3      Bacteria, Urinalysis None Seen /hpf ManyAbnormal      Squamous Epithelial Cells, Urinalysis Rare, Few, None Seen /hpf Rare      Resulting Agency  [facility]     Focused abdominal exam demonstrates surgical scars.  It is soft but slightly distended.  No tenderness elicited on exam.  Impression and plan: This is a 69 year old man with mental retardation.  He has had prior surgery for a small bowel obstruction.  He presented to the emergency department after 2 days history of vomiting.  His PCP had obtained labs that showed a UTI.  Imaging has consistently shown  dilation of his bowels, suggesting a functional GI motility problem, rather than a bowel obstruction.  He is having bowel movements at this time.  He is unlikely to require surgery.  I will continue to follow him through the weekend, with Dr. Maia Plan resuming surveillance on Monday.

## 2020-05-08 MED ORDER — POTASSIUM CHLORIDE 20 MEQ PO PACK
20.0000 meq | PACK | Freq: Every day | ORAL | Status: DC
  Administered 2020-05-08 – 2020-05-09 (×2): 20 meq via ORAL
  Filled 2020-05-08 (×2): qty 1

## 2020-05-08 NOTE — Progress Notes (Signed)
Patient ID: Richard Gillespie, male   DOB: 03/25/1951, 69 y.o.   MRN: [id number] [facility] PROGRESS NOTE  Richard Gillespie WER:[id number] DOB: [birth year] DOA: 05/06/2020 PCP: Baxter Hire, MD  HPI/Subjective: Patient answers no to every question.  Did not drink much of the liquids this morning.  I asked him if he was thirsty and he said no.  He did shake my hand as I was leaving.  Objective: Vitals:   05/08/20 0635 05/08/20 1415  BP: 140/83 117/68  Pulse: 70 61  Resp: 18 18  Temp: 97.6 F (36.4 C) 97.8 F (36.6 C)  SpO2: 96% 97%    Intake/Output Summary (Last 24 hours) at 05/08/2020 1425 Last data filed at 05/08/2020 1008 Gross per 24 hour  Intake 2730.94 ml  Output --  Net 2730.94 ml   Filed Weights   05/06/20 1114  Weight: 65.8 kg    ROS: Review of Systems  Unable to perform ROS: Acuity of condition  Respiratory: Negative for shortness of breath.   Cardiovascular: Negative for chest pain.  Gastrointestinal: Negative for abdominal pain.   Exam: Physical Exam  HENT:  Head: Normocephalic and atraumatic.  Nose: No mucosal edema.  Mouth/Throat: No oropharyngeal exudate.  Eyes: Pupils are equal, round, and reactive to light. Conjunctivae and lids are normal.  Neck: Carotid bruit is not present.  Cardiovascular: Normal rate, regular rhythm, S1 normal, S2 normal and normal heart sounds.  Respiratory: He has no decreased breath sounds. He has no wheezes. He has no rhonchi. He has no rales.  GI: Soft. There is no abdominal tenderness.  Musculoskeletal:     Right ankle: No swelling.     Left ankle: No swelling.  Neurological: He is alert.  Moves all extremities on his own.      Data Reviewed: Basic Metabolic Panel: Recent Labs  Lab 05/06/20 1117 05/07/20 0501  NA 146* 144  K 3.9 3.1*  CL 104 106  CO2 28 27  GLUCOSE 136* 120*  BUN 20 20  CREATININE 0.94 0.69  CALCIUM 10.0 8.5*   Liver Function Tests: Recent Labs  Lab 05/06/20 1117  AST 33  ALT 25   ALKPHOS 60  BILITOT 0.9  PROT 8.8*  ALBUMIN 5.1*   Recent Labs  Lab 05/06/20 1117  LIPASE 19   CBC: Recent Labs  Lab 05/06/20 1117 05/07/20 0501  WBC 14.1*  14.0* 10.0  NEUTROABS 12.6*  --   HGB 15.5  15.6 12.0*  HCT 47.7  47.4 35.7*  MCV 90.7  90.3 87.7  PLT 219  158 211     Recent Results (from the past 240 hour(s))  SARS Coronavirus 2 by RT PCR (hospital order, performed in [facility] hospital lab) Nasopharyngeal Nasopharyngeal Swab     Status: None   Collection Time: 05/06/20  8:06 PM   Specimen: Nasopharyngeal Swab  Result Value Ref Range Status   SARS Coronavirus 2 NEGATIVE NEGATIVE Final    Comment: (NOTE) SARS-CoV-2 target nucleic acids are NOT DETECTED.  The SARS-CoV-2 RNA is generally detectable in upper and lower respiratory specimens during the acute phase of infection. The lowest concentration of SARS-CoV-2 viral copies this assay can detect is 250 copies / mL. A negative result does not preclude SARS-CoV-2 infection and should not be used as the sole basis for treatment or other patient management decisions.  A negative result may occur with improper specimen collection / handling, submission of specimen other than nasopharyngeal swab, presence of viral mutation(s) within the  areas targeted by this assay, and inadequate number of viral copies (<250 copies / mL). A negative result must be combined with clinical observations, patient history, and epidemiological information.  Fact Sheet for Patients:   BoilerBrush.com.cy  Fact Sheet for Healthcare Providers: https://pope.com/  This test is not yet approved or  cleared by the [place] FDA and has been authorized for detection and/or diagnosis of SARS-CoV-2 by FDA under an Emergency Use Authorization (EUA).  This EUA will remain in effect (meaning this test can be used) for the duration of the COVID-19 declaration under Section 564(b)(1) of  the Act, 21 U.S.C. section 360bbb-3(b)(1), unless the authorization is terminated or revoked sooner.  Performed at [facility], [address] [place], [place] [address]      Studies: CT ABDOMEN PELVIS W CONTRAST  Result Date: 05/06/2020 CLINICAL DATA:  Vomiting since yesterday. Abdomen radiographs earlier today showing small-bowel obstruction. History of small-bowel obstruction. Mental retardation. EXAM: CT ABDOMEN AND PELVIS WITH CONTRAST TECHNIQUE: Multidetector CT imaging of the abdomen and pelvis was performed using the standard protocol following bolus administration of intravenous contrast. CONTRAST:  OMNIPAQUE IOHEXOL 300 MG/ML  SOLN COMPARISON:  08/03/2019. Abdomen radiographs obtained earlier today. FINDINGS: Lower chest: Stable mild chronic atelectasis or scarring in the dependent portion of the right lung. The patient on his right side as on the previous examination. Coronary artery calcifications. Hepatobiliary: No focal liver abnormality is seen. No gallstones, gallbladder wall thickening, or biliary dilatation. Pancreas: Unremarkable. No pancreatic ductal dilatation or surrounding inflammatory changes. Spleen: Normal in size without focal abnormality. Adrenals/Urinary Tract: Normal appearing adrenal glands. Small bilateral renal cysts. Unremarkable ureters and urinary bladder except for extension of a portion of the urinary bladder into a right inguinal hernia. Stomach/Bowel: Again demonstrated is marked dilatation of multiple small bowel loops. There is mild swirling of the mesentery with improvement. Within the area of swirling mesentery, there are 2 loops of small bowel entering that area which are dilated prior to entering this area normal in caliber distally. No colonic dilatation is seen. Multiple central mesenteric surgical clips are demonstrated. The rectum is dilated. Vascular/Lymphatic: No significant vascular findings are present in the abdomen or pelvis. No  enlarged abdominal or pelvic lymph nodes. Reproductive: Mildly enlarged and mildly heterogeneous prostate gland. Other: Midline surgical scars. Small to moderate-sized bilateral inguinal hernias. Portion of the urinary bladder is extending into the proximal portion of the inguinal hernia on the right and there is fat in the inguinal hernia on left. Very tiny amount of free peritoneal fluid anterior to the liver on the right. No free peritoneal air seen. No pneumatosis intestinalis demonstrated. Musculoskeletal: Mild thoracolumbar spine scoliosis and degenerative changes. IMPRESSION: 1. High-grade small bowel obstruction. This could be due to each adhesions or an internal hernia based on the findings above. 2. Very tiny amount of free peritoneal fluid anterior to the liver on the right. 3. Small to moderate-sized bilateral inguinal hernias. A portion of the urinary bladder is extending into the proximal portion of the inguinal hernia on the right without obstruction. 4. Calcified coronary artery atherosclerosis. Electronically Signed   By: Beckie Salts M.D.   On: 05/06/2020 15:02    Scheduled Meds: . carbamazepine  200 mg Oral BID  . lamoTRIgine  150 mg Oral BID  . medroxyPROGESTERone  10 mg Oral Daily  . pantoprazole (PROTONIX) IV  40 mg Intravenous Q24H  . potassium chloride  20 mEq Oral Daily  . sodium chloride flush  3  mL Intravenous Once  . tamsulosin  0.4 mg Oral Daily  . traZODone  300 mg Oral QHS   Continuous Infusions: . cefTRIAXone (ROCEPHIN)  IV Stopped (05/08/20 0539)  . dextrose 5 % and 0.9 % NaCl with KCl 20 mEq/L 75 mL/hr at 05/08/20 1026  . fluconazole (DIFLUCAN) IV 100 mg (05/08/20 1123)  . levETIRAcetam Stopped (05/08/20 0417)    Assessment/Plan:  1. Small Bowel obstruction versus ileus.  Advance diet to full liquid diet today and hopefully minced food tomorrow.  General surgery following.  IV fluid hydration 2. Hypokalemia potassium and IV fluids will also give  K-Lor. 3. Hypotension.  Blood pressure better after removing clonidine patch. 4. Thrush on the tongue.  Start Diflucan. 5. There is documentation of UTI.  Looking back through care everywhere looks like urinalysis was sent at PCP office and was positive.  Our urinalysis here was negative.  Continue Rocephin for now.  Hopefully just a short course of Rocephin. 6. Urinary retention.  Continue Flomax.  Patient urinated this morning. 7. History of seizures.  Continue usual medications 8. Weakness.  Physical therapy evaluation   Code Status:     Code Status Orders  (From admission, onward)         Start     Ordered   05/06/20 1632  Do not attempt resuscitation (DNR)  Continuous       Question Answer Comment  In the event of cardiac or respiratory ARREST Do not call a "code blue"   In the event of cardiac or respiratory ARREST Do not perform Intubation, CPR, defibrillation or ACLS   In the event of cardiac or respiratory ARREST Use medication by any route, position, wound care, and other measures to relive pain and suffering. May use oxygen, suction and manual treatment of airway obstruction as needed for comfort.   Comments Code status was discussed with his legal guardian Raynor Calcaterra who wants him placed on a DNR status      05/06/20 1632        Code Status History    Date Active Date Inactive Code Status Order ID Comments User Context   05/06/2020 1522 05/06/2020 1632 Full Code [id number]  Lucile Shutters, MD ED   08/03/2019 1115 08/17/2019 1911 DNR [id number]  Jimmye Norman, NP ED   09/19/2018 1221 09/24/2018 1625 DNR [id number]  Adrian Saran, MD Inpatient   09/12/2018 2321 09/19/2018 1221 Full Code [id number]  Oralia Manis, MD Inpatient   01/12/2017 1647 01/15/2017 1928 Full Code [id number]  Katha Hamming, MD ED   Advance Care Planning Activity     Family Communication: Spoke with brother on the phone Disposition Plan: Status is: Inpatient  Dispo: The [facility] is from:  [facility]              Anticipated d/c is to: Group home              Anticipated d/c date is: Potentially 05/10/2020 if continues to improve.              Patient currently being treated for bowel obstruction.  Trying to advance to full liquid diet today and hopefully solid food for tomorrow.  Consultants:  General surgery  Antibiotics:  Diflucan  Time spent: 28 minutes  [employer]

## 2020-05-08 NOTE — Progress Notes (Signed)
This is a patient being seen in consultation by Dr. Maia Plan. I am following over the weekend. I saw the patient on rounds this morning, but he was fairly noncooperative. He is unable to answer any questions. 6 bowel movements were recorded by the nursing staff.  Today's Vitals   05/08/20 0000 05/08/20 0631 05/08/20 0635 05/08/20 1415  BP:   140/83 117/68  Pulse:   70 61  Resp:   18 18  Temp:   97.6 F (36.4 C) 97.8 F (36.6 C)  TempSrc:   Oral Oral  SpO2:   96% 97%  Weight:      Height:      PainSc: Asleep Asleep     Body mass index is 22.05 kg/m.  I/O last 3 completed shifts: In: 3510 [I.V.:2892.6; IV Piggyback:617.4] Out: 600 [Urine:600] Total I/O In: 360 [P.O.:360] Out: -    Yellow  Yellow      Clarity Clear  CloudyAbnormal      Specific Gravity 1.000 - 1.030  >=1.030      pH, Urine 5.0 - 8.0  5.5      Protein, Urinalysis Negative, Trace mg/dL 948 Abnormal      Glucose, Urinalysis Negative mg/dL Negative      Ketones, Urinalysis Negative mg/dL TraceAbnormal      Blood, Urinalysis Negative  TraceAbnormal      Nitrite, Urinalysis Negative  PositiveAbnormal      Leukocyte Esterase, Urinalysis Negative  SmallAbnormal      White Blood Cells, Urinalysis None Seen, 0-3 /hpf >50Abnormal      Red Blood Cells, Urinalysis None Seen, 0-3 /hpf 0-3      Bacteria, Urinalysis None Seen /hpf ManyAbnormal      Squamous Epithelial Cells, Urinalysis Rare, Few, None Seen /hpf Rare      Resulting Agency  [facility]     Focused abdominal exam demonstrates surgical scars.  It is soft but slightly distended.  No tenderness elicited on exam.  Impression and plan: This is a 69 year old man with mental retardation.  He has had prior surgery for a small bowel obstruction.  He presented to the emergency department after 2 days history of vomiting.  His PCP had obtained labs that showed a UTI.  Imaging has consistently shown dilation of his bowels,  suggesting a functional GI motility problem, rather than a bowel obstruction.  He is having bowel movements at this time.  He is unlikely to require surgery.  I will continue to follow him through the weekend, with Dr. Maia Plan resuming surveillance on Monday.

## 2020-05-08 NOTE — Progress Notes (Signed)
PT Cancellation Note  Patient Details Name: Richard Gillespie MRN: [id number] DOB: August 01, 1951   Cancelled Treatment:    Reason Eval/Treat Not Completed: Medical issues which prohibited therapy. Pt's medical chart reviewed and determined to have K+ level of 3.1, outside range of acceptable values for activity. Will re-attempt at later date as appropriate.   Dian Situ 05/08/2020, 1:18 PM

## 2020-05-09 LAB — BASIC METABOLIC PANEL
Anion gap: 7 (ref 5–15)
BUN: 10 mg/dL (ref 8–23)
CO2: 27 mmol/L (ref 22–32)
Calcium: 8.1 mg/dL — ABNORMAL LOW (ref 8.9–10.3)
Chloride: 112 mmol/L — ABNORMAL HIGH (ref 98–111)
Creatinine, Ser: 0.68 mg/dL (ref 0.61–1.24)
GFR calc Af Amer: >60 mL/min (ref >60)
GFR calc non Af Amer: >60 mL/min (ref >60)
Glucose, Bld: 82 mg/dL (ref 70–99)
Potassium: 3.1 mmol/L — ABNORMAL LOW (ref 3.5–5.1)
Sodium: 146 mmol/L — ABNORMAL HIGH (ref 135–145)

## 2020-05-09 LAB — MAGNESIUM: Magnesium: 1.7 mg/dL (ref 1.7–2.4)

## 2020-05-09 MED ORDER — SODIUM CHLORIDE 0.9 % IV SOLN: INTRAVENOUS | Status: DC | PRN

## 2020-05-09 MED ORDER — MAGNESIUM SULFATE 2 GM/50ML IV SOLN
2.0000 g | Freq: Once | INTRAVENOUS | Status: AC
  Administered 2020-05-09: 2 g via INTRAVENOUS
  Filled 2020-05-09: qty 50

## 2020-05-09 MED ORDER — POTASSIUM CHLORIDE 20 MEQ PO PACK
20.0000 meq | PACK | Freq: Two times a day (BID) | ORAL | Status: DC
  Administered 2020-05-09 – 2020-05-10 (×3): 20 meq via ORAL
  Filled 2020-05-09 (×3): qty 1

## 2020-05-09 NOTE — Progress Notes (Signed)
Patient ID: Richard Gillespie, male   DOB: Dec 21, 1950, 69 y.o.   MRN: [id number]     SURGICAL PROGRESS NOTE   Hospital Day(s): 3.   Post op day(s):  Marland Kitchen   Interval History: Patient seen and examined, no acute events or new complaints overnight.  Patient is minimally verbal responding to simple questions such as how already today which he answers fine.  When questioned about pain he is able to say no.  Otherwise not able to say febrile nausea or vomiting or bowel movement.  Open discussion with nurse and review of chart patient had bowel movement yesterday.  He did eat eat all day full liquids we will any problems.  No nausea or vomiting.  Vital signs in last 24 hours: [min-max] current  Temp:  [97.8 F (36.6 C)-98.3 F (36.8 C)] 98.1 F (36.7 C) (06/14 0502) Pulse Rate:  [61-65] 65 (06/14 0502) Resp:  [16-18] 16 (06/14 0502) BP: (114-143)/(55-68) 143/56 (06/14 0502) SpO2:  [95 %-98 %] 98 % (06/14 0502)     Height: 5\' 8"  (172.7 cm) Weight: 65.8 kg BMI (Calculated): 22.05   Physical Exam:  Constitutional: alert, cooperative and no distress  Respiratory: breathing non-labored at rest  Cardiovascular: regular rate and sinus rhythm  Gastrointestinal: soft, non-tender, and non-distended  Labs:  CBC Latest Ref Rng & Units 05/07/2020 05/06/2020 05/06/2020  WBC 4.0 - 10.5 K/uL 10.0 14.1(H) 14.0(H)  Hemoglobin 13.0 - 17.0 g/dL 12.0(L) 15.5 15.6  Hematocrit 39 - 52 % 35.7(L) 47.7 47.4  Platelets 150 - 400 K/uL 211 219 158   CMP Latest Ref Rng & Units 05/07/2020 05/06/2020 08/17/2019  Glucose 70 - 99 mg/dL 08/19/2019) 657(Q) 469(G)  BUN 8 - 23 mg/dL 20 20 17   Creatinine 0.61 - 1.24 mg/dL 295(M 8.41  Sodium 135 - 145 mmol/L 144 146(H) 139  Potassium 3.5 - 5.1 mmol/L 3.1(L) 3.9 4.0  Chloride 98 - 111 mmol/L 106 104 104  CO2 22 - 32 mmol/L 27 28 23   Calcium 8.9 - 10.3 mg/dL 3.24) 4.01 8.9  Total Protein 6.5 - 8.1 g/dL - 8.8(H) -  Total Bilirubin 0.3 - 1.2 mg/dL - 0.9 -  Alkaline Phos 38 - 126 U/L - 60  -  AST 15 - 41 U/L - 33 -  ALT 0 - 44 U/L - 25 -    Imaging studies: No new pertinent imaging studies   Assessment/Plan:  69 y.o. male with UTI, complicated by pertinent comorbidities including generalized small and large bowel ileus, versus obstruction.  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  Patient did not needed NGT.  Patient was treated for the UTI and electrolytes with hydration.  Patient has been passing gas and having bowel movement.  Abdominal x-ray from years ago with similar bowel dilation.  I will advance his diet to soft diet.  From surgical standpoint patient can be discharged when medically stable.  We will follow while inpatient.  0.2(V, MD

## 2020-05-09 NOTE — Progress Notes (Signed)
PT Cancellation Note  Patient Details Name: Richard Gillespie MRN: [id number] DOB: 28-Dec-1950   Cancelled Treatment:    Reason Eval/Treat Not Completed: Patient declined, no reason specified. Patient received in room yelling out. Motioning toward IV pole that is beeping. Asked if he could participate and patient stated "No, Good night" wants to go to sleep. Will re-attempt at another time.    [name] 05/09/2020, 12:52 PM

## 2020-05-09 NOTE — Progress Notes (Signed)
Patient ID: Richard Gillespie, male   DOB: 05-Nov-1951, 69 y.o.   MRN: [id number] [facility] PROGRESS NOTE  Richard Gillespie GQB:[id number] DOB: 1951-02-12 DOA: 05/06/2020 PCP: Gracelyn Nurse, MD  HPI/Subjective: Patient was sitting up in the bed feeding himself some lunch.  He was eating a cookie.  Does not complain of any pain.  Objective: Vitals:   05/09/20 0502 05/09/20 1218  BP: (!) 143/56 (!) 114/58  Pulse: 65 76  Resp: 16 18  Temp: 98.1 F (36.7 C) 97.7 F (36.5 C)  SpO2: 98% 96%    Intake/Output Summary (Last 24 hours) at 05/09/2020 1345 Last data filed at 05/09/2020 1124 Gross per 24 hour  Intake 1864.49 ml  Output 900 ml  Net 964.49 ml   Filed Weights   05/06/20 1114  Weight: 65.8 kg    ROS: Review of Systems  Unable to perform ROS: Acuity of condition  Respiratory: Negative for shortness of breath.   Cardiovascular: Negative for chest pain.  Gastrointestinal: Negative for abdominal pain.   Exam: Physical Exam  HENT:  Head: Normocephalic and atraumatic.  Nose: No mucosal edema.  Mouth/Throat: No oropharyngeal exudate.  Eyes: Pupils are equal, round, and reactive to light. Conjunctivae and lids are normal.  Neck: Carotid bruit is not present.  Cardiovascular: Normal rate, regular rhythm, S1 normal, S2 normal and normal heart sounds.  Respiratory: He has no decreased breath sounds. He has no wheezes. He has no rhonchi. He has no rales.  GI: Soft. There is no abdominal tenderness.  Musculoskeletal:     Right ankle: No swelling.     Left ankle: No swelling.  Neurological: He is alert.  Moves all extremities on his own.      Data Reviewed: Basic Metabolic Panel: Recent Labs  Lab 05/06/20 1117 05/07/20 0501 05/09/20 0712  NA 146* 144 146*  K 3.9 3.1* 3.1*  CL 104 106 112*  CO2 28 27 27   GLUCOSE 136* 120* 82  BUN 20 20 10   CREATININE 0.94 0.69 0.68  CALCIUM 10.0 8.5* 8.1*  MG  --   --  1.7   Liver Function Tests: Recent Labs  Lab  05/06/20 1117  AST 33  ALT 25  ALKPHOS 60  BILITOT 0.9  PROT 8.8*  ALBUMIN 5.1*   Recent Labs  Lab 05/06/20 1117  LIPASE 19   CBC: Recent Labs  Lab 05/06/20 1117 05/07/20 0501  WBC 14.1*  14.0* 10.0  NEUTROABS 12.6*  --   HGB 15.5  15.6 12.0*  HCT 47.7  47.4 35.7*  MCV 90.7  90.3 87.7  PLT 219  158 211     Recent Results (from the past 240 hour(s))  SARS Coronavirus 2 by RT PCR (hospital order, performed in [facility] hospital lab) Nasopharyngeal Nasopharyngeal Swab     Status: None   Collection Time: 05/06/20  8:06 PM   Specimen: Nasopharyngeal Swab  Result Value Ref Range Status   SARS Coronavirus 2 NEGATIVE NEGATIVE Final    Comment: (NOTE) SARS-CoV-2 target nucleic acids are NOT DETECTED.  The SARS-CoV-2 RNA is generally detectable in upper and lower respiratory specimens during the acute phase of infection. The lowest concentration of SARS-CoV-2 viral copies this assay can detect is 250 copies / mL. A negative result does not preclude SARS-CoV-2 infection and should not be used as the sole basis for treatment or other patient management decisions.  A negative result may occur with improper specimen collection / handling, submission of specimen other than  nasopharyngeal swab, presence of viral mutation(s) within the areas targeted by this assay, and inadequate number of viral copies (<250 copies / mL). A negative result must be combined with clinical observations, patient history, and epidemiological information.  Fact Sheet for Patients:   BoilerBrush.com.cy  Fact Sheet for Healthcare Providers: https://pope.com/  This test is not yet approved or  cleared by the [place] FDA and has been authorized for detection and/or diagnosis of SARS-CoV-2 by FDA under an Emergency Use Authorization (EUA).  This EUA will remain in effect (meaning this test can be used) for the duration of the COVID-19  declaration under Section 564(b)(1) of the Act, 21 U.S.C. section 360bbb-3(b)(1), unless the authorization is terminated or revoked sooner.  Performed at [facility], [address] [place], [place] [address]      Scheduled Meds: . carbamazepine  200 mg Oral BID  . lamoTRIgine  150 mg Oral BID  . medroxyPROGESTERone  10 mg Oral Daily  . pantoprazole (PROTONIX) IV  40 mg Intravenous Q24H  . potassium chloride  20 mEq Oral Daily  . potassium chloride  20 mEq Oral BID  . sodium chloride flush  3 mL Intravenous Once  . tamsulosin  0.4 mg Oral Daily  . traZODone  300 mg Oral QHS   Continuous Infusions: . cefTRIAXone (ROCEPHIN)  IV 200 mL/hr at 05/09/20 0700  . fluconazole (DIFLUCAN) IV 100 mg (05/09/20 1243)  . levETIRAcetam Stopped (05/09/20 0501)    Assessment/Plan:  1. Small Bowel obstruction versus ileus.  Advance diet to dysphagia diet with thin liquids today.  Hopefully will be able to get out of the hospital tomorrow. 2. Hypokalemia and hypomagnesemia.  Replace magnesium IV today and oral potassium. 3. Hypotension.  Blood pressure stable after removing clonidine patch. 4. Thrush on the tongue.  Continue Diflucan. 5. There is documentation of UTI.  Looking back through care everywhere looks like urinalysis was sent at PCP office and was positive.  Our urinalysis here was negative.  Continue Rocephin for now.  Will discontinue Rocephin upon discharge. 6. Urinary retention.  Continue Flomax. 7. History of seizures.  Continue usual medications 8. Weakness.  Physical therapy evaluation   Code Status:     Code Status Orders  (From admission, onward)         Start     Ordered   05/06/20 1632  Do not attempt resuscitation (DNR)  Continuous       Question Answer Comment  In the event of cardiac or respiratory ARREST Do not call a "code blue"   In the event of cardiac or respiratory ARREST Do not perform Intubation, CPR, defibrillation or ACLS   In the event of  cardiac or respiratory ARREST Use medication by any route, position, wound care, and other measures to relive pain and suffering. May use oxygen, suction and manual treatment of airway obstruction as needed for comfort.   Comments Code status was discussed with his legal guardian Richard Gillespie who wants him placed on a DNR status      05/06/20 1632        Code Status History    Date Active Date Inactive Code Status Order ID Comments User Context   05/06/2020 1522 05/06/2020 1632 Full Code [id number]  Lucile Shutters, MD ED   08/03/2019 1115 08/17/2019 1911 DNR [id number]  Jimmye Norman, NP ED   09/19/2018 1221 09/24/2018 1625 DNR [id number]  Adrian Saran, MD Inpatient   09/12/2018 2321 09/19/2018 1221 Full Code [id number]  Lance Coon, MD Inpatient   01/12/2017 1647 01/15/2017 1928 Full Code [id number]  Epifanio Lesches, MD ED   Advance Care Planning Activity     Family Communication: Spoke with brother on the phone Disposition Plan: Status is: Inpatient  Dispo: The [facility] is from: [facility]              Anticipated d/c is to: Group home              Anticipated d/c date is: 05/10/2020              Patient currently being treated for bowel obstruction.  Advance diet to solid food today.  Replace magnesium and potassium.  Recheck electrolytes tomorrow.  Consultants:  General surgery  Antibiotics:  Diflucan  Rocephin  Time spent: 27 minutes  [place]

## 2020-05-09 NOTE — TOC Initial Note (Signed)
Transition of Care [facility]) - Initial/Assessment Note    Patient Details  Name: Richard Gillespie MRN: [id number] Date of Birth: 1951-03-14  Transition of Care [facility]) CM/SW Contact:    Chapman Fitch, RN Phone Number: 05/09/2020, 1:23 PM  Clinical Narrative:                 Patient lives at [facility] home.  Per chart review patient has been there for 42 years Patient legal guardian is his brother Richard Gillespie.  Richard Gillespie confirms the plan is for patient to return to group home when medically cleared  PT eval pending   RNCM spoke with group home owner Richard Gillespie.  He confirms patient can return at discharge, and they will provide transportation.  Re requests that Fl2 and DC summary be faxed to [phone]  Prior to discharge Mr Richard Gillespie request that I update the facility nurse Richard Gillespie [phone], she is currently un available.   Expected Discharge Plan: Group Home Barriers to Discharge: Continued Medical Work up   Patient Goals and CMS Choice        Expected Discharge Plan and Services Expected Discharge Plan: Group Home       Living arrangements for the past 2 months: Group Home                                      Prior Living Arrangements/Services Living arrangements for the past 2 months: Group Home Lives with:: Facility Resident Patient language and need for interpreter reviewed:: Yes        Need for Family Participation in Patient Care: Yes (Comment) Care giver support system in place?: Yes (comment)   Criminal Activity/Legal Involvement Pertinent to Current Situation/Hospitalization: No - Comment as needed  Activities of Daily Living Home Assistive Devices/Equipment: None ADL Screening (condition at time of admission) Patient's cognitive ability adequate to safely complete daily activities?: No Is the patient deaf or have difficulty hearing?: No Does the patient have difficulty seeing, even when wearing glasses/contacts?: No Does the patient have  difficulty concentrating, remembering, or making decisions?: No Patient able to express need for assistance with ADLs?: No Does the patient have difficulty dressing or bathing?: Yes Independently performs ADLs?: No Communication: Independent Dressing (OT): Needs assistance Is this a change from baseline?: Pre-admission baseline Grooming: Needs assistance Is this a change from baseline?: Pre-admission baseline Feeding: Independent Bathing: Needs assistance Is this a change from baseline?: Pre-admission baseline Toileting: Needs assistance Is this a change from baseline?: Pre-admission baseline In/Out Bed: Needs assistance Is this a change from baseline?: Pre-admission baseline Walks in Home: Independent Does the patient have difficulty walking or climbing stairs?: No Weakness of Legs: None Weakness of Arms/Hands: None  Permission Sought/Granted                  Emotional Assessment              Admission diagnosis:  Small bowel obstruction (HCC) [K56.609] SBO (small bowel obstruction) (HCC) [K56.609] Abdominal pain [R10.9] Patient Active Problem List   Diagnosis Date Noted  . Hypotension   . Thrush   . Acute urinary retention   . Small bowel obstruction (HCC) 08/03/2019  . Malnutrition of moderate degree 09/17/2018  . SBO (small bowel obstruction) (HCC) 09/12/2018  . Aspiration pneumonia (HCC) 09/12/2018  . Seizure disorder (HCC) 09/12/2018  . Sepsis (HCC) 09/12/2018  . E coli infection 01/15/2017  .  Agitation 01/15/2017  . Diarrhea 01/15/2017  . Hypokalemia 01/15/2017  . Hyponatremia 01/15/2017  . Leukocytosis 01/15/2017  . Hyperglycemia 01/15/2017  . UTI (urinary tract infection) 01/12/2017   PCP:  Baxter Hire, MD Pharmacy:   [place], [place] - [place] [address] [place] [address] Phone: [phone] Fax: [phone]     Social Determinants of Health (SDOH) Interventions     Readmission Risk Interventions No flowsheet data found.

## 2020-05-09 NOTE — Care Management Important Message (Addendum)
Important Message  Patient Details  Name: [name] MRN: [id number] Date of Birth: 08-16-1951   Medicare Important Message Given:  Yes  Copy left in patient's room.  Left message for brother, Jerrik Housholder, at [phone] to review Medicare IM.    Update 6/14 1:24pm: Received return call from brother.  Reviewed Medicare IM.  Copy sent securely to brother at [email or web address].   Johnell Comings 05/09/2020, 12:16 PM

## 2020-05-10 LAB — BASIC METABOLIC PANEL
Anion gap: 8 (ref 5–15)
BUN: 8 mg/dL (ref 8–23)
CO2: 25 mmol/L (ref 22–32)
Calcium: 8.1 mg/dL — ABNORMAL LOW (ref 8.9–10.3)
Chloride: 111 mmol/L (ref 98–111)
Creatinine, Ser: 0.76 mg/dL (ref 0.61–1.24)
GFR calc Af Amer: >60 mL/min (ref >60)
GFR calc non Af Amer: >60 mL/min (ref >60)
Glucose, Bld: 97 mg/dL (ref 70–99)
Potassium: 3.4 mmol/L — ABNORMAL LOW (ref 3.5–5.1)
Sodium: 144 mmol/L (ref 135–145)

## 2020-05-10 LAB — MAGNESIUM: Magnesium: 2.1 mg/dL (ref 1.7–2.4)

## 2020-05-10 MED ORDER — CARBAMAZEPINE ER 200 MG PO TB12: 200.0000 mg | ORAL_TABLET | Freq: Two times a day (BID) | ORAL | 0 refills | Status: AC

## 2020-05-10 MED ORDER — FLUCONAZOLE 100 MG PO TABS
100.0000 mg | ORAL_TABLET | Freq: Every day | ORAL | Status: DC
  Administered 2020-05-10: 100 mg via ORAL
  Filled 2020-05-10: qty 1

## 2020-05-10 MED ORDER — FLUCONAZOLE 100 MG PO TABS: 100.0000 mg | ORAL_TABLET | Freq: Every day | ORAL | 0 refills | Status: DC

## 2020-05-10 MED ORDER — LEVETIRACETAM 500 MG PO TABS
500.0000 mg | ORAL_TABLET | Freq: Two times a day (BID) | ORAL | 0 refills | Status: AC
Start: 2020-05-10 — End: ?

## 2020-05-10 MED ORDER — POTASSIUM CHLORIDE 20 MEQ PO PACK: 20.0000 meq | PACK | Freq: Every day | ORAL | 0 refills | Status: DC

## 2020-05-10 MED ORDER — LAMOTRIGINE 150 MG PO TABS: 150.0000 mg | ORAL_TABLET | Freq: Two times a day (BID) | ORAL | 0 refills | Status: AC

## 2020-05-10 MED ORDER — POLYETHYLENE GLYCOL 3350 17 G PO PACK: 17.0000 g | PACK | Freq: Every day | ORAL | 0 refills | Status: AC

## 2020-05-10 MED ORDER — TAMSULOSIN HCL 0.4 MG PO CAPS: 0.4000 mg | ORAL_CAPSULE | Freq: Every day | ORAL | 0 refills | Status: AC

## 2020-05-10 NOTE — Discharge Summary (Signed)
[facility] [place] [facility]   PATIENT NAME: Richard Gillespie    MR#:  [id number]  DATE OF BIRTH:  1951/10/09  DATE OF ADMISSION:  05/06/2020 ADMITTING PHYSICIAN: Lucile Shutters, MD  DATE OF DISCHARGE: 05/10/2020  PRIMARY CARE PHYSICIAN: Gracelyn Nurse, MD    ADMISSION DIAGNOSIS:  Small bowel obstruction (HCC) [K56.609] SBO (small bowel obstruction) (HCC) [K56.609] Abdominal pain [R10.9]  DISCHARGE DIAGNOSIS:  Principal Problem:   SBO (small bowel obstruction) (HCC) Active Problems:   UTI (urinary tract infection)   Seizure disorder (HCC)   Hypotension   Thrush   Acute urinary retention   Hypomagnesemia   SECONDARY DIAGNOSIS:   Past Medical History:  Diagnosis Date  . Mental retardation   . Seizures (HCC)     HOSPITAL COURSE:   1.  Small bowel obstruction versus ileus.  Seen by surgery and they did not want to do any aggressive management and wanted to do conservative management.  Patient's brother was also wanting to do conservative management.  Patient was having bowel movements and now tolerating diet and stable for discharge home.  We will give MiraLAX on a daily basis. 2.  Hypokalemia and hypomagnesemia.  Magnesium replaced in the normal range.  Potassium still little bit low will give potassium supplementation upon going home for another 5 days. 3.  Hypotension.  Clonidine patch was removed.  Blood pressure stable off blood pressure medication.  Follow-up as outpatient. 4.  Thrush on the tongue.  I prescribed Diflucan IV and will give 5 more days orally upon going home. 5.  UTI on documentation as outpatient.  Prior urine analysis negative here.  Patient received completion of Rocephin course while here in the hospital. 6.  Urinary retention.  I started Flomax 7.  History of seizures on Keppra, carbamazepine and Tegretol. 8.  Weakness.  Patient declined physical therapy yesterday.  DISCHARGE CONDITIONS:   Satisfactory  CONSULTS OBTAINED:   Treatment Team:  Carolan Shiver, MD  DRUG ALLERGIES:   Allergies  Allergen Reactions  . Zithromax [Azithromycin] Other (See Comments)    unknown    DISCHARGE MEDICATIONS:   Allergies as of 05/10/2020      Reactions   Zithromax [azithromycin] Other (See Comments)   unknown      Medication List    STOP taking these medications   cephALEXin 500 MG capsule Commonly known as: KEFLEX   cloNIDine 0.1 mg/24hr patch Commonly known as: CATAPRES - Dosed in mg/24 hr     TAKE these medications   bisacodyl 10 MG suppository Commonly known as: DULCOLAX Place 10 mg rectally as needed for moderate constipation.   carbamazepine 200 MG 12 hr tablet Commonly known as: TEGRETOL XR Take 1 tablet (200 mg total) by mouth 2 (two) times daily. What changed: when to take this   fluconazole 100 MG tablet Commonly known as: DIFLUCAN Take 1 tablet (100 mg total) by mouth daily.   lactulose 10 GM/15ML solution Commonly known as: CHRONULAC Take 10 g by mouth 2 (two) times daily as needed for mild constipation.   lamoTRIgine 150 MG tablet Commonly known as: LAMICTAL Take 1 tablet (150 mg total) by mouth 2 (two) times daily. What changed: how much to take   levETIRAcetam 500 MG tablet Commonly known as: Keppra Take 1 tablet (500 mg total) by mouth 2 (two) times daily.   loratadine 10 MG tablet Commonly known as: CLARITIN Take 10 mg by mouth daily.   magnesium citrate Soln Take 1 Bottle by  mouth daily as needed for severe constipation (at least 2 days of no BM).   medroxyPROGESTERone 10 MG tablet Commonly known as: PROVERA Take 10 mg by mouth daily.   polyethylene glycol 17 g packet Commonly known as: MIRALAX / GLYCOLAX Take 17 g by mouth daily. What changed:   when to take this  reasons to take this   potassium chloride 20 MEQ packet Commonly known as: KLOR-CON Take 20 mEq by mouth daily for 5 days.   senna 8.6 MG Tabs tablet Commonly known as: SENOKOT Take 2  tablets by mouth 2 (two) times daily.   tamsulosin 0.4 MG Caps capsule Commonly known as: FLOMAX Take 1 capsule (0.4 mg total) by mouth daily.   Therems Tabs Take 1 tablet by mouth daily.   traZODone 150 MG tablet Commonly known as: DESYREL Take 300 mg by mouth at bedtime.        DISCHARGE INSTRUCTIONS:   Follow-up PMD 5 days   If you experience worsening of your admission symptoms, develop shortness of breath, life threatening emergency, suicidal or homicidal thoughts you must seek medical attention immediately by calling 911 or calling your MD immediately  if symptoms less severe.  You Must read complete instructions/literature along with all the possible adverse reactions/side effects for all the Medicines you take and that have been prescribed to you. Take any new Medicines after you have completely understood and accept all the possible adverse reactions/side effects.   Please note  You were cared for by a hospitalist during your hospital stay. If you have any questions about your discharge medications or the care you received while you were in the hospital after you are discharged, you can call the unit and asked to speak with the hospitalist on call if the hospitalist that took care of you is not available. Once you are discharged, your primary care physician will handle any further medical issues. Please note that NO REFILLS for any discharge medications will be authorized once you are discharged, as it is imperative that you return to your primary care physician (or establish a relationship with a primary care physician if you do not have one) for your aftercare needs so that they can reassess your need for medications and monitor your lab values.    Today   CHIEF COMPLAINT:   Chief Complaint  Patient presents with  . Abdominal Pain    HISTORY OF PRESENT ILLNESS:  Richard Gillespie  is a 69 y.o. male came in with abdominal pain   VITAL SIGNS:  Blood pressure 133/80,  pulse 69, temperature 97.6 F (36.4 C), temperature source Oral, resp. rate 20, height 5\' 8"  (1.727 m), weight 65.8 kg, SpO2 95 %.  I/O:    Intake/Output Summary (Last 24 hours) at 05/10/2020 0827 Last data filed at 05/10/2020 0119 Gross per 24 hour  Intake 120 ml  Output 1100 ml  Net -980 ml     PHYSICAL EXAMINATION:  GENERAL:  69 y.o.-year-old patient lying in the bed with no acute distress.  EYES: Pupils equal, round, reactive to light and accommodation. No scleral icterus. Extraocular muscles intact.  HEENT: Head atraumatic, normocephalic. Oropharynx and nasopharynx clear.   LUNGS: Normal breath sounds bilaterally, no wheezing, rales,rhonchi or crepitation. No use of accessory muscles of respiration.  CARDIOVASCULAR: S1, S2 normal. No murmurs, rubs, or gallops.  ABDOMEN: Soft, non-tender, non-distended. No organomegaly or mass.  EXTREMITIES: No pedal edema, cyanosis, or clubbing.  NEUROLOGIC: Patient moves all extremities on his own PSYCHIATRIC: The  patient is alert and yells "no" to most of my questions.  SKIN: No obvious rash, lesion, or ulcer.   DATA REVIEW:   CBC Recent Labs  Lab 05/07/20 0501  WBC 10.0  HGB 12.0*  HCT 35.7*  PLT 211    Chemistries  Recent Labs  Lab 05/06/20 1117 05/07/20 0501 05/10/20 0443  NA 146*   < > 144  K 3.9   < > 3.4*  CL 104   < > 111  CO2 28   < > 25  GLUCOSE 136*   < > 97  BUN 20   < > 8  CREATININE 0.94   < > 0.76  CALCIUM 10.0   < > 8.1*  MG  --    < > 2.1  AST 33  --   --   ALT 25  --   --   ALKPHOS 60  --   --   BILITOT 0.9  --   --    < > = values in this interval not displayed.    Microbiology Results  Results for orders placed or performed during the hospital encounter of 05/06/20  SARS Coronavirus 2 by RT PCR (hospital order, performed in [facility] hospital lab) Nasopharyngeal Nasopharyngeal Swab     Status: None   Collection Time: 05/06/20  8:06 PM   Specimen: Nasopharyngeal Swab  Result Value Ref Range  Status   SARS Coronavirus 2 NEGATIVE NEGATIVE Final    Comment: (NOTE) SARS-CoV-2 target nucleic acids are NOT DETECTED.  The SARS-CoV-2 RNA is generally detectable in upper and lower respiratory specimens during the acute phase of infection. The lowest concentration of SARS-CoV-2 viral copies this assay can detect is 250 copies / mL. A negative result does not preclude SARS-CoV-2 infection and should not be used as the sole basis for treatment or other patient management decisions.  A negative result may occur with improper specimen collection / handling, submission of specimen other than nasopharyngeal swab, presence of viral mutation(s) within the areas targeted by this assay, and inadequate number of viral copies (<250 copies / mL). A negative result must be combined with clinical observations, patient history, and epidemiological information.  Fact Sheet for Patients:   BoilerBrush.com.cy  Fact Sheet for Healthcare Providers: https://pope.com/  This test is not yet approved or  cleared by the [place] FDA and has been authorized for detection and/or diagnosis of SARS-CoV-2 by FDA under an Emergency Use Authorization (EUA).  This EUA will remain in effect (meaning this test can be used) for the duration of the COVID-19 declaration under Section 564(b)(1) of the Act, 21 U.S.C. section 360bbb-3(b)(1), unless the authorization is terminated or revoked sooner.  Performed at [facility], [address], [place], [place] [address]      Management plans discussed with the patient, family and they are in agreement.  CODE STATUS:     Code Status Orders  (From admission, onward)         Start     Ordered   05/06/20 1632  Do not attempt resuscitation (DNR)  Continuous       Question Answer Comment  In the event of cardiac or respiratory ARREST Do not call a "code blue"   In the event of cardiac or respiratory  ARREST Do not perform Intubation, CPR, defibrillation or ACLS   In the event of cardiac or respiratory ARREST Use medication by any route, position, wound care, and other measures to relive pain and suffering.  May use oxygen, suction and manual treatment of airway obstruction as needed for comfort.   Comments Code status was discussed with his legal guardian Pierson Vantol who wants him placed on a DNR status      05/06/20 1632        Code Status History    Date Active Date Inactive Code Status Order ID Comments User Context   05/06/2020 1522 05/06/2020 1632 Full Code [id number]  Collier Bullock, MD ED   08/03/2019 1115 08/17/2019 1911 DNR [id number]  Lang Snow, NP ED   09/19/2018 1221 09/24/2018 1625 DNR [id number]  Bettey Costa, MD Inpatient   09/12/2018 2321 09/19/2018 1221 Full Code [id number]  Lance Coon, MD Inpatient   01/12/2017 1647 01/15/2017 1928 Full Code [id number]  Epifanio Lesches, MD ED   Advance Care Planning Activity      TOTAL TIME TAKING CARE OF THIS PATIENT: 32 minutes.    Loletha Grayer M.D on 05/10/2020 at 8:27 AM  Between 7am to 6pm - Pager - [phone]  After 6pm go to [email or web address] - password [email or web address]  [facility]  CC: Primary care physician; Baxter Hire, MD

## 2020-05-10 NOTE — Progress Notes (Signed)
Retia Passe Alert. VSS. Pt tolerating diet well. No complaints of nausea or vomiting. IV removed intact, prescriptions given. Discharge instructions sent with patient. Patient discharged via wheelchair with RN and NT  Allergies as of 05/10/2020      Reactions   Zithromax [azithromycin] Other (See Comments)   unknown      Medication List    STOP taking these medications   cephALEXin 500 MG capsule Commonly known as: KEFLEX   cloNIDine 0.1 mg/24hr patch Commonly known as: CATAPRES - Dosed in mg/24 hr     TAKE these medications   bisacodyl 10 MG suppository Commonly known as: DULCOLAX Place 10 mg rectally as needed for moderate constipation.   carbamazepine 200 MG 12 hr tablet Commonly known as: TEGRETOL XR Take 1 tablet (200 mg total) by mouth 2 (two) times daily. What changed: when to take this   fluconazole 100 MG tablet Commonly known as: DIFLUCAN Take 1 tablet (100 mg total) by mouth daily.   lactulose 10 GM/15ML solution Commonly known as: CHRONULAC Take 10 g by mouth 2 (two) times daily as needed for mild constipation.   lamoTRIgine 150 MG tablet Commonly known as: LAMICTAL Take 1 tablet (150 mg total) by mouth 2 (two) times daily. What changed: how much to take   levETIRAcetam 500 MG tablet Commonly known as: Keppra Take 1 tablet (500 mg total) by mouth 2 (two) times daily.   loratadine 10 MG tablet Commonly known as: CLARITIN Take 10 mg by mouth daily.   magnesium citrate Soln Take 1 Bottle by mouth daily as needed for severe constipation (at least 2 days of no BM).   medroxyPROGESTERone 10 MG tablet Commonly known as: PROVERA Take 10 mg by mouth daily.   polyethylene glycol 17 g packet Commonly known as: MIRALAX / GLYCOLAX Take 17 g by mouth daily. What changed:   when to take this  reasons to take this   potassium chloride 20 MEQ packet Commonly known as: KLOR-CON Take 20 mEq by mouth daily for 5 days.   senna 8.6 MG Tabs tablet Commonly  known as: SENOKOT Take 2 tablets by mouth 2 (two) times daily.   tamsulosin 0.4 MG Caps capsule Commonly known as: FLOMAX Take 1 capsule (0.4 mg total) by mouth daily.   Therems Tabs Take 1 tablet by mouth daily.   traZODone 150 MG tablet Commonly known as: DESYREL Take 300 mg by mouth at bedtime.       Vitals:   05/09/20 2003 05/10/20 1219  BP: 133/80 112/88  Pulse: 69 77  Resp: 20   Temp: 97.6 F (36.4 C)   SpO2: 95%     Braileigh Landenberger C Chapin Arduini

## 2020-05-10 NOTE — Discharge Instructions (Signed)
Bowel Obstruction °A bowel obstruction means that something is blocking the small or large bowel. The bowel is also called the intestine. It is the long tube that connects the stomach to the opening of the butt (anus). When something blocks the bowel, food and fluids cannot pass through like normal. This condition needs to be treated. Treatment depends on the cause of the problem and how bad the problem is. °What are the causes? °Common causes of this condition include: °· Scar tissue (adhesions) from past surgery or from high-energy X-rays (radiation). °· Recent surgery in the belly. This affects how food moves in the bowel. °· Some diseases, such as: °? Irritation of the lining of the digestive tract (Crohn's disease). °? Irritation of small pouches in the bowel (diverticulitis). °· Growths or tumors. °· A bulging organ (hernia). °· Twisting of the bowel (volvulus). °· A foreign body. °· Slipping of a part of the bowel into another part (intussusception). °What are the signs or symptoms? °Symptoms of this condition include: °· Pain in the belly. °· Feeling sick to your stomach (nauseous). °· Throwing up (vomiting). °· Bloating in the belly. °· Being unable to pass gas. °· Trouble pooping (constipation). °· Watery poop (diarrhea). °· A lot of belching. °How is this diagnosed? °This condition may be diagnosed based on: °· A physical exam. °· Medical history. °· Imaging tests, such as X-ray or CT scan. °· Blood tests. °· Urine tests. °How is this treated? °Treatment for this condition may include: °· Fluids and pain medicines that are given through an IV tube. Your doctor may tell you not to eat or drink if you feel sick to your stomach and are throwing up. °· Eating a clear liquid diet for a few days. °· Putting a small tube (nasogastric tube) into the stomach. This will help with pain, discomfort, and nausea by removing blocked air and fluids from the stomach. °· Surgery. This may be needed if other treatments do  not work. °Follow these instructions at home: °Medicines °· Take over-the-counter and prescription medicines only as told by your doctor. °· If you were prescribed an antibiotic medicine, take it as told by your doctor. Do not stop taking the antibiotic even if you start to feel better. °General instructions °· Follow your diet as told by your doctor. You may need to: °? Only drink clear liquids until you start to get better. °? Avoid solid foods. °· Return to your normal activities as told by your doctor. Ask your doctor what activities are safe for you. °· Do not sit for a long time without moving. Get up to take short walks every 1-2 hours. This is important. Ask for help if you feel weak or unsteady. °· Keep all follow-up visits as told by your doctor. This is important. °How is this prevented? °After having a bowel obstruction, you may be more likely to have another. You can do some things to stop it from happening again. °· If you have a long-term (chronic) disease, contact your doctor if you see changes or problems. °· Take steps to prevent or treat trouble pooping. Your doctor may ask that you: °? Drink enough fluid to keep your pee (urine) pale yellow. °? Take over-the-counter or prescription medicines. °? Eat foods that are high in fiber. These include beans, whole grains, and fresh fruits and vegetables. °? Limit foods that are high in fat and sugar. These include fried or sweet foods. °· Stay active. Ask your doctor which exercises are   safe for you. °· Avoid stress. °· Eat three small meals and three small snacks each day. °· Work with a food expert (dietitian) to make a meal plan that works for you. °· Do not use any products that contain nicotine or tobacco, such as cigarettes and e-cigarettes. If you need help quitting, ask your doctor. °Contact a doctor if: °· You have a fever. °· You have chills. °Get help right away if: °· You have pain or cramps that get worse. °· You throw up blood. °· You are  sick to your stomach. °· You cannot stop throwing up. °· You cannot drink fluids. °· You feel mixed up (confused). °· You feel very thirsty (dehydrated). °· Your belly gets more bloated. °· You feel weak or you pass out (faint). °Summary °· A bowel obstruction means that something is blocking the small or large bowel. °· Treatment may include IV fluids and pain medicine. You may also have a clear liquid diet, a small tube in your stomach, or surgery. °· Drink clear liquids and avoid solid foods until you get better. °This information is not intended to replace advice given to you by your health care provider. Make sure you discuss any questions you have with your health care provider. °Document Revised: 03/26/2018 Document Reviewed: 03/26/2018 °Elsevier Patient Education © 2020 [employer] Inc. ° °

## 2020-05-10 NOTE — Progress Notes (Signed)
PT Cancellation Note  Patient Details Name: Richard Gillespie MRN: [id number] DOB: 12-Jan-1951   Cancelled Treatment:    Reason Eval/Treat Not Completed: Patient declined, no reason specified.  Pt resting in bed upon PT arrival.  Initially pt appearing calm and verbalizing a little with therapist.  When therapist asked pt to get up to walk pt immediately stated "no", "go to sleep" (referring to himself), and pt reached out (appearing to attempt to push therapist away); pt then perseverating on stating "no" and "go to sleep" and refusing to attempt to get OOB.  Will re-attempt PT evaluation at a later date/time as able.    Hendricks Limes, PT 05/10/20, 9:58 AM

## 2020-05-10 NOTE — NC FL2 (Signed)
[place] LEVEL OF CARE SCREENING TOOL     IDENTIFICATION  Patient Name: Richard Gillespie Birthdate: Dec 24, 1950 Sex: male Admission Date (Current Location): 05/06/2020  [facility] and [place] Number:  Engineering geologist and Address:         Provider Number: [id number]  Attending Physician Name and Address:  Loletha Grayer, MD  Relative Name and Phone Number:       Current Level of Care: Hospital Recommended Level of Care: [place] (Group home) Prior Approval Number:    Date Approved/Denied:   PASRR Number:    Discharge Plan: Domiciliary (Rest home) (group home)    Current Diagnoses: Patient Active Problem List   Diagnosis Date Noted   Hypomagnesemia    Hypotension    Thrush    Acute urinary retention    Small bowel obstruction ([place]) 08/03/2019   Malnutrition of moderate degree 09/17/2018   SBO (small bowel obstruction) ([place]) 09/12/2018   Aspiration pneumonia ([place]) 09/12/2018   Seizure disorder ([place]) 09/12/2018   Sepsis ([place]) 09/12/2018   E coli infection 01/15/2017   Agitation 01/15/2017   Diarrhea 01/15/2017   Hypokalemia 01/15/2017   Hyponatremia 01/15/2017   Leukocytosis 01/15/2017   Hyperglycemia 01/15/2017   UTI (urinary tract infection) 01/12/2017    Orientation RESPIRATION BLADDER Height & Weight     Self  Normal Continent Weight: 65.8 kg Height:  5\' 8"  (172.7 cm)  BEHAVIORAL SYMPTOMS/MOOD NEUROLOGICAL BOWEL NUTRITION STATUS      Incontinent Diet (dysphagia 3 diet with thin liquids)  AMBULATORY STATUS COMMUNICATION OF NEEDS Skin   Supervision Verbally Normal                       Personal Care Assistance Level of Assistance              Functional Limitations Info             SPECIAL CARE FACTORS FREQUENCY                       Contractures Contractures Info: Not present    Additional Factors Info  Code Status, Allergies Code Status Info: DNR Allergies Info: Zithromax            Current Medications (05/10/2020):  This is the current hospital active medication list Current Facility-Administered Medications  Medication Dose Route Frequency Provider Last Rate Last Admin   0.9 %  sodium chloride infusion   Intravenous PRN Wieting, Richard, MD       carbamazepine (TEGRETOL XR) 12 hr tablet 200 mg  200 mg Oral BID Loletha Grayer, MD   200 mg at 05/09/20 2133   cefTRIAXone (ROCEPHIN) 1 g in sodium chloride 0.9 % 100 mL IVPB  1 g Intravenous Q24H Loletha Grayer, MD 200 mL/hr at 05/10/20 0501 1 g at 05/10/20 0501   fluconazole (DIFLUCAN) tablet 100 mg  100 mg Oral Daily Wieting, Richard, MD       lamoTRIgine (LAMICTAL) tablet 150 mg  150 mg Oral BID Loletha Grayer, MD   150 mg at 05/09/20 2132   levETIRAcetam (KEPPRA) IVPB 500 mg/100 mL premix  500 mg Intravenous Q12H Agbata, Tochukwu, MD 400 mL/hr at 05/10/20 0329 500 mg at 05/10/20 0329   LORazepam (ATIVAN) injection 2 mg  2 mg Intravenous Q6H PRN Agbata, Tochukwu, MD       medroxyPROGESTERone (PROVERA) tablet 10 mg  10 mg Oral Daily Loletha Grayer, MD   10 mg at  05/09/20 1025   morphine 2 MG/ML injection 2 mg  2 mg Intravenous Q4H PRN Agbata, Tochukwu, MD       ondansetron (ZOFRAN) tablet 4 mg  4 mg Oral Q6H PRN Agbata, Tochukwu, MD       Or   ondansetron (ZOFRAN) injection 4 mg  4 mg Intravenous Q6H PRN Agbata, Tochukwu, MD       pantoprazole (PROTONIX) injection 40 mg  40 mg Intravenous Q24H Agbata, Tochukwu, MD   40 mg at 05/09/20 1622   potassium chloride (KLOR-CON) packet 20 mEq  20 mEq Oral BID Alford Highland, MD   20 mEq at 05/09/20 2133   sodium chloride flush (NS) 0.9 % injection 3 mL  3 mL Intravenous Once Arnaldo Natal, MD       tamsulosin [facility]) capsule 0.4 mg  0.4 mg Oral Daily Alford Highland, MD   0.4 mg at 05/09/20 1028   traZODone (DESYREL) tablet 300 mg  300 mg Oral QHS Alford Highland, MD   300 mg at 05/09/20 2133     Discharge Medications: Please see discharge  summary for a list of discharge medications.  Relevant Imaging Results:  Relevant Lab Results:   Additional Information    Flonnie Wierman, Julio Alm, RN

## 2020-05-10 NOTE — TOC Progression Note (Signed)
Transition of Care [facility]) - Progression Note    Patient Details  Name: KEY CEN MRN: [id number] Date of Birth: Apr 20, 1951  Transition of Care [facility]) CM/SW Contact  Chapman Fitch, RN Phone Number: 05/10/2020, 10:09 AM  Clinical Narrative:     Notified facility nurse Ulyess Blossom of discharge Fl2 and DC summary Faxed to group home coordinator Ysidro Evert.   They will review the information to make sure they have everything they need, then call me back to arrange transportation Expected Discharge Plan: Group Home Barriers to Discharge: Continued Medical Work up  Expected Discharge Plan and Services Expected Discharge Plan: Group Home       Living arrangements for the past 2 months: Group Home Expected Discharge Date: 05/10/20                                     Social Determinants of Health (SDOH) Interventions    Readmission Risk Interventions No flowsheet data found.

## 2020-05-10 NOTE — TOC Transition Note (Signed)
Transition of Care [facility]) - CM/SW Discharge Note   Patient Details  Name: Richard Gillespie MRN: [id number] Date of Birth: [birth year]  [facility]) CM/SW Contact:  Chapman Fitch, RN Phone Number: 05/10/2020, 12:59 PM   Clinical Narrative:     Received return call from Ysidro Evert Group home coordinator.  Patient will be able to return today  They have arranged transportation for today at 4pm.   Bedside RN has notified brother who is legal guardian .    Final next level of care: Group Home Barriers to Discharge: No Barriers Identified   Patient Goals and CMS Choice        Discharge Placement                       Discharge Plan and Services                                     Social Determinants of Health (SDOH) Interventions     Readmission Risk Interventions No flowsheet data found.

## 2020-05-10 NOTE — NC FL2 (Signed)
[place] MEDICAID FL2 LEVEL OF CARE SCREENING TOOL     IDENTIFICATION  Patient Name: Richard Gillespie Birthdate: 06/21/1951 Sex: male Admission Date (Current Location): 05/06/2020  [facility] and [place] Number:  Chiropodist and Address:         Provider Number: [id number]  Attending Physician Name and Address:  Alford Highland, MD  Relative Name and Phone Number:       Current Level of Care: Hospital Recommended Level of Care: [facility] Home (Group home) Prior Approval Number:    Date Approved/Denied:   PASRR Number:    Discharge Plan: Domiciliary (Rest home) (group home)    Current Diagnoses: Patient Active Problem List   Diagnosis Date Noted  . Hypomagnesemia   . Hypotension   . Thrush   . Acute urinary retention   . Small bowel obstruction ([facility]) 08/03/2019  . Malnutrition of moderate degree 09/17/2018  . SBO (small bowel obstruction) ([facility]) 09/12/2018  . Aspiration pneumonia ([facility]) 09/12/2018  . Seizure disorder ([facility]) 09/12/2018  . Sepsis ([facility]) 09/12/2018  . E coli infection 01/15/2017  . Agitation 01/15/2017  . Diarrhea 01/15/2017  . Hypokalemia 01/15/2017  . Hyponatremia 01/15/2017  . Leukocytosis 01/15/2017  . Hyperglycemia 01/15/2017  . UTI (urinary tract infection) 01/12/2017    Orientation RESPIRATION BLADDER Height & Weight     Self  Normal Continent Weight: 65.8 kg Height:  5\' 8"  (172.7 cm)  BEHAVIORAL SYMPTOMS/MOOD NEUROLOGICAL BOWEL NUTRITION STATUS      Incontinent Diet (dysphagia 3 diet with thin liquids)  AMBULATORY STATUS COMMUNICATION OF NEEDS Skin   Supervision Verbally Normal                       Personal Care Assistance Level of Assistance              Functional Limitations Info             SPECIAL CARE FACTORS FREQUENCY                       Contractures Contractures Info: Not present    Additional Factors Info  Code Status, Allergies Code Status Info: DNR Allergies Info: Zithromax            Current Medications (05/10/2020):  This is the current hospital active medication list Current Facility-Administered Medications  Medication Dose Route Frequency Provider Last Rate Last Admin  . 0.9 %  sodium chloride infusion   Intravenous PRN 05/12/2020, MD      . carbamazepine (TEGRETOL XR) 12 hr tablet 200 mg  200 mg Oral BID Alford Highland, MD   200 mg at 05/09/20 2133  . cefTRIAXone (ROCEPHIN) 1 g in sodium chloride 0.9 % 100 mL IVPB  1 g Intravenous Q24H 2134, MD 200 mL/hr at 05/10/20 0501 1 g at 05/10/20 0501  . fluconazole (DIFLUCAN) tablet 100 mg  100 mg Oral Daily Wieting, Richard, MD      . lamoTRIgine (LAMICTAL) tablet 150 mg  150 mg Oral BID 05/12/20, MD   150 mg at 05/09/20 2132  . levETIRAcetam (KEPPRA) IVPB 500 mg/100 mL premix  500 mg Intravenous Q12H Agbata, Tochukwu, MD 400 mL/hr at 05/10/20 0329 500 mg at 05/10/20 0329  . LORazepam (ATIVAN) injection 2 mg  2 mg Intravenous Q6H PRN Agbata, Tochukwu, MD      . medroxyPROGESTERone (PROVERA) tablet 10 mg  10 mg Oral Daily 05/12/20, MD   10 mg at  05/09/20 1025  . morphine 2 MG/ML injection 2 mg  2 mg Intravenous Q4H PRN Agbata, Tochukwu, MD      . ondansetron (ZOFRAN) tablet 4 mg  4 mg Oral Q6H PRN Agbata, Tochukwu, MD       Or  . ondansetron (ZOFRAN) injection 4 mg  4 mg Intravenous Q6H PRN Agbata, Tochukwu, MD      . pantoprazole (PROTONIX) injection 40 mg  40 mg Intravenous Q24H Agbata, Tochukwu, MD   40 mg at 05/09/20 1622  . potassium chloride (KLOR-CON) packet 20 mEq  20 mEq Oral BID Loletha Grayer, MD   20 mEq at 05/09/20 2133  . sodium chloride flush (NS) 0.9 % injection 3 mL  3 mL Intravenous Once Nena Polio, MD      . tamsulosin [facility]) capsule 0.4 mg  0.4 mg Oral Daily Loletha Grayer, MD   0.4 mg at 05/09/20 1028  . traZODone (DESYREL) tablet 300 mg  300 mg Oral QHS Loletha Grayer, MD   300 mg at 05/09/20 2133     Discharge Medications: Medication List     STOP taking these medications   cephALEXin 500 MG capsule Commonly known as: KEFLEX   cloNIDine 0.1 mg/24hr patch Commonly known as: CATAPRES - Dosed in mg/24 hr     TAKE these medications   bisacodyl 10 MG suppository Commonly known as: DULCOLAX Place 10 mg rectally as needed for moderate constipation.   carbamazepine 200 MG 12 hr tablet Commonly known as: TEGRETOL XR Take 1 tablet (200 mg total) by mouth 2 (two) times daily. What changed: when to take this   fluconazole 100 MG tablet Commonly known as: DIFLUCAN Take 1 tablet (100 mg total) by mouth daily.   lactulose 10 GM/15ML solution Commonly known as: CHRONULAC Take 10 g by mouth 2 (two) times daily as needed for mild constipation.   lamoTRIgine 150 MG tablet Commonly known as: LAMICTAL Take 1 tablet (150 mg total) by mouth 2 (two) times daily. What changed: how much to take   levETIRAcetam 500 MG tablet Commonly known as: Keppra Take 1 tablet (500 mg total) by mouth 2 (two) times daily.   loratadine 10 MG tablet Commonly known as: CLARITIN Take 10 mg by mouth daily.   magnesium citrate Soln Take 1 Bottle by mouth daily as needed for severe constipation (at least 2 days of no BM).   medroxyPROGESTERone 10 MG tablet Commonly known as: PROVERA Take 10 mg by mouth daily.   polyethylene glycol 17 g packet Commonly known as: MIRALAX / GLYCOLAX Take 17 g by mouth daily. What changed:   when to take this  reasons to take this   potassium chloride 20 MEQ packet Commonly known as: KLOR-CON Take 20 mEq by mouth daily for 5 days.   senna 8.6 MG Tabs tablet Commonly known as: SENOKOT Take 2 tablets by mouth 2 (two) times daily.   tamsulosin 0.4 MG Caps capsule Commonly known as: FLOMAX Take 1 capsule (0.4 mg total) by mouth daily.   Therems Tabs Take 1 tablet by mouth daily.   traZODone 150 MG tablet Commonly known as: DESYREL Take 300 mg by mouth at bedtime  - Standard PRN      Relevant Imaging Results:  Relevant Lab Results:   Additional Information    Ishmael Berkovich, Illene Silver, RN

## 2020-12-10 ENCOUNTER — Other Ambulatory Visit: Payer: Self-pay

## 2020-12-10 ENCOUNTER — Emergency Department: Payer: Medicare Other

## 2020-12-10 ENCOUNTER — Encounter: Payer: Self-pay | Admitting: *Deleted

## 2020-12-10 ENCOUNTER — Inpatient Hospital Stay
Admission: EM | Admit: 2020-12-10 | Discharge: 2020-12-13 | DRG: 389 | Disposition: A | Discharge status: Home / Self Care | Payer: Medicare Other | Attending: Internal Medicine | Admitting: Internal Medicine

## 2020-12-10 DIAGNOSIS — F819 Developmental disorder of scholastic skills, unspecified: Secondary | ICD-10-CM

## 2020-12-10 DIAGNOSIS — K30 Functional dyspepsia: Secondary | ICD-10-CM | POA: Diagnosis present

## 2020-12-10 DIAGNOSIS — N400 Enlarged prostate without lower urinary tract symptoms: Secondary | ICD-10-CM | POA: Diagnosis present

## 2020-12-10 DIAGNOSIS — F79 Unspecified intellectual disabilities: Secondary | ICD-10-CM

## 2020-12-10 DIAGNOSIS — R451 Restlessness and agitation: Secondary | ICD-10-CM | POA: Diagnosis present

## 2020-12-10 DIAGNOSIS — K56609 Unspecified intestinal obstruction, unspecified as to partial versus complete obstruction: Secondary | ICD-10-CM

## 2020-12-10 DIAGNOSIS — K567 Ileus, unspecified: Secondary | ICD-10-CM | POA: Diagnosis not present

## 2020-12-10 DIAGNOSIS — F72 Severe intellectual disabilities: Secondary | ICD-10-CM | POA: Diagnosis present

## 2020-12-10 DIAGNOSIS — E876 Hypokalemia: Secondary | ICD-10-CM | POA: Diagnosis present

## 2020-12-10 DIAGNOSIS — E860 Dehydration: Secondary | ICD-10-CM | POA: Diagnosis present

## 2020-12-10 DIAGNOSIS — Z79899 Other long term (current) drug therapy: Secondary | ICD-10-CM

## 2020-12-10 DIAGNOSIS — G40909 Epilepsy, unspecified, not intractable, without status epilepticus: Secondary | ICD-10-CM | POA: Diagnosis present

## 2020-12-10 DIAGNOSIS — Z20822 Contact with and (suspected) exposure to covid-19: Secondary | ICD-10-CM | POA: Diagnosis present

## 2020-12-10 DIAGNOSIS — R197 Diarrhea, unspecified: Secondary | ICD-10-CM

## 2020-12-10 DIAGNOSIS — Z881 Allergy status to other antibiotic agents status: Secondary | ICD-10-CM

## 2020-12-10 DIAGNOSIS — Z66 Do not resuscitate: Secondary | ICD-10-CM | POA: Diagnosis present

## 2020-12-10 LAB — CBC WITH DIFFERENTIAL/PLATELET
Abs Immature Granulocytes: 0.02 10*3/uL (ref 0.00–0.07)
Basophils Absolute: 0 10*3/uL (ref 0.0–0.1)
Basophils Relative: 0 %
Eosinophils Absolute: 0.1 10*3/uL (ref 0.0–0.5)
Eosinophils Relative: 1 %
HCT: 42.2 % (ref 39.0–52.0)
Hemoglobin: 14.1 g/dL (ref 13.0–17.0)
Immature Granulocytes: 0 %
Lymphocytes Relative: 15 %
Lymphs Abs: 1.6 10*3/uL (ref 0.7–4.0)
MCH: 29.4 pg (ref 26.0–34.0)
MCHC: 33.4 g/dL (ref 30.0–36.0)
MCV: 87.9 fL (ref 80.0–100.0)
Monocytes Absolute: 1 10*3/uL (ref 0.1–1.0)
Monocytes Relative: 9 %
Neutro Abs: 7.5 10*3/uL (ref 1.7–7.7)
Neutrophils Relative %: 75 %
Platelets: 215 10*3/uL (ref 150–400)
RBC: 4.8 MIL/uL (ref 4.22–5.81)
RDW: 12.4 % (ref 11.5–15.5)
WBC: 10.2 10*3/uL (ref 4.0–10.5)
nRBC: 0 % (ref 0.0–0.2)

## 2020-12-10 LAB — RESP PANEL BY RT-PCR (FLU A&B, COVID) ARPGX2
Influenza A by PCR: NEGATIVE
Influenza B by PCR: NEGATIVE
SARS Coronavirus 2 by RT PCR: NEGATIVE

## 2020-12-10 LAB — URINALYSIS, COMPLETE (UACMP) WITH MICROSCOPIC
Bilirubin Urine: NEGATIVE
Glucose, UA: NEGATIVE mg/dL
Hgb urine dipstick: NEGATIVE
Ketones, ur: 5 mg/dL — AB
Leukocytes,Ua: NEGATIVE
Nitrite: NEGATIVE
Protein, ur: 100 mg/dL — AB
Specific Gravity, Urine: 1.025 (ref 1.005–1.030)
pH: 5 (ref 5.0–8.0)

## 2020-12-10 LAB — COMPREHENSIVE METABOLIC PANEL
ALT: 24 U/L (ref 0–44)
AST: 31 U/L (ref 15–41)
Albumin: 4.4 g/dL (ref 3.5–5.0)
Alkaline Phosphatase: 51 U/L (ref 38–126)
Anion gap: 14 (ref 5–15)
BUN: 19 mg/dL (ref 8–23)
CO2: 24 mmol/L (ref 22–32)
Calcium: 9.1 mg/dL (ref 8.9–10.3)
Chloride: 98 mmol/L (ref 98–111)
Creatinine, Ser: 1.06 mg/dL (ref 0.61–1.24)
GFR, Estimated: >60 mL/min (ref >60)
Glucose, Bld: 108 mg/dL — ABNORMAL HIGH (ref 70–99)
Potassium: 3.3 mmol/L — ABNORMAL LOW (ref 3.5–5.1)
Sodium: 136 mmol/L (ref 135–145)
Total Bilirubin: 0.9 mg/dL (ref 0.3–1.2)
Total Protein: 7.5 g/dL (ref 6.5–8.1)

## 2020-12-10 LAB — LIPASE, BLOOD: Lipase: 23 U/L (ref 11–51)

## 2020-12-10 MED ORDER — HEPARIN SODIUM (PORCINE) 5000 UNIT/ML IJ SOLN
5000.0000 [IU] | Freq: Three times a day (TID) | INTRAMUSCULAR | Status: DC
  Administered 2020-12-10 – 2020-12-12 (×6): 5000 [IU] via SUBCUTANEOUS
  Filled 2020-12-10 (×6): qty 1

## 2020-12-10 MED ORDER — LACTATED RINGERS IV SOLN: INTRAVENOUS | Status: AC

## 2020-12-10 MED ORDER — TAMSULOSIN HCL 0.4 MG PO CAPS
0.4000 mg | ORAL_CAPSULE | Freq: Every day | ORAL | Status: DC
Start: 2020-12-11 — End: 2020-12-13
  Administered 2020-12-12 – 2020-12-13 (×2): 0.4 mg via ORAL
  Filled 2020-12-10 (×3): qty 1

## 2020-12-10 MED ORDER — ACETAMINOPHEN 650 MG RE SUPP: 325.0000 mg | Freq: Four times a day (QID) | RECTAL | Status: DC | PRN

## 2020-12-10 MED ORDER — CARBAMAZEPINE ER 200 MG PO TB12
200.0000 mg | ORAL_TABLET | Freq: Two times a day (BID) | ORAL | Status: DC
  Administered 2020-12-10 – 2020-12-13 (×5): 200 mg via ORAL
  Filled 2020-12-10 (×7): qty 1

## 2020-12-10 MED ORDER — LACTULOSE 10 GM/15ML PO SOLN: 10.0000 g | Freq: Two times a day (BID) | ORAL | Status: DC | PRN

## 2020-12-10 MED ORDER — LAMOTRIGINE 25 MG PO TABS
150.0000 mg | ORAL_TABLET | Freq: Two times a day (BID) | ORAL | Status: DC
  Administered 2020-12-10 – 2020-12-13 (×5): 150 mg via ORAL
  Filled 2020-12-10 (×6): qty 1

## 2020-12-10 MED ORDER — BISACODYL 10 MG RE SUPP: 10.0000 mg | RECTAL | Status: DC | PRN

## 2020-12-10 MED ORDER — ONDANSETRON HCL 4 MG/2ML IJ SOLN: 4.0000 mg | Freq: Four times a day (QID) | INTRAMUSCULAR | Status: DC | PRN

## 2020-12-10 MED ORDER — POLYETHYLENE GLYCOL 3350 17 G PO PACK
17.0000 g | PACK | Freq: Every day | ORAL | Status: DC
  Administered 2020-12-13: 17 g via ORAL
  Filled 2020-12-10 (×2): qty 1

## 2020-12-10 MED ORDER — MEDROXYPROGESTERONE ACETATE 10 MG PO TABS
10.0000 mg | ORAL_TABLET | Freq: Every day | ORAL | Status: DC
  Administered 2020-12-12 – 2020-12-13 (×2): 10 mg via ORAL
  Filled 2020-12-10 (×3): qty 1

## 2020-12-10 MED ORDER — LEVETIRACETAM 500 MG PO TABS
500.0000 mg | ORAL_TABLET | Freq: Two times a day (BID) | ORAL | Status: DC
  Administered 2020-12-10 – 2020-12-13 (×5): 500 mg via ORAL
  Filled 2020-12-10 (×6): qty 1

## 2020-12-10 MED ORDER — ONDANSETRON HCL 4 MG PO TABS: 4.0000 mg | ORAL_TABLET | Freq: Four times a day (QID) | ORAL | Status: DC | PRN

## 2020-12-10 MED ORDER — ACETAMINOPHEN 325 MG PO TABS: 650.0000 mg | ORAL_TABLET | Freq: Four times a day (QID) | ORAL | Status: DC | PRN

## 2020-12-10 MED ORDER — TRAZODONE HCL 100 MG PO TABS
300.0000 mg | ORAL_TABLET | Freq: Every day | ORAL | Status: DC
  Administered 2020-12-10 – 2020-12-12 (×3): 300 mg via ORAL
  Filled 2020-12-10 (×3): qty 3

## 2020-12-10 MED ORDER — MAGNESIUM CITRATE PO SOLN: 1.0000 | Freq: Every day | ORAL | Status: DC | PRN

## 2020-12-10 NOTE — ED Notes (Signed)
X-ray at bedside

## 2020-12-10 NOTE — ED Notes (Signed)
Provided 2 cups water per EDP verbal order. Pt caregiver to give water so pt can void for urine sample.

## 2020-12-10 NOTE — ED Notes (Signed)
Pt and caregiver updated on bed status. Caregiver states she has to leave and cannot stay with pt. She states pt does not attempt to get up without assist. Call bell at right side of pt. Lights dimmed per pt request.

## 2020-12-10 NOTE — ED Notes (Signed)
EDP at bedside  

## 2020-12-10 NOTE — ED Notes (Signed)
Bed adjusted from comfort. Awaiting pharmacy to verify 2000 medications to give.

## 2020-12-10 NOTE — Progress Notes (Addendum)
Patient with prior admission for ?SBO in June 2021.  Treated non-operatively. Brought to ED by caregiver due to nausea, vomiting, diarrhea.  ED provider ordered plain film that showed dilated loops of bowel.  Review of prior films (going back to 2018) shows virtually identical imaging. Has a history of ex lap with adhesiolysis by Dr. Maia Plan in 2020. Currently, has consumed 2 large cups of water without emesis while in the ED. ? Whether or not he has chronic dysmotility issues versus gastroenteritis versus true SBO.  Recommend admission to medicine.  Can hold off on NGT unless he develops n/v.  Full consult note to follow in the AM.

## 2020-12-10 NOTE — H&P (Signed)
History and Physical   Richard Gillespie:[id number] DOB: 08-02-1951 DOA: 12/10/2020  PCP: Gracelyn Nurse, MD  Patient coming from: home  I have personally briefly reviewed patient's old medical records in [facility] EMR.  Chief Concern: Poor p.o. intake  HPI: Richard Gillespie is a 70 y.o. male with medical history significant for hypertension, seizures, severe intellectual disability, history of small bowel obstructions status post laparotomy and lysis of adhesion, presented to the emergency department for chief concerns of poor p.o. intake.  HPI obtained from caregiver as patient has learning disability.  Caregiver brought patient in for chief concerns of no PO intake since 12/09/20. Caregiver endorses loose/watery bowel movements in bed several times for the last two nights.   Caregiver denies fever, nausea, vomiting.   His brother reports that the last time he had a breakthrough seizure was several years ago.  ROS: Unable to complete due to patient has known learning disability  ED Course: Discussed with ED provider, patient requiring hospitalization due to small bowel obstruction.  Vitals in the emergency department were stable.  Labs unremarkable.  Assessment/Plan  Active Problems:   Ileus (HCC)   Ileus/small bowel obstruction-suspect secondary to adhesions due to history of multiple intra-abdominal surgeries - Abdominal x-ray showed findings consistent with small bowel obstruction similar to prior radiographs per radiology read - ED provider discussed patient case with general surgery, Dr. Lady Gary who reviewed images x-rays for comparison, he recommended medical admission, conservative measures - If worsening abdominal pain and or develops vomiting, NG tube would be warranted -N.p.o.  Chronic constipation-resumed home lactulose 10 mg p.o. as needed twice daily, MiraLAX, mag citrate as needed  BPH-Flomax resumed  History of seizures-resumed home carbamazepine 200 mg p.o.  twice daily, Lamictal 150 mg p.o. twice daily, Keppra 500 mg p.o. twice daily  Poor p.o. intake- LR IVF@125  mm/h  UA ordered  A.m. labs  As needed medications: Acetaminophen, ondansetron  Chart reviewed.   Hospitalization from 08/03/2019 to 08/17/2019 patient had small bowel obstruction.  Small bowel obstruction did not resolve with conservative management.  Patient needed exploratory laparotomy and lysis of adhesions which were done.    DVT prophylaxis: Heparin Code Status: DNR Diet: NPO Family Communication: updated brother that patient is being hospitalized for SBO Disposition Plan: pending clinical course Consults called: general surgery Admission status: observation  Past Medical History:  Diagnosis Date  . Mental retardation   . Seizures (HCC)    Past Surgical History:  Procedure Laterality Date  . COLON SURGERY    . LAPAROTOMY N/A 08/05/2019   Procedure: EXPLORATORY LAPAROTOMY;  Surgeon: Carolan Shiver, MD;  Location: [facility] ORS;  Service: General;  Laterality: N/A;   Social History:  reports that he has never smoked. He has never used smokeless tobacco. He reports that he does not drink alcohol and does not use drugs.  Allergies  Allergen Reactions  . Zithromax [Azithromycin] Other (See Comments)    unknown   History reviewed. No pertinent family history. Family history: Family history reviewed and not pertinent  Prior to Admission medications   Medication Sig Start Date End Date Taking? Authorizing Provider  bisacodyl (DULCOLAX) 10 MG suppository Place 10 mg rectally as needed for moderate constipation.    [provider]  carbamazepine (TEGRETOL XR) 200 MG 12 hr tablet Take 1 tablet (200 mg total) by mouth 2 (two) times daily. 05/10/20   Alford Highland, MD  fluconazole (DIFLUCAN) 100 MG tablet Take 1 tablet (100 mg total) by mouth daily.  05/10/20   Alford Highland, MD  lactulose (CHRONULAC) 10 GM/15ML solution Take 10 g by mouth 2 (two) times daily  as needed for mild constipation.     [provider]  lamoTRIgine (LAMICTAL) 150 MG tablet Take 1 tablet (150 mg total) by mouth 2 (two) times daily. 05/10/20   Alford Highland, MD  levETIRAcetam (KEPPRA) 500 MG tablet Take 1 tablet (500 mg total) by mouth 2 (two) times daily. 05/10/20   Alford Highland, MD  loratadine (CLARITIN) 10 MG tablet Take 10 mg by mouth daily.    [provider]  magnesium citrate SOLN Take 1 Bottle by mouth daily as needed for severe constipation (at least 2 days of no BM).     [provider]  medroxyPROGESTERone (PROVERA) 10 MG tablet Take 10 mg by mouth daily.    [provider]  Multiple Vitamin (THEREMS) TABS Take 1 tablet by mouth daily.    [provider]  polyethylene glycol (MIRALAX / GLYCOLAX) 17 g packet Take 17 g by mouth daily. 05/10/20   Alford Highland, MD  potassium chloride (KLOR-CON) 20 MEQ packet Take 20 mEq by mouth daily for 5 days. 05/10/20 05/15/20  Alford Highland, MD  senna (SENOKOT) 8.6 MG TABS tablet Take 2 tablets by mouth 2 (two) times daily.     [provider]  tamsulosin (FLOMAX) 0.4 MG CAPS capsule Take 1 capsule (0.4 mg total) by mouth daily. 05/10/20   Alford Highland, MD  traZODone (DESYREL) 150 MG tablet Take 300 mg by mouth at bedtime.    [provider]   Physical Exam: Vitals:   12/10/20 1530 12/10/20 1630 12/10/20 1730 12/10/20 1930  BP: 129/61 122/66 116/65 (!) 106/51  Pulse: 76 74 64 60  Resp: 20 (!) 21 16   Temp:      TempSrc:      SpO2: 97% 99% 98% 96%   Constitutional: appears age-appropriate, NAD, calm, comfortable Eyes: PERRL, lids and conjunctivae normal ENMT: Mucous membranes are moist. Posterior pharynx clear of any exudate or lesions. Age-appropriate dentition. Hearing appropriate Neck: normal, supple, no masses, no thyromegaly Respiratory: clear to auscultation bilaterally, no wheezing, no crackles. Normal respiratory effort. No accessory muscle use.   Cardiovascular: Regular rate and rhythm, no murmurs / rubs / gallops. No extremity edema. 2+ pedal pulses. No carotid bruits.  Abdomen: no tenderness, no masses palpated, no hepatosplenomegaly. Bowel sounds positive.  Musculoskeletal: no clubbing / cyanosis. No joint deformity upper and lower extremities. Good ROM, no contractures, no atrophy. Normal muscle tone.  Skin: no rashes, lesions, ulcers. No induration Neurologic: Sensation intact. Strength 5/5 in all 4.  Psychiatric: Learning disability  EKG: independently reviewed, showing sinus rhythm, rate of 82, QTc 413, left bundle branch block, unchanged from previous EKG compared to 05/06/2020  Chest x-ray on Admission: I personally reviewed and I agree with radiologist reading as below.  DG Abdomen 1 View  Result Date: 12/10/2020 CLINICAL DATA:  Clinical signs of small-bowel obstruction. Poor p.o. intake. Diarrhea. EXAM: ABDOMEN - 1 VIEW COMPARISON:  08/05/2019. FINDINGS: There is dilated small bowel with significant gaseous distention of the stomach. No convincing colonic dilation. Soft tissues are poorly defined due to overlying bowel gas. No convincing renal or ureteral stone. Skeletal structures show no acute findings. IMPRESSION: 1. Significant dilation of small bowel consistent with a small-bowel obstruction. Findings are similar to the prior radiographs. Electronically Signed   By: Amie Portland M.D.   On: 12/10/2020 16:49   DG Chest Portable 1  View  Result Date: 12/10/2020 CLINICAL DATA:  Covid exposure, weakness. Care taker at bedside states that patient hasn't eaten in 2 days and has had diarrhea x 2 but this am BM was normal. EXAM: PORTABLE CHEST 1 VIEW COMPARISON:  08/09/2019. FINDINGS: Cardiac silhouette is normal in size, but shifted to the right, similar to prior studies. No mediastinal or hilar masses. Elevated left hemidiaphragm.  Stomach moderately distended with air. Lungs are clear.  No pleural effusion and no pneumothorax.  Skeletal structures are grossly intact. IMPRESSION: No acute cardiopulmonary disease. Electronically Signed   By: Amie Portland M.D.   On: 12/10/2020 15:34   Labs on Admission: I have personally reviewed following labs  CBC: Recent Labs  Lab 12/10/20 1320  WBC 10.2  NEUTROABS 7.5  HGB 14.1  HCT 42.2  MCV 87.9  PLT 215   Basic Metabolic Panel: Recent Labs  Lab 12/10/20 1320  NA 136  K 3.3*  CL 98  CO2 24  GLUCOSE 108*  BUN 19  CREATININE 1.06  CALCIUM 9.1   GFR: CrCl cannot be calculated (Unknown ideal weight.).  Liver Function Tests: Recent Labs  Lab 12/10/20 1320  AST 31  ALT 24  ALKPHOS 51  BILITOT 0.9  PROT 7.5  ALBUMIN 4.4   Recent Labs  Lab 12/10/20 1320  LIPASE 23   Urine analysis:    Component Value Date/Time   COLORURINE AMBER (A) 12/10/2020 1700   APPEARANCEUR HAZY (A) 12/10/2020 1700   LABSPEC 1.025 12/10/2020 1700   PHURINE 5.0 12/10/2020 1700   GLUCOSEU NEGATIVE 12/10/2020 1700   HGBUR NEGATIVE 12/10/2020 1700   BILIRUBINUR NEGATIVE 12/10/2020 1700   KETONESUR 5 (A) 12/10/2020 1700   PROTEINUR 100 (A) 12/10/2020 1700   NITRITE NEGATIVE 12/10/2020 1700   LEUKOCYTESUR NEGATIVE 12/10/2020 1700   Mahaila Tischer N Maysen Sudol D.O. [facility]  If 7PM-7AM, please contact overnight-coverage provider If 7AM-7PM, please contact day coverage provider [email or web address]  12/10/2020, 7:59 PM

## 2020-12-10 NOTE — ED Notes (Signed)
Pt will not allow RN to start iv at this time.

## 2020-12-10 NOTE — ED Provider Notes (Signed)
[facility] Emergency Department Provider Note ____________________________________________   Event Date/Time   First MD Initiated Contact with Patient 12/10/20 1455     (approximate)  I have reviewed the triage vital signs and the nursing notes.  HISTORY  Chief Complaint Eating Disorder and Diarrhea   HPI Richard Gillespie is a 70 y.o. malewho presents to the ED for evaluation of acute anorexia and diarrhea.  Chart review indicates intellectual disability and seizure disorder on 3 different antiepileptics.  Resides in a local group home. Patient presents to the ED with his primary caregiver from his group home.  Caregiver indicates that she has known the patient for greater than 1 year and cares for him regularly.  No changes to medications. Caregiver indicates that multiple other members of the group home have tested positive for COVID in the past week.  Caregiver brings the patient into the ED for evaluation of poor p.o. intake for 2 days and 2 episodes of watery diarrhea, 1 of which in his own bed.  Caregiver denies melena, hematochezia, emesis or complaints of abdominal pain at home.  Denies cough or shortness of breath.  No recent antibiotics and no significant history of UTIs.  Patient unable to provide any relevant history due to his baseline intellectual disability.   Past Medical History:  Diagnosis Date  . Mental retardation   . Seizures [facility])     Patient Active Problem List   Diagnosis Date Noted  . Hypomagnesemia   . Hypotension   . Thrush   . Acute urinary retention   . Small bowel obstruction (HCC) 08/03/2019  . Malnutrition of moderate degree 09/17/2018  . SBO (small bowel obstruction) (HCC) 09/12/2018  . Aspiration pneumonia (HCC) 09/12/2018  . Seizure disorder (HCC) 09/12/2018  . Sepsis (HCC) 09/12/2018  . E coli infection 01/15/2017  . Agitation 01/15/2017  . Diarrhea 01/15/2017  . Hypokalemia 01/15/2017  . Hyponatremia  01/15/2017  . Leukocytosis 01/15/2017  . Hyperglycemia 01/15/2017  . UTI (urinary tract infection) 01/12/2017    Past Surgical History:  Procedure Laterality Date  . COLON SURGERY    . LAPAROTOMY N/A 08/05/2019   Procedure: EXPLORATORY LAPAROTOMY;  Surgeon: Carolan Shiver, MD;  Location: [facility] ORS;  Service: General;  Laterality: N/A;    Prior to Admission medications   Medication Sig Start Date End Date Taking? Authorizing Provider  bisacodyl (DULCOLAX) 10 MG suppository Place 10 mg rectally as needed for moderate constipation.    [provider]  carbamazepine (TEGRETOL XR) 200 MG 12 hr tablet Take 1 tablet (200 mg total) by mouth 2 (two) times daily. 05/10/20   Alford Highland, MD  fluconazole (DIFLUCAN) 100 MG tablet Take 1 tablet (100 mg total) by mouth daily. 05/10/20   Alford Highland, MD  lactulose (CHRONULAC) 10 GM/15ML solution Take 10 g by mouth 2 (two) times daily as needed for mild constipation.     [provider]  lamoTRIgine (LAMICTAL) 150 MG tablet Take 1 tablet (150 mg total) by mouth 2 (two) times daily. 05/10/20   Alford Highland, MD  levETIRAcetam (KEPPRA) 500 MG tablet Take 1 tablet (500 mg total) by mouth 2 (two) times daily. 05/10/20   Alford Highland, MD  loratadine (CLARITIN) 10 MG tablet Take 10 mg by mouth daily.    [provider]  magnesium citrate SOLN Take 1 Bottle by mouth daily as needed for severe constipation (at least 2 days of no BM).     [provider]  medroxyPROGESTERone (PROVERA)  10 MG tablet Take 10 mg by mouth daily.    [provider]  Multiple Vitamin (THEREMS) TABS Take 1 tablet by mouth daily.    [provider]  polyethylene glycol (MIRALAX / GLYCOLAX) 17 g packet Take 17 g by mouth daily. 05/10/20   Alford Highland, MD  potassium chloride (KLOR-CON) 20 MEQ packet Take 20 mEq by mouth daily for 5 days. 05/10/20 05/15/20  Alford Highland, MD  senna (SENOKOT) 8.6 MG TABS tablet Take 2  tablets by mouth 2 (two) times daily.     [provider]  tamsulosin (FLOMAX) 0.4 MG CAPS capsule Take 1 capsule (0.4 mg total) by mouth daily. 05/10/20   Alford Highland, MD  traZODone (DESYREL) 150 MG tablet Take 300 mg by mouth at bedtime.    [provider]    Allergies Zithromax [azithromycin]  History reviewed. No pertinent family history.  Social History Social History   Tobacco Use  . Smoking status: Never Smoker  . Smokeless tobacco: Never Used  Substance Use Topics  . Alcohol use: No  . Drug use: Never    Review of Systems  Unable to be accurately assessed due to patient's intellectual disability. ____________________________________________   PHYSICAL EXAM:  VITAL SIGNS: Vitals:   12/10/20 1530 12/10/20 1630  BP: 129/61 122/66  Pulse: 76 74  Resp: 20 (!) 21  Temp:    SpO2: 97% 99%     Constitutional: Alert .  Lying on his right side under blankets.  Well-appearing.  Interacting at baseline, per the caregiver who is at the bedside. Eyes: Conjunctivae are normal. PERRL. EOMI. Head: Atraumatic. Nose: No congestion/rhinnorhea. Mouth/Throat: Mucous membranes are moist.  Oropharynx non-erythematous. Neck: No stridor. No cervical spine tenderness to palpation. Cardiovascular: Normal rate, regular rhythm. Grossly normal heart sounds.  Good peripheral circulation. Respiratory: Normal respiratory effort.  No retractions. Lungs CTAB. Gastrointestinal: Soft , nondistended, nontender to palpation. No CVA tenderness.  Soft and benign throughout without apparent discomfort. Musculoskeletal: No lower extremity tenderness nor edema.  No joint effusions. No signs of acute trauma. Neurologic:  Normal speech and language. No gross focal neurologic deficits are appreciated. No gait instability noted. Skin:  Skin is warm, dry and intact. No rash noted. Psychiatric: Mood and affect are normal. Speech and behavior are  normal.  ____________________________________________   LABS (all labs ordered are listed, but only abnormal results are displayed)  Labs Reviewed  COMPREHENSIVE METABOLIC PANEL - Abnormal; Notable for the following components:      Result Value   Potassium 3.3 (*)    Glucose, Bld 108 (*)    All other components within normal limits  URINALYSIS, COMPLETE (UACMP) WITH MICROSCOPIC - Abnormal; Notable for the following components:   Color, Urine AMBER (*)    APPearance HAZY (*)    Ketones, ur 5 (*)    Protein, ur 100 (*)    Bacteria, UA RARE (*)    All other components within normal limits  RESP PANEL BY RT-PCR (FLU A&B, COVID) ARPGX2  CBC WITH DIFFERENTIAL/PLATELET  LIPASE, BLOOD   ____________________________________________  12 Lead EKG  Sinus rhythm, rate of 82 bpm.  Normal axis.  Left bundle branch block.  Stigmata of LVH.  No evidence of acute ischemia per Sgarbossa criteria Identical to previous from 04/2020 ____________________________________________  RADIOLOGY  ED MD interpretation: 1 view CXR reviewed by me without evidence of acute cardiopulmonary pathology.  Official radiology report(s): DG Abdomen 1 View  Result Date: 12/10/2020 CLINICAL DATA:  Clinical signs of  small-bowel obstruction. Poor p.o. intake. Diarrhea. EXAM: ABDOMEN - 1 VIEW COMPARISON:  08/05/2019. FINDINGS: There is dilated small bowel with significant gaseous distention of the stomach. No convincing colonic dilation. Soft tissues are poorly defined due to overlying bowel gas. No convincing renal or ureteral stone. Skeletal structures show no acute findings. IMPRESSION: 1. Significant dilation of small bowel consistent with a small-bowel obstruction. Findings are similar to the prior radiographs. Electronically Signed   By: Amie Portland M.D.   On: 12/10/2020 16:49   DG Chest Portable 1 View  Result Date: 12/10/2020 CLINICAL DATA:  Covid exposure, weakness. Care taker at bedside states that patient  hasn't eaten in 2 days and has had diarrhea x 2 but this am BM was normal. EXAM: PORTABLE CHEST 1 VIEW COMPARISON:  08/09/2019. FINDINGS: Cardiac silhouette is normal in size, but shifted to the right, similar to prior studies. No mediastinal or hilar masses. Elevated left hemidiaphragm.  Stomach moderately distended with air. Lungs are clear.  No pleural effusion and no pneumothorax. Skeletal structures are grossly intact. IMPRESSION: No acute cardiopulmonary disease. Electronically Signed   By: Amie Portland M.D.   On: 12/10/2020 15:34    ____________________________________________   PROCEDURES and INTERVENTIONS  Procedure(s) performed (including Critical Care):  .1-3 Lead EKG Interpretation Performed by: Delton Prairie, MD Authorized by: Delton Prairie, MD     Interpretation: normal     ECG rate:  74   ECG rate assessment: normal     Rhythm: sinus rhythm     Ectopy: none     Conduction: normal   Comments:     LBBB    Medications - No data to display  ____________________________________________   MDM / ED COURSE   71 year old male with intellectual disability presents to the ED with poor p.o. intake and diarrhea, with radiographic evidence of ileus, requiring medical admission.  Normal vitals on room air.  Exam with a soft abdomen in a patient at his behavioral baseline, per the caregiver at the bedside.  No evidence of distress, peritoneal abdomen, trauma or neurovascular deficits.  Blood work without pathology.  CXR without infiltrates.  COVID swab returns negative.  Due to his history of SBO and ileus, KUB performed, and demonstrates features of ileus.  He is passing stool and his abdomen is benign, and therefore does not have an obstruction.  Spoke with general surgery who recommended conservative measures and medical admission, NG tube if he were to develop emesis.  We will admit to hospitalist medicine for further observation, work-up and management.   Clinical Course as of  12/10/20 1738  Sat Dec 10, 2020  1622 Reassessed.  Educated patient and caregiver of negative COVID test.  Patient has drank 3 cups of water without complication.  Reexamination was abdomen reveals continued benign abdomen.  We discussed other causes of poor p.o. intake, but relatively benign clinical picture.  We discussed performing KUB to ensure no radiographic evidence of SBO, despite being clinically unlikely [DS]  1722 X-ray noted. General surgery paged. [DS]  1728 I speak with Dr. Lady Gary, who reviews images and previous for comparisons, he recommends medical admit and conservative measures. If worsening abd pain or vomiting, NGT would be warranted.   [DS]  [id number] Educated caregiver and nurse on plan of care.  Caregiver reports concern that his urine was very dark and cloudy, awaiting UA results. Dr. Sedalia Muta, admitting hospitalist, is already in the ED taking admissions.  I inform her of this case. [DS]    Clinical  Course User Index [DS] Delton PrairieSmith, Raymie Giammarco, MD    ____________________________________________   FINAL CLINICAL IMPRESSION(S) / ED DIAGNOSES  Final diagnoses:  Ileus (HCC)  Diarrhea, unspecified type  Dehydration     ED Discharge Orders    None       Richard Gillespie   Note:  This document was prepared using Dragon voice recognition software and may include unintentional dictation errors.   Delton PrairieSmith, Hayzlee Mcsorley, MD 12/10/20 [phone]

## 2020-12-10 NOTE — ED Notes (Signed)
Richard clemmons, rn notified pt does not have an iv and pt is on way to floor.

## 2020-12-10 NOTE — ED Notes (Signed)
Caregiver at bedside. States pt hasn't eaten for 2 days and had 1 episode of diarrhea last night and night before.   Pt understands speech and cooperates. Hard to understand, caregiver understands him. Caregiver unsure if dementia or long-term cognitive impairment/developmental. Pt is at neurological baseline per caregiver.

## 2020-12-10 NOTE — ED Notes (Signed)
Report to brianna clemmons, rn awaiting bed to be cleaned.

## 2020-12-10 NOTE — ED Triage Notes (Signed)
Care taker at bedside states that patient hasn't eaten in 2 days and has had diarrhea x 2 but this am BM was normal. Caretaker states that he looks sickly. Caretaker states adult day care has had an outbreak of COVID x 2 but stated patient wasn't exposed.

## 2020-12-10 NOTE — ED Notes (Signed)
Labs and EKG completed. Patient stuck x 2 due to jerking of hand. Patient tolerated well.

## 2020-12-11 ENCOUNTER — Inpatient Hospital Stay: Payer: Medicare Other

## 2020-12-11 ENCOUNTER — Inpatient Hospital Stay: Payer: Self-pay

## 2020-12-11 DIAGNOSIS — Z9289 Personal history of other medical treatment: Secondary | ICD-10-CM

## 2020-12-11 DIAGNOSIS — G40909 Epilepsy, unspecified, not intractable, without status epilepticus: Secondary | ICD-10-CM | POA: Diagnosis not present

## 2020-12-11 DIAGNOSIS — E860 Dehydration: Secondary | ICD-10-CM | POA: Diagnosis present

## 2020-12-11 DIAGNOSIS — F72 Severe intellectual disabilities: Secondary | ICD-10-CM

## 2020-12-11 DIAGNOSIS — R630 Anorexia: Secondary | ICD-10-CM | POA: Diagnosis not present

## 2020-12-11 DIAGNOSIS — R933 Abnormal findings on diagnostic imaging of other parts of digestive tract: Secondary | ICD-10-CM

## 2020-12-11 DIAGNOSIS — R197 Diarrhea, unspecified: Secondary | ICD-10-CM

## 2020-12-11 DIAGNOSIS — Z881 Allergy status to other antibiotic agents status: Secondary | ICD-10-CM | POA: Diagnosis not present

## 2020-12-11 DIAGNOSIS — K30 Functional dyspepsia: Secondary | ICD-10-CM | POA: Diagnosis present

## 2020-12-11 DIAGNOSIS — Z66 Do not resuscitate: Secondary | ICD-10-CM | POA: Diagnosis present

## 2020-12-11 DIAGNOSIS — F79 Unspecified intellectual disabilities: Secondary | ICD-10-CM | POA: Diagnosis not present

## 2020-12-11 DIAGNOSIS — N400 Enlarged prostate without lower urinary tract symptoms: Secondary | ICD-10-CM | POA: Diagnosis present

## 2020-12-11 DIAGNOSIS — Z79899 Other long term (current) drug therapy: Secondary | ICD-10-CM | POA: Diagnosis not present

## 2020-12-11 DIAGNOSIS — E876 Hypokalemia: Secondary | ICD-10-CM | POA: Diagnosis present

## 2020-12-11 DIAGNOSIS — K567 Ileus, unspecified: Secondary | ICD-10-CM | POA: Diagnosis present

## 2020-12-11 DIAGNOSIS — R451 Restlessness and agitation: Secondary | ICD-10-CM | POA: Diagnosis present

## 2020-12-11 DIAGNOSIS — Z20822 Contact with and (suspected) exposure to covid-19: Secondary | ICD-10-CM | POA: Diagnosis present

## 2020-12-11 LAB — CBC
HCT: 37.1 % — ABNORMAL LOW (ref 39.0–52.0)
Hemoglobin: 12.7 g/dL — ABNORMAL LOW (ref 13.0–17.0)
MCH: 29.7 pg (ref 26.0–34.0)
MCHC: 34.2 g/dL (ref 30.0–36.0)
MCV: 86.7 fL (ref 80.0–100.0)
Platelets: 173 10*3/uL (ref 150–400)
RBC: 4.28 MIL/uL (ref 4.22–5.81)
RDW: 12.4 % (ref 11.5–15.5)
WBC: 6.6 10*3/uL (ref 4.0–10.5)
nRBC: 0 % (ref 0.0–0.2)

## 2020-12-11 LAB — BASIC METABOLIC PANEL
Anion gap: 11 (ref 5–15)
BUN: 14 mg/dL (ref 8–23)
CO2: 26 mmol/L (ref 22–32)
Calcium: 8.5 mg/dL — ABNORMAL LOW (ref 8.9–10.3)
Chloride: 99 mmol/L (ref 98–111)
Creatinine, Ser: 0.9 mg/dL (ref 0.61–1.24)
GFR, Estimated: >60 mL/min (ref >60)
Glucose, Bld: 84 mg/dL (ref 70–99)
Potassium: 3 mmol/L — ABNORMAL LOW (ref 3.5–5.1)
Sodium: 136 mmol/L (ref 135–145)

## 2020-12-11 LAB — HIV ANTIBODY (ROUTINE TESTING W REFLEX): HIV Screen 4th Generation wRfx: NONREACTIVE

## 2020-12-11 MED ORDER — POTASSIUM CHLORIDE 10 MEQ/100ML IV SOLN
10.0000 meq | INTRAVENOUS | Status: AC
  Administered 2020-12-11: 10 meq via INTRAVENOUS
  Filled 2020-12-11 (×5): qty 100

## 2020-12-11 MED ORDER — LORAZEPAM 2 MG/ML IJ SOLN
2.0000 mg | Freq: Once | INTRAMUSCULAR | Status: AC
  Administered 2020-12-12: 2 mg via INTRAMUSCULAR
  Filled 2020-12-11: qty 1

## 2020-12-11 MED ORDER — POTASSIUM CHLORIDE 20 MEQ PO PACK
80.0000 meq | PACK | Freq: Once | ORAL | Status: AC
  Administered 2020-12-11: 80 meq via ORAL
  Filled 2020-12-11: qty 4

## 2020-12-11 MED ORDER — LORAZEPAM 2 MG PO TABS: 2.0000 mg | ORAL_TABLET | Freq: Once | ORAL | Status: AC

## 2020-12-11 MED ORDER — MAGNESIUM SULFATE 2 GM/50ML IV SOLN
2.0000 g | Freq: Once | INTRAVENOUS | Status: AC
  Administered 2020-12-11: 2 g via INTRAVENOUS
  Filled 2020-12-11: qty 50

## 2020-12-11 NOTE — Progress Notes (Signed)
At the beginning of the shift patient was moving around in the bed changing positions with only few intermittent moments of laying still, patient was found to play with his IV tubing and IV despite multiple attempts to redirect the patient and distract him from these items.  Patient did manage to pull out his IV at which time his first bag of Potassium was running.  IV team order was placed, as the patient is a very difficult stick with minimal access, IV team nurse attempted to place three times to place an IV with the use of ultrasound, but even after being accessed successfully the catheters could not be advanced.  The patient was still able to move constantly even with the assistance of staff to help calm the patient and keep his arm held in position for IV access.  Dr. Margo Aye notified and placed orders for a PICC line to be placed with the use of sedation to assist the patient in tolerating the procedure and help minimize movement.  This nurse was contacted by mobile [facility] IV team who expressed for this patient a midline or PICC may not be the best and safest option and suggested central line placement instead - Dr. Margo Aye updated on their suggestion.  Dr. Margo Aye placed po Potassium orders, which the patient tolerated well.  Patient was also placed on a diet order, by Dr. Margo Aye, as IV fluids can not be administered at this time.  Will continue to monitor patient and endorse to oncoming shift.

## 2020-12-11 NOTE — Progress Notes (Signed)
A consult was placed to the IV Therapist for new iv access; (iv had been started during the night by IV nurse);  Pt moves CONSTANTLY, despite being held by 2 staff members;  Attempted 3x to restart the iv; accessed the vein each time,  but was not able to thread the catheters; very poor vasculature even with ultrasound;  Considerable amount of time spent with this pt attempting access;  RN has sent secure chat to MD;  Pt remains without iv access at this time.  Also concerned that pt wraps iv tubing around himself due to his inability to lie still.

## 2020-12-11 NOTE — Plan of Care (Signed)
Continuing with plan of care. 

## 2020-12-11 NOTE — Progress Notes (Signed)
PROGRESS NOTE  Richard Gillespie MMN:[id number] DOB: September 10, 1951 DOA: 12/10/2020 PCP: Gracelyn Nurse, MD  HPI/Recap of past 24 hours: Richard Gillespie is a 70 y.o. male with medical history significant for hypertension, seizures, severe intellectual disability, history of small bowel obstructions status post laparotomy and lysis of adhesion, presented to the emergency department for chief concerns of poor p.o. intake.  HPI obtained from caregiver as patient has learning disability.  Caregiver brought patient in for chief concerns of no PO intake since 12/09/20. Caregiver endorses loose/watery bowel movements in bed several times for the last two nights.   Caregiver denies fever, nausea, vomiting.   His brother reports that the last time he had a breakthrough seizure was several years ago.  ROS: Unable to complete due to patient has known learning disability  ED Course: Discussed with ED provider, patient requiring hospitalization due to small bowel obstruction.  Vitals in the emergency department were stable.  Labs unremarkable.  12/11/20: Patient was seen and examined at his bedside.  He appears uncomfortable in the bed but he is not able to verbalize.  He appears to have some abdominal discomfort with tenderness on palpation diffusely.   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 History of small bowel obstruction, previously treated conservatively. General surgery has been consulted and following. No NG tube placed as the patient has had no nausea or vomiting. Closely monitor electrolytes, goal potassium greater than 4.0, magnesium greater than 2.0. IV fluid hydration, pain control, chemical DVT prophylaxis. Mobilize if able.  Hypokalemia Serum potassium 3.0 Repleted intravenously Added serum magnesium  2 g x 1 dose Repeat BMP in the morning and magnesium level.  Seizure disorder Continue home AEDs Continue seizure precautions    Code Status: DNR  Family Communication: None at bedside.  Disposition Plan: Likely will return to home with home health services.   Consultants:  General surgery  Procedures:  None.  Antimicrobials:  None.  DVT prophylaxis: Subcu heparin 3 times daily  Status is: Inpatient    Dispo:  Patient From: Home  Planned Disposition: Home with Health Care S[facility]  Expected discharge date: 12/13/2020  Medically stable for discharge: No, ongoing management of SBO.         Objective: Vitals:   12/10/20 1930 12/10/20 2000 12/10/20 2255 12/11/20 0501  BP: (!) 106/51 (!) 115/42 125/75 (!) 96/50  Pulse: 60 (!) 118 100 91  Resp:   20 20  Temp:   97.8 F (36.6 C) 97.8 F (36.6 C)  TempSrc:   Oral Oral  SpO2: 96% 93% 96% 91%    Intake/Output Summary (Last 24 hours) at 12/11/2020 1035 Last data filed at 12/10/2020 1705 Gross per 24 hour  Intake 480 ml  Output 225 ml  Net 255 ml   There were no vitals filed for this visit.  Exam:  . General: 70 y.o. year-old male very frail appearing.  Chronically ill appearing.  Alert and nonverbal. . Cardiovascular: Regular rate and rhythm with no rubs or gallops.  No thyromegaly or JVD noted.   Marland Kitchen Respiratory: Clear to auscultation with no wheezes or rales.  Poor inspiratory effort. . Abdomen: Soft diffusely tender.  Nondistended.  Hypoactive bowel sounds.  . . Musculoskeletal: No lower extremity edema bilaterally.   Marland Kitchen Psychiatry: Mood is appropriate for condition and setting   Data Reviewed: CBC: Recent  Labs  Lab 12/10/20 1320 12/11/20 0427  WBC 10.2 6.6  NEUTROABS 7.5  --   HGB 14.1 12.7*  HCT 42.2 37.1*  MCV 87.9 86.7  PLT 215 173   Basic Metabolic Panel: Recent Labs  Lab 12/10/20 1320 12/11/20 0427  NA 136 136  K 3.3* 3.0*  CL 98 99  CO2 24 26  GLUCOSE 108* 84  BUN 19 14   CREATININE 1.06 0.90  CALCIUM 9.1 8.5*   GFR: CrCl cannot be calculated (Unknown ideal weight.). Liver Function Tests: Recent Labs  Lab 12/10/20 1320  AST 31  ALT 24  ALKPHOS 51  BILITOT 0.9  PROT 7.5  ALBUMIN 4.4   Recent Labs  Lab 12/10/20 1320  LIPASE 23   No results for input(s): AMMONIA in the last 168 hours. Coagulation Profile: No results for input(s): INR, PROTIME in the last 168 hours. Cardiac Enzymes: No results for input(s): CKTOTAL, CKMB, CKMBINDEX, TROPONINI in the last 168 hours. BNP (last 3 results) No results for input(s): PROBNP in the last 8760 hours. HbA1C: No results for input(s): HGBA1C in the last 72 hours. CBG: No results for input(s): GLUCAP in the last 168 hours. Lipid Profile: No results for input(s): CHOL, HDL, LDLCALC, TRIG, CHOLHDL, LDLDIRECT in the last 72 hours. Thyroid Function Tests: No results for input(s): TSH, T4TOTAL, FREET4, T3FREE, THYROIDAB in the last 72 hours. Anemia Panel: No results for input(s): VITAMINB12, FOLATE, FERRITIN, TIBC, IRON, RETICCTPCT in the last 72 hours. Urine analysis:    Component Value Date/Time   COLORURINE AMBER (A) 12/10/2020 1700   APPEARANCEUR HAZY (A) 12/10/2020 1700   LABSPEC 1.025 12/10/2020 1700   PHURINE 5.0 12/10/2020 1700   GLUCOSEU NEGATIVE 12/10/2020 1700   HGBUR NEGATIVE 12/10/2020 1700   BILIRUBINUR NEGATIVE 12/10/2020 1700   KETONESUR 5 (A) 12/10/2020 1700   PROTEINUR 100 (A) 12/10/2020 1700   NITRITE NEGATIVE 12/10/2020 1700   LEUKOCYTESUR NEGATIVE 12/10/2020 1700   Sepsis Labs: @LABRCNTIP (procalcitonin:4,lacticidven:4)  ) Recent Results (from the past 240 hour(s))  Resp Panel by RT-PCR (Flu A&B, Covid) Nasopharyngeal Swab     Status: None   Collection Time: 12/10/20  3:04 PM   Specimen: Nasopharyngeal Swab; Nasopharyngeal(NP) swabs in vial transport medium  Result Value Ref Range Status   SARS Coronavirus 2 by RT PCR NEGATIVE NEGATIVE Final    Comment: (NOTE) SARS-CoV-2  target nucleic acids are NOT DETECTED.  The SARS-CoV-2 RNA is generally detectable in upper respiratory specimens during the acute phase of infection. The lowest concentration of SARS-CoV-2 viral copies this assay can detect is 138 copies/mL. A negative result does not preclude SARS-Cov-2 infection and should not be used as the sole basis for treatment or other patient management decisions. A negative result may occur with  improper specimen collection/handling, submission of specimen other than nasopharyngeal swab, presence of viral mutation(s) within the areas targeted by this assay, and inadequate number of viral copies(<138 copies/mL). A negative result must be combined with clinical observations, patient history, and epidemiological information. The expected result is Negative.  Fact Sheet for Patients:  12/12/20  Fact Sheet for Healthcare Providers:  BloggerCourse.com  This test is no t yet approved or cleared by the [place] FDA and  has been authorized for detection and/or diagnosis of SARS-CoV-2 by FDA under an Emergency Use Authorization (EUA). This EUA will remain  in effect (meaning this test can be used) for the duration of the COVID-19 declaration under Section 564(b)(1) of the Act, 21 U.S.C.section 360bbb-3(b)(1), unless the  authorization is terminated  or revoked sooner.       Influenza A by PCR NEGATIVE NEGATIVE Final   Influenza B by PCR NEGATIVE NEGATIVE Final    Comment: (NOTE) The Xpert Xpress SARS-CoV-2/FLU/RSV plus assay is intended as an aid in the diagnosis of influenza from Nasopharyngeal swab specimens and should not be used as a sole basis for treatment. Nasal washings and aspirates are unacceptable for Xpert Xpress SARS-CoV-2/FLU/RSV testing.  Fact Sheet for Patients: BloggerCourse.com  Fact Sheet for Healthcare  Providers: SeriousBroker.it  This test is not yet approved or cleared by the [place] FDA and has been authorized for detection and/or diagnosis of SARS-CoV-2 by FDA under an Emergency Use Authorization (EUA). This EUA will remain in effect (meaning this test can be used) for the duration of the COVID-19 declaration under Section 564(b)(1) of the Act, 21 U.S.C. section 360bbb-3(b)(1), unless the authorization is terminated or revoked.  Performed at [facility], [address], [place], [place] [address]       Studies: DG Abdomen 1 View  Result Date: 12/10/2020 CLINICAL DATA:  Clinical signs of small-bowel obstruction. Poor p.o. intake. Diarrhea. EXAM: ABDOMEN - 1 VIEW COMPARISON:  08/05/2019. FINDINGS: There is dilated small bowel with significant gaseous distention of the stomach. No convincing colonic dilation. Soft tissues are poorly defined due to overlying bowel gas. No convincing renal or ureteral stone. Skeletal structures show no acute findings. IMPRESSION: 1. Significant dilation of small bowel consistent with a small-bowel obstruction. Findings are similar to the prior radiographs. Electronically Signed   By: Amie Portland M.D.   On: 12/10/2020 16:49   DG Chest Portable 1 View  Result Date: 12/10/2020 CLINICAL DATA:  Covid exposure, weakness. Care taker at bedside states that patient hasn't eaten in 2 days and has had diarrhea x 2 but this am BM was normal. EXAM: PORTABLE CHEST 1 VIEW COMPARISON:  08/09/2019. FINDINGS: Cardiac silhouette is normal in size, but shifted to the right, similar to prior studies. No mediastinal or hilar masses. Elevated left hemidiaphragm.  Stomach moderately distended with air. Lungs are clear.  No pleural effusion and no pneumothorax. Skeletal structures are grossly intact. IMPRESSION: No acute cardiopulmonary disease. Electronically Signed   By: Amie Portland M.D.   On: 12/10/2020 15:34    Scheduled Meds: .  carbamazepine  200 mg Oral BID  . heparin  5,000 Units Subcutaneous Q8H  . lamoTRIgine  150 mg Oral BID  . levETIRAcetam  500 mg Oral BID  . medroxyPROGESTERone  10 mg Oral Daily  . polyethylene glycol  17 g Oral Daily  . tamsulosin  0.4 mg Oral Daily  . traZODone  300 mg Oral QHS    Continuous Infusions: . lactated ringers 125 mL/hr at 12/11/20 0505  . potassium chloride 10 mEq (12/11/20 0942)     LOS: 0 days     Darlin Drop, MD [facility] Pager [phone]  If 7PM-7AM, please contact night-coverage [email or web address] Password [facility] 12/11/2020, 10:35 AM

## 2020-12-11 NOTE — Consult Note (Signed)
Reason for Consult:?SBO Referring Physician: Delton Prairie, MD (emergency medicine)  Richard Gillespie is an 70 y.o. male.  HPI: He was brought to the emergency department by his caregiver for anorexia and diarrhea.  He is known to the surgical service for a previous admission in June 2021 for partial small bowel obstruction that resolved without surgical intervention.  He was also operated on by Dr. Maia Plan in September 2020 for bowel obstruction and underwent extensive adhesiolysis at that time.  History is obtained entirely from the electronic medical record, as the patient is unable to provide it secondary to his severe mental retardation.  No caregiver is available at bedside.  Past Medical History:  Diagnosis Date  . Mental retardation   . Seizures (HCC)     Past Surgical History:  Procedure Laterality Date  . COLON SURGERY    . LAPAROTOMY N/A 08/05/2019   Procedure: EXPLORATORY LAPAROTOMY;  Surgeon: Carolan Shiver, MD;  Location: [facility] ORS;  Service: General;  Laterality: N/A;    History reviewed. No pertinent family history.  Social History:  reports that he has never smoked. He has never used smokeless tobacco. He reports that he does not drink alcohol and does not use drugs.  Allergies:  Allergies  Allergen Reactions  . Zithromax [Azithromycin] Other (See Comments)    unknown    Medications: I have reviewed the patient's current medications.  Results for orders placed or performed during the hospital encounter of 12/10/20 (from the past 48 hour(s))  CBC with Differential     Status: None   Collection Time: 12/10/20  1:20 PM  Result Value Ref Range   WBC 10.2 4.0 - 10.5 K/uL   RBC 4.80 4.22 - 5.81 MIL/uL   Hemoglobin 14.1 13.0 - 17.0 g/dL   HCT 16.1 09.6 - 04.5 %   MCV 87.9 80.0 - 100.0 fL   MCH 29.4 26.0 - 34.0 pg   MCHC 33.4 30.0 - 36.0 g/dL   RDW 40.9 81.1 - 91.4 %   Platelets 215 150 - 400 K/uL   nRBC 0.0 0.0 - 0.2 %   Neutrophils Relative % 75 %   Neutro Abs  7.5 1.7 - 7.7 K/uL   Lymphocytes Relative 15 %   Lymphs Abs 1.6 0.7 - 4.0 K/uL   Monocytes Relative 9 %   Monocytes Absolute 1.0 0.1 - 1.0 K/uL   Eosinophils Relative 1 %   Eosinophils Absolute 0.1 0.0 - 0.5 K/uL   Basophils Relative 0 %   Basophils Absolute 0.0 0.0 - 0.1 K/uL   Immature Granulocytes 0 %   Abs Immature Granulocytes 0.02 0.00 - 0.07 K/uL    Comment: Performed at [facility], [address] [place], [place] [address]  Comprehensive metabolic panel     Status: Abnormal   Collection Time: 12/10/20  1:20 PM  Result Value Ref Range   Sodium 136 135 - 145 mmol/L   Potassium 3.3 (L) 3.5 - 5.1 mmol/L   Chloride 98 98 - 111 mmol/L   CO2 24 22 - 32 mmol/L   Glucose, Bld 108 (H) 70 - 99 mg/dL    Comment: Glucose reference range applies only to samples taken after fasting for at least 8 hours.   BUN 19 8 - 23 mg/dL   Creatinine, Ser 6.21 0.61 - 1.24 mg/dL   Calcium 9.1 8.9 - 30.8 mg/dL   Total Protein 7.5 6.5 - 8.1 g/dL   Albumin 4.4 3.5 - 5.0 g/dL   AST 31 15 -  41 U/L   ALT 24 0 - 44 U/L   Alkaline Phosphatase 51 38 - 126 U/L   Total Bilirubin 0.9 0.3 - 1.2 mg/dL   GFR, Estimated >16>60 >10>60 mL/min    Comment: (NOTE) Calculated using the CKD-EPI Creatinine Equation (2021)    Anion gap 14 5 - 15    Comment: Performed at [facility], [address] [place], [place] [address]  Lipase, blood     Status: None   Collection Time: 12/10/20  1:20 PM  Result Value Ref Range   Lipase 23 11 - 51 U/L    Comment: Performed at [facility], [address] [place], [place] [address]  Resp Panel by RT-PCR (Flu A&B, Covid) Nasopharyngeal Swab     Status: None   Collection Time: 12/10/20  3:04 PM   Specimen: Nasopharyngeal Swab; Nasopharyngeal(NP) swabs in vial transport medium  Result Value Ref Range   SARS Coronavirus 2 by RT PCR NEGATIVE NEGATIVE    Comment: (NOTE) SARS-CoV-2 target nucleic acids are NOT DETECTED.  The SARS-CoV-2 RNA is generally  detectable in upper respiratory specimens during the acute phase of infection. The lowest concentration of SARS-CoV-2 viral copies this assay can detect is 138 copies/mL. A negative result does not preclude SARS-Cov-2 infection and should not be used as the sole basis for treatment or other patient management decisions. A negative result may occur with  improper specimen collection/handling, submission of specimen other than nasopharyngeal swab, presence of viral mutation(s) within the areas targeted by this assay, and inadequate number of viral copies(<138 copies/mL). A negative result must be combined with clinical observations, patient history, and epidemiological information. The expected result is Negative.  Fact Sheet for Patients:  BloggerCourse.comhttps://www.fda.gov/media/152166/download  Fact Sheet for Healthcare Providers:  SeriousBroker.ithttps://www.fda.gov/media/152162/download  This test is no t yet approved or cleared by the [place] FDA and  has been authorized for detection and/or diagnosis of SARS-CoV-2 by FDA under an Emergency Use Authorization (EUA). This EUA will remain  in effect (meaning this test can be used) for the duration of the COVID-19 declaration under Section 564(b)(1) of the Act, 21 U.S.C.section 360bbb-3(b)(1), unless the authorization is terminated  or revoked sooner.       Influenza A by PCR NEGATIVE NEGATIVE   Influenza B by PCR NEGATIVE NEGATIVE    Comment: (NOTE) The Xpert Xpress SARS-CoV-2/FLU/RSV plus assay is intended as an aid in the diagnosis of influenza from Nasopharyngeal swab specimens and should not be used as a sole basis for treatment. Nasal washings and aspirates are unacceptable for Xpert Xpress SARS-CoV-2/FLU/RSV testing.  Fact Sheet for Patients: BloggerCourse.comhttps://www.fda.gov/media/152166/download  Fact Sheet for Healthcare Providers: SeriousBroker.ithttps://www.fda.gov/media/152162/download  This test is not yet approved or cleared by the [place] FDA and has  been authorized for detection and/or diagnosis of SARS-CoV-2 by FDA under an Emergency Use Authorization (EUA). This EUA will remain in effect (meaning this test can be used) for the duration of the COVID-19 declaration under Section 564(b)(1) of the Act, 21 U.S.C. section 360bbb-3(b)(1), unless the authorization is terminated or revoked.  Performed at [facility], [address] [place], [place] [address]   Urinalysis, Complete w Microscopic Urine, Clean Catch     Status: Abnormal   Collection Time: 12/10/20  5:00 PM  Result Value Ref Range   Color, Urine AMBER (A) YELLOW    Comment: BIOCHEMICALS MAY BE AFFECTED BY COLOR   APPearance HAZY (A) CLEAR   Specific Gravity, Urine 1.025 1.005 - 1.030  pH 5.0 5.0 - 8.0   Glucose, UA NEGATIVE NEGATIVE mg/dL   Hgb urine dipstick NEGATIVE NEGATIVE   Bilirubin Urine NEGATIVE NEGATIVE   Ketones, ur 5 (A) NEGATIVE mg/dL   Protein, ur 660 (A) NEGATIVE mg/dL   Nitrite NEGATIVE NEGATIVE   Leukocytes,Ua NEGATIVE NEGATIVE   RBC / HPF 0-5 0 - 5 RBC/hpf   WBC, UA 6-10 0 - 5 WBC/hpf   Bacteria, UA RARE (A) NONE SEEN   Squamous Epithelial / LPF 0-5 0 - 5   Mucus PRESENT    Hyaline Casts, UA PRESENT     Comment: Performed at [facility], [address] [place], [place] [address]  HIV Antibody (routine testing w rflx)     Status: None   Collection Time: 12/11/20  4:27 AM  Result Value Ref Range   HIV Screen 4th Generation wRfx Non Reactive Non Reactive    Comment: Performed at [facility], [address] [place], [place] [address]  CBC     Status: Abnormal   Collection Time: 12/11/20  4:27 AM  Result Value Ref Range   WBC 6.6 4.0 - 10.5 K/uL   RBC 4.28 4.22 - 5.81 MIL/uL   Hemoglobin 12.7 (L) 13.0 - 17.0 g/dL   HCT 23.5 (L) 57.3 - 22.0 %   MCV 86.7 80.0 - 100.0 fL   MCH 29.7 26.0 - 34.0 pg   MCHC 34.2 30.0 - 36.0 g/dL   RDW 25.4 27.0 - 62.3 %   Platelets 173 150 - 400 K/uL   nRBC 0.0 0.0 - 0.2 %    Comment:  Performed at [facility], [address] [place], [place] [address]  Basic metabolic panel     Status: Abnormal   Collection Time: 12/11/20  4:27 AM  Result Value Ref Range   Sodium 136 135 - 145 mmol/L   Potassium 3.0 (L) 3.5 - 5.1 mmol/L   Chloride 99 98 - 111 mmol/L   CO2 26 22 - 32 mmol/L   Glucose, Bld 84 70 - 99 mg/dL    Comment: Glucose reference range applies only to samples taken after fasting for at least 8 hours.   BUN 14 8 - 23 mg/dL   Creatinine, Ser 1.51 0.61 - 1.24 mg/dL   Calcium 8.5 (L) 8.9 - 10.3 mg/dL   GFR, Estimated >76 >16 mL/min    Comment: (NOTE) Calculated using the CKD-EPI Creatinine Equation (2021)    Anion gap 11 5 - 15    Comment: Performed at [facility], [address] [place], [place] [address]    DG Abdomen 1 View  Result Date: 12/10/2020 CLINICAL DATA:  Clinical signs of small-bowel obstruction. Poor p.o. intake. Diarrhea. EXAM: ABDOMEN - 1 VIEW COMPARISON:  08/05/2019. FINDINGS: There is dilated small bowel with significant gaseous distention of the stomach. No convincing colonic dilation. Soft tissues are poorly defined due to overlying bowel gas. No convincing renal or ureteral stone. Skeletal structures show no acute findings. IMPRESSION: 1. Significant dilation of small bowel consistent with a small-bowel obstruction. Findings are similar to the prior radiographs. Electronically Signed   By: Amie Portland M.D.   On: 12/10/2020 16:49   DG Chest Portable 1 View  Result Date: 12/10/2020 CLINICAL DATA:  Covid exposure, weakness. Care taker at bedside states that patient hasn't eaten in 2 days and has had diarrhea x 2 but this am BM was normal. EXAM: PORTABLE CHEST 1 VIEW COMPARISON:  08/09/2019. FINDINGS: Cardiac silhouette is  normal in size, but shifted to the right, similar to prior studies. No mediastinal or hilar masses. Elevated left hemidiaphragm.  Stomach moderately distended with air. Lungs are clear.  No pleural effusion  and no pneumothorax. Skeletal structures are grossly intact. IMPRESSION: No acute cardiopulmonary disease. Electronically Signed   By: Amie Portland M.D.   On: 12/10/2020 15:34   DG Abd Portable 1V  Result Date: 12/11/2020 CLINICAL DATA:  Possible small bowel obstruction. EXAM: PORTABLE ABDOMEN - 1 VIEW COMPARISON:  12/10/2020 and CT 12/07/2019 FINDINGS: Bowel gas pattern demonstrates air throughout the colon. Again noted is a single dilated small bowel loop over the left mid to lower abdomen measuring 5.2 cm in diameter. No definite free peritoneal air. Remainder of the exam is unchanged. IMPRESSION: Persistent single dilated small bowel loop over the left mid to lower abdomen which may be due to early/partial small bowel obstruction. Electronically Signed   By: Elberta Fortis M.D.   On: 12/11/2020 14:31   [place] EKG SITE RITE  Result Date: 12/11/2020 If Site Rite image not attached, placement could not be confirmed due to current cardiac rhythm.   Review of Systems  Unable to perform ROS: Other  Mental retardation  Blood pressure 124/68, pulse 67, temperature 98 F (36.7 C), temperature source Oral, resp. rate 20, SpO2 100 %. Physical Exam Constitutional:      General: He is not in acute distress. HENT:     Head: Normocephalic and atraumatic.     Mouth/Throat:     Mouth: Mucous membranes are moist.  Eyes:     General:        Right eye: No discharge.        Left eye: No discharge.  Cardiovascular:     Rate and Rhythm: Normal rate and regular rhythm.  Pulmonary:     Effort: Pulmonary effort is normal. No respiratory distress.  Abdominal:     General: Abdomen is flat.     Palpations: Abdomen is soft.     Comments: Stool is present in his adult briefs.  Neurological:     Mental Status: He is alert. Mental status is at baseline.  Psychiatric:        Behavior: Behavior normal.     Assessment/Plan: This is a 70 year old man with significant developmental delay who was brought to the  emergency department by his caregiver due to anorexia and diarrhea.  Although I am not certain as to how well I can trust his responses, he states that he is not nauseated and has not vomited.  No emesis recorded in his chart and he did consume several glasses of water in the emergency department last night without any nausea or vomiting noted at that time.  He is stooling as evidenced by the condition of his brief.  Looking back through his chart over several years, his abdominal plain films always show the same pattern, even when not at the hospital for possible bowel-related issues.  Gas distended colon and stomach with left hemidiaphragm elevation has been reported on his plain films since 2001.  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  He may have gastroenteritis, but there is no report of nausea or vomiting.  He does have a prior history of bowel obstruction, treated with adhesiolysis, but at this time, I think nasoenteric decompression is not necessary.  Dr. Maia Plan is very familiar with his case and will be taking over his care tomorrow. Duanne Guess 12/11/2020,  3:24 PM

## 2020-12-12 ENCOUNTER — Encounter
Admission: EM | Disposition: A | Discharge status: Home / Self Care | Payer: Self-pay | Source: Home / Self Care | Attending: Internal Medicine

## 2020-12-12 DIAGNOSIS — K567 Ileus, unspecified: Secondary | ICD-10-CM | POA: Diagnosis not present

## 2020-12-12 LAB — BASIC METABOLIC PANEL
Anion gap: 9 (ref 5–15)
BUN: 12 mg/dL (ref 8–23)
CO2: 27 mmol/L (ref 22–32)
Calcium: 9 mg/dL (ref 8.9–10.3)
Chloride: 106 mmol/L (ref 98–111)
Creatinine, Ser: 0.9 mg/dL (ref 0.61–1.24)
GFR, Estimated: >60 mL/min (ref >60)
Glucose, Bld: 93 mg/dL (ref 70–99)
Potassium: 3.9 mmol/L (ref 3.5–5.1)
Sodium: 142 mmol/L (ref 135–145)

## 2020-12-12 LAB — CBC
HCT: 42.1 % (ref 39.0–52.0)
Hemoglobin: 14.4 g/dL (ref 13.0–17.0)
MCH: 29.8 pg (ref 26.0–34.0)
MCHC: 34.2 g/dL (ref 30.0–36.0)
MCV: 87 fL (ref 80.0–100.0)
Platelets: 215 10*3/uL (ref 150–400)
RBC: 4.84 MIL/uL (ref 4.22–5.81)
RDW: 12.3 % (ref 11.5–15.5)
WBC: 9.5 10*3/uL (ref 4.0–10.5)
nRBC: 0 % (ref 0.0–0.2)

## 2020-12-12 LAB — MAGNESIUM: Magnesium: 2.1 mg/dL (ref 1.7–2.4)

## 2020-12-12 LAB — PROCALCITONIN: Procalcitonin: <0.1 ng/mL

## 2020-12-12 LAB — LACTIC ACID, PLASMA: Lactic Acid, Venous: 1 mmol/L (ref 0.5–1.9)

## 2020-12-12 SURGERY — PICC LINE INSERTION
Anesthesia: Moderate Sedation

## 2020-12-12 MED ORDER — QUETIAPINE FUMARATE 25 MG PO TABS
12.5000 mg | ORAL_TABLET | Freq: Two times a day (BID) | ORAL | Status: DC
  Administered 2020-12-12 (×2): 12.5 mg via ORAL
  Filled 2020-12-12 (×2): qty 1

## 2020-12-12 NOTE — Progress Notes (Signed)
SURGICAL PROGRESS NOTE   Hospital Day(s): 1.   Post op day(s):  Marland Kitchen   Interval History: Patient seen and examined, no acute events or new complaints overnight. Patient seems to be pretty comfortable. Patient seems to be without pain. As per sitter and night nurse, patient has been having a lot of gas. There has been bowel movement documented. There has been no nausea or vomiting. Patient tolerated clear liquids.  Vital signs in last 24 hours: [min-max] current  Temp:  [97.5 F (36.4 C)-98.2 F (36.8 C)] 98.2 F (36.8 C) (01/17 0722) Pulse Rate:  [67-77] 76 (01/17 0722) Resp:  [16-20] 17 (01/17 0722) BP: (109-124)/(50-71) 119/71 (01/17 0722) SpO2:  [92 %-100 %] 95 % (01/17 0722)             Physical Exam:  Constitutional: alert, cooperative and no distress  Respiratory: breathing non-labored at rest  Cardiovascular: regular rate and sinus rhythm  Gastrointestinal: soft, non-tender, and non-distended  Labs:  CBC Latest Ref Rng & Units 12/11/2020 12/10/2020 05/07/2020  WBC 4.0 - 10.5 K/uL 6.6 10.2 10.0  Hemoglobin 13.0 - 17.0 g/dL 12.7(L) 14.1 12.0(L)  Hematocrit 39.0 - 52.0 % 37.1(L) 42.2 35.7(L)  Platelets 150 - 400 K/uL 173 215 211   CMP Latest Ref Rng & Units 12/11/2020 12/10/2020 05/10/2020  Glucose 70 - 99 mg/dL 84 833(A) 97  BUN 8 - 23 mg/dL 14 19 8   Creatinine 0.61 - 1.24 mg/dL 2.50 5.39  Sodium 135 - 145 mmol/L 136 136 144  Potassium 3.5 - 5.1 mmol/L 3.0(L) 3.3(L) 3.4(L)  Chloride 98 - 111 mmol/L 99 98 111  CO2 22 - 32 mmol/L 26 24 25   Calcium 8.9 - 10.3 mg/dL 7.67) 9.1 )  Total Protein 6.5 - 8.1 g/dL - 7.5 -  Total Bilirubin 0.3 - 1.2 mg/dL - 0.9 -  Alkaline Phos 38 - 126 U/L - 51 -  AST 15 - 41 U/L - 31 -  ALT 0 - 44 U/L - 24 -    Imaging studies:    Xray from yesterday:                                           Xray from a year ago:    Assessment/Plan:  70 y.o.maleadmitted with poor PO intake, complicated by pertinent comorbidities  includinggeneralized small and large bowel ileus, versus obstruction.  Imaging concerning of bowel obstruction vs ileus. Comparing previous imaging, there is no significant change of bowel dilation, actually seems to be less dilated. Agree with differential diagnosis of ileus most likely from GI dysmotility issues. Patient tolerated clear liquids, the abdomen is soft, the patient is passing gas. I will advance his diet to full liquids and he may continue to be advance as tolerated.  9.3(X, MD

## 2020-12-12 NOTE — Progress Notes (Signed)
Received verbal consent from patients brother via the phone Kemani Demarais [phone]) for insertion with PICC line on 12/13/19 @ 922am.

## 2020-12-12 NOTE — Progress Notes (Addendum)
PROGRESS NOTE  Richard Gillespie WNU:[id number] DOB: Aug 25, 1951 DOA: 12/10/2020 PCP: Gracelyn Nurse, MD  HPI/Recap of past 24 hours: Richard Gillespie is a 70 y.o. male with medical history significant for hypertension, seizure disorder, severe intellectual disability, history of recurrent small bowel obstructions status post laparotomy and lysis of adhesion, presented from group home to the emergency department for chief concerns of poor p.o. intake since 12/09/2020.  HPI obtained from caregiver as patient has severe learning disability.  Associated with loose/watery bowel movements in bed several times for the last two nights.  No fever, nausea, or vomiting.   His brother reports that the last time he had a breakthrough seizure was several years ago.  Abdominal x-ray 12/10/20 indicative of small bowel obstruction.  Seen by general surgery, they are quite familiar with this patient.  Per general surgery this is likely ileus likely from GI dysmotility rather than small bowel obstruction.  We started on clear liquid diet on 12/11/2020, tolerated well, was advanced to full liquid and now he is on soft diet.  Hospital course complicated by poor IV access for which vascular surgery was consulted.  Due to improvement of symptomatology PICC line placement with sedation has been placed on hold.  Per general surgery if patient can tolerate soft diet can discharge to previous environment and setting.  12/12/20: Patient was seen and examined at his bedside.  He had some agitation earlier and was given a dose of Ativan.  At the time of this visit he is calm and resting.   Assessment/Plan: Active Problems:   Ileus (HCC)  Recurrent questionable small bowel obstruction, most likely ileus in the setting of GI dysmotility  Admission x-ray 12/10/20 significant for dilatation of small bowel consistent with a small bowel obstruction. History of recurrent small bowel obstructions No NG tube placed as the patient has  had no nausea or vomiting. Seen by general surgery, they are quite familiar with this patient.  Per general surgery this is likely ileus likely from GI dysmotility rather than small bowel obstruction.  We started on clear liquid diet on 12/11/2020, tolerated well, was advanced to full liquid and now he is on soft diet. Closely monitor electrolytes, goal potassium greater than 4.0, magnesium greater than 2.0. IV fluid hydration, pain control, chemical DVT prophylaxis. Mobilize if able.  Resolved post repletion: Hypokalemia Serum potassium 3.0>> 3.9 Repleted orally Serum magnesium 2.1  Seizure disorder Continue home AEDs Continue seizure precautions  Severe intellectual disability/chronic restlessness Per his brother via phone on 12/11/2020 he is restless at baseline Continue home medications  BPH Continue Flomax Monitor urine output None recorded, wear a depends   Code Status: DNR  Family Communication: Updated his brother via phone on 12/11/20  Disposition Plan: Likely will return to previous home environment.   Consultants:  General surgery  Procedures:  None.  Antimicrobials:  None.  DVT prophylaxis: Subcu heparin 3 times daily  Status is: Inpatient    Dispo:  Patient From: Group home  Planned Disposition: Group home  Expected discharge date: 12/14/2020  Medically stable for discharge: No, ongoing management of ileus.         Objective: Vitals:   12/11/20 1932 12/11/20 2253 12/12/20 0420 12/12/20 0722  BP: 124/68 (!) 109/59 (!) 114/50 119/71  Pulse: 76 77 72 76  Resp: 20 17 18 17   Temp: 97.9 F (36.6 C) (!) 97.5 F (36.4 C) 97.7 F (36.5 C) 98.2 F (36.8 C)  TempSrc: Oral Oral  Oral  SpO2: 94% 92% 96% 95%    Intake/Output Summary (Last 24 hours) at 12/12/2020 1353 Last data filed at 12/12/2020 0800 Gross per 24 hour  Intake 240 ml  Output --  Net 240 ml   There were no vitals filed for this visit.  Exam:  . General: 70 y.o.  year-old male very frail-appearing no acute distress.  Resting after dose of Ativan.   . Cardiovascular: Regular rate and rhythm no rubs or gallops.  Marland Kitchen Respiratory: Clear to auscultation no wheezes rales.   . Abdomen: Soft nontender hypoactive bowel sounds present. . Musculoskeletal: No lower extremity edema bilaterally . Psychiatry: Mood is appropriate for condition   Data Reviewed: CBC: Recent Labs  Lab 12/10/20 1320 12/11/20 0427 12/12/20 0843  WBC 10.2 6.6 9.5  NEUTROABS 7.5  --   --   HGB 14.1 12.7* 14.4  HCT 42.2 37.1* 42.1  MCV 87.9 86.7 87.0  PLT 215 173 215   Basic Metabolic Panel: Recent Labs  Lab 12/10/20 1320 12/11/20 0427 12/12/20 0843  NA 136 136 142  K 3.3* 3.0* 3.9  CL 98 99 106  CO2 24 26 27   GLUCOSE 108* 84 93  BUN 19 14 12   CREATININE 1.06 0.90 0.90  CALCIUM 9.1 8.5* 9.0  MG  --   --  2.1   GFR: CrCl cannot be calculated (Unknown ideal weight.). Liver Function Tests: Recent Labs  Lab 12/10/20 1320  AST 31  ALT 24  ALKPHOS 51  BILITOT 0.9  PROT 7.5  ALBUMIN 4.4   Recent Labs  Lab 12/10/20 1320  LIPASE 23   No results for input(s): AMMONIA in the last 168 hours. Coagulation Profile: No results for input(s): INR, PROTIME in the last 168 hours. Cardiac Enzymes: No results for input(s): CKTOTAL, CKMB, CKMBINDEX, TROPONINI in the last 168 hours. BNP (last 3 results) No results for input(s): PROBNP in the last 8760 hours. HbA1C: No results for input(s): HGBA1C in the last 72 hours. CBG: No results for input(s): GLUCAP in the last 168 hours. Lipid Profile: No results for input(s): CHOL, HDL, LDLCALC, TRIG, CHOLHDL, LDLDIRECT in the last 72 hours. Thyroid Function Tests: No results for input(s): TSH, T4TOTAL, FREET4, T3FREE, THYROIDAB in the last 72 hours. Anemia Panel: No results for input(s): VITAMINB12, FOLATE, FERRITIN, TIBC, IRON, RETICCTPCT in the last 72 hours. Urine analysis:    Component Value Date/Time   COLORURINE AMBER  (A) 12/10/2020 1700   APPEARANCEUR HAZY (A) 12/10/2020 1700   LABSPEC 1.025 12/10/2020 1700   PHURINE 5.0 12/10/2020 1700   GLUCOSEU NEGATIVE 12/10/2020 1700   HGBUR NEGATIVE 12/10/2020 1700   BILIRUBINUR NEGATIVE 12/10/2020 1700   KETONESUR 5 (A) 12/10/2020 1700   PROTEINUR 100 (A) 12/10/2020 1700   NITRITE NEGATIVE 12/10/2020 1700   LEUKOCYTESUR NEGATIVE 12/10/2020 1700   Sepsis Labs: @LABRCNTIP (procalcitonin:4,lacticidven:4)  ) Recent Results (from the past 240 hour(s))  Resp Panel by RT-PCR (Flu A&B, Covid) Nasopharyngeal Swab     Status: None   Collection Time: 12/10/20  3:04 PM   Specimen: Nasopharyngeal Swab; Nasopharyngeal(NP) swabs in vial transport medium  Result Value Ref Range Status   SARS Coronavirus 2 by RT PCR NEGATIVE NEGATIVE Final    Comment: (NOTE) SARS-CoV-2 target nucleic acids are NOT DETECTED.  The SARS-CoV-2 RNA is generally detectable in upper respiratory specimens during the acute phase of infection. The lowest concentration of SARS-CoV-2 viral copies this assay can detect is 138 copies/mL. A negative result does not preclude SARS-Cov-2 infection and should not  be used as the sole basis for treatment or other patient management decisions. A negative result may occur with  improper specimen collection/handling, submission of specimen other than nasopharyngeal swab, presence of viral mutation(s) within the areas targeted by this assay, and inadequate number of viral copies(<138 copies/mL). A negative result must be combined with clinical observations, patient history, and epidemiological information. The expected result is Negative.  Fact Sheet for Patients:  BloggerCourse.com  Fact Sheet for Healthcare Providers:  SeriousBroker.it  This test is no t yet approved or cleared by the [place] FDA and  has been authorized for detection and/or diagnosis of SARS-CoV-2 by FDA under an Emergency Use  Authorization (EUA). This EUA will remain  in effect (meaning this test can be used) for the duration of the COVID-19 declaration under Section 564(b)(1) of the Act, 21 U.S.C.section 360bbb-3(b)(1), unless the authorization is terminated  or revoked sooner.       Influenza A by PCR NEGATIVE NEGATIVE Final   Influenza B by PCR NEGATIVE NEGATIVE Final    Comment: (NOTE) The Xpert Xpress SARS-CoV-2/FLU/RSV plus assay is intended as an aid in the diagnosis of influenza from Nasopharyngeal swab specimens and should not be used as a sole basis for treatment. Nasal washings and aspirates are unacceptable for Xpert Xpress SARS-CoV-2/FLU/RSV testing.  Fact Sheet for Patients: BloggerCourse.com  Fact Sheet for Healthcare Providers: SeriousBroker.it  This test is not yet approved or cleared by the [place] FDA and has been authorized for detection and/or diagnosis of SARS-CoV-2 by FDA under an Emergency Use Authorization (EUA). This EUA will remain in effect (meaning this test can be used) for the duration of the COVID-19 declaration under Section 564(b)(1) of the Act, 21 U.S.C. section 360bbb-3(b)(1), unless the authorization is terminated or revoked.  Performed at [facility], [address] [place], [place] [address]       Studies: DG Abd Portable 1V  Result Date: 12/11/2020 CLINICAL DATA:  Possible small bowel obstruction. EXAM: PORTABLE ABDOMEN - 1 VIEW COMPARISON:  12/10/2020 and CT 12/07/2019 FINDINGS: Bowel gas pattern demonstrates air throughout the colon. Again noted is a single dilated small bowel loop over the left mid to lower abdomen measuring 5.2 cm in diameter. No definite free peritoneal air. Remainder of the exam is unchanged. IMPRESSION: Persistent single dilated small bowel loop over the left mid to lower abdomen which may be due to early/partial small bowel obstruction. Electronically Signed   By:  Elberta Fortis M.D.   On: 12/11/2020 14:31    Scheduled Meds: . carbamazepine  200 mg Oral BID  . heparin  5,000 Units Subcutaneous Q8H  . lamoTRIgine  150 mg Oral BID  . levETIRAcetam  500 mg Oral BID  . medroxyPROGESTERone  10 mg Oral Daily  . polyethylene glycol  17 g Oral Daily  . tamsulosin  0.4 mg Oral Daily  . traZODone  300 mg Oral QHS    Continuous Infusions: . lactated ringers Stopped (12/11/20 1027)     LOS: 1 day     Darlin Drop, MD [facility] Pager [phone]  If 7PM-7AM, please contact night-coverage [email or web address] Password [email or web address] 12/12/2020, 1:53 PM

## 2020-12-12 NOTE — Progress Notes (Signed)
PICC will be placed in IR per secure chat with Cleda Daub A, PA-C,  not safe for bedside PICC placement due to restlessness/agitaion. Patient need to be sedated.

## 2020-12-13 DIAGNOSIS — G40909 Epilepsy, unspecified, not intractable, without status epilepticus: Secondary | ICD-10-CM | POA: Diagnosis not present

## 2020-12-13 DIAGNOSIS — K567 Ileus, unspecified: Secondary | ICD-10-CM | POA: Diagnosis not present

## 2020-12-13 DIAGNOSIS — F79 Unspecified intellectual disabilities: Secondary | ICD-10-CM

## 2020-12-13 LAB — CBC
HCT: 41.1 % (ref 39.0–52.0)
Hemoglobin: 14.3 g/dL (ref 13.0–17.0)
MCH: 30.1 pg (ref 26.0–34.0)
MCHC: 34.8 g/dL (ref 30.0–36.0)
MCV: 86.5 fL (ref 80.0–100.0)
Platelets: 209 10*3/uL (ref 150–400)
RBC: 4.75 MIL/uL (ref 4.22–5.81)
RDW: 12.3 % (ref 11.5–15.5)
WBC: 8.4 10*3/uL (ref 4.0–10.5)
nRBC: 0 % (ref 0.0–0.2)

## 2020-12-13 LAB — BASIC METABOLIC PANEL
Anion gap: 10 (ref 5–15)
BUN: 16 mg/dL (ref 8–23)
CO2: 28 mmol/L (ref 22–32)
Calcium: 8.9 mg/dL (ref 8.9–10.3)
Chloride: 107 mmol/L (ref 98–111)
Creatinine, Ser: 0.78 mg/dL (ref 0.61–1.24)
GFR, Estimated: >60 mL/min (ref >60)
Glucose, Bld: 145 mg/dL — ABNORMAL HIGH (ref 70–99)
Potassium: 3.6 mmol/L (ref 3.5–5.1)
Sodium: 145 mmol/L (ref 135–145)

## 2020-12-13 SURGERY — DIALYSIS/PERMA CATHETER INSERTION
Anesthesia: Moderate Sedation

## 2020-12-13 MED ORDER — QUETIAPINE FUMARATE 25 MG PO TABS
12.5000 mg | ORAL_TABLET | Freq: Two times a day (BID) | ORAL | Status: DC
  Administered 2020-12-13: 12.5 mg via ORAL
  Filled 2020-12-13: qty 1

## 2020-12-13 NOTE — Progress Notes (Signed)
SURGICAL PROGRESS NOTE   Hospital Day(s): 2.   Post op day(s):  Marland Kitchen   Interval History: Patient seen and examined, no acute events or new complaints overnight.  Patient's nurse reported that patient had a good bowel movement last night.  She also reported that the patient ate his breakfast.  Patient reports feeling well.  Vital signs in last 24 hours: [min-max] current  Temp:  [97.7 F (36.5 C)-98.6 F (37 C)] 98.6 F (37 C) (01/18 0806) Pulse Rate:  [70-116] 87 (01/18 0806) Resp:  [18-20] 20 (01/18 0806) BP: (110-127)/(61-95) 110/95 (01/18 0806) SpO2:  [94 %-99 %] 99 % (01/18 0806)             Physical Exam:  Constitutional: alert, cooperative and no distress  Respiratory: breathing non-labored at rest  Cardiovascular: regular rate and sinus rhythm  Gastrointestinal: soft, non-tender, and non-distended  Labs:  CBC Latest Ref Rng & Units 12/13/2020 12/12/2020 12/11/2020  WBC 4.0 - 10.5 K/uL 8.4 9.5 6.6  Hemoglobin 13.0 - 17.0 g/dL 83.4 19.6 12.7(L)  Hematocrit 39.0 - 52.0 % 41.1 42.1 37.1(L)  Platelets 150 - 400 K/uL 209 215 173   CMP Latest Ref Rng & Units 12/13/2020 12/12/2020 12/11/2020  Glucose 70 - 99 mg/dL 222(L) 93 84  BUN 8 - 23 mg/dL 16 12 14   Creatinine 0.61 - 1.24 mg/dL 7.98 9.21  Sodium 135 - 145 mmol/L 145 142 136  Potassium 3.5 - 5.1 mmol/L 3.6 3.9 3.0(L)  Chloride 98 - 111 mmol/L 107 106 99  CO2 22 - 32 mmol/L 28 27 26   Calcium 8.9 - 10.3 mg/dL 8.9 9.0 1.94)  Total Protein 6.5 - 8.1 g/dL - - -  Total Bilirubin 0.3 - 1.2 mg/dL - - -  Alkaline Phos 38 - 126 U/L - - -  AST 15 - 41 U/L - - -  ALT 0 - 44 U/L - - -    Imaging studies: No new pertinent imaging studies   Assessment/Plan:  70 y.o.maleadmitted with poor PO intake, complicated by pertinent comorbidities includinggeneralized small and large bowel ileus, versus obstruction.  Today the patient is tolerating adequate oral intake and had a bowel movement.  At this moment the ileus seems to be  resolved.  Patient can continue regular diet at home.  There is no need for surgical follow-up after discharge.  No further imaging, work-up or management needed at this moment.  Contact 1.7(E if there is any concern.  73, MD

## 2020-12-13 NOTE — Progress Notes (Signed)
PT Cancellation Note  Patient Details Name: Richard Gillespie MRN: [id number] DOB: 1951-09-05   Cancelled Treatment:    Reason Eval/Treat Not Completed: Patient declined, no reason specified Per sitter in room reports that pt was trying to get up earlier.  When PT introduced himself and encouraged activity there was an almost reflexive response of "No, No, No!"   Given that this response to activity seemed similar to previous admits it may be hit-or-miss as to his willingness to participate.  Will continue to follow and attempt as appropriate.  Malachi Pro, DPT 12/13/2020, 10:09 AM

## 2020-12-13 NOTE — Progress Notes (Signed)
Patient's brother and POA was called and updated about patient's status and discharge plans. Patient's brother said he would call the group home this evening to chat with the patient.

## 2020-12-13 NOTE — Discharge Summary (Signed)
Physician Discharge Summary   Richard Gillespie ZOX:[id number] DOB: May 08, 1951 DOA: 12/10/2020  PCP: Gracelyn NurseJohnston, John D, MD  Admit date: 12/10/2020 Discharge date: 12/13/2020  Admitted From: Home Disposition:  Back to group home Discharging physician: Lewie Chamberavid Richard Rody, MD  Recommendations for Outpatient Follow-up:  1. No outpatient follow-up needed per surgery  Patient discharged to home in Discharge Condition: stable CODE STATUS: DNR Diet recommendation:  Diet Orders (From admission, onward)    Start     Ordered   12/12/20 1202  DIET SOFT Room service appropriate? Yes; Fluid consistency: Thin  Diet effective now       Question Answer Comment  Room service appropriate? Yes   Fluid consistency: Thin      12/12/20 1201          Hospital Course:  Richard BruinsMark T Pruittis a 70 y.o.malewith medical history significant forhypertension, seizure disorder, severe intellectual disability (resides in a group home), history of recurrent small bowel obstructionsstatus post laparotomy and lysis of adhesion,presented from group home to the emergency department for chief concerns of poor p.o. intake since 12/09/2020.  His brother reports that the last time he had a breakthrough seizure was several years ago.  Abdominal x-ray 12/10/20 indicative of small bowel obstruction.  Seen by general surgery, they are quite familiar with this patient.  Per general surgery this is likely ileus likely from GI dysmotility rather than small bowel obstruction.  We started on clear liquid diet on 12/11/2020, tolerated well, was advanced to full liquid and then to soft diet.  Which he tolerated well. He also had return of bowel function and was noted to have a bowel movement per nursing staff prior to discharge. As he was tolerating oral intake once again and had return of bowel function, he was considered stable for discharging back to his group home.  General surgery had also signed off and recommended for repeat  consultation for any further recurrence.  He was not recommended to have any outpatient follow-up with surgery either.   The patient's chronic medical conditions were treated accordingly per the patient's home medication regimen except as noted.  On day of discharge, patient was felt deemed stable for discharge. Patient/family member advised to call PCP or come back to ER if needed.   Principal Diagnosis: Ileus [facility](HCC)  Discharge Diagnoses: Active Hospital Problems   Diagnosis Date Noted  . Intellectual disability 12/13/2020  . Seizure disorder [facility](HCC) 09/12/2018    Resolved Hospital Problems   Diagnosis Date Noted Date Resolved  . Ileus (HCC) 12/10/2020 12/13/2020    Priority: High    Discharge Instructions    Increase activity slowly   Complete by: As directed      Allergies as of 12/13/2020      Reactions   Zithromax [azithromycin] Other (See Comments)   unknown      Medication List    TAKE these medications   bisacodyl 10 MG suppository Commonly known as: DULCOLAX Place 10 mg rectally as needed for moderate constipation.   carbamazepine 200 MG 12 hr tablet Commonly known as: TEGRETOL XR Take 1 tablet (200 mg total) by mouth 2 (two) times daily.   cloNIDine 0.1 mg/24hr patch Commonly known as: CATAPRES - Dosed in mg/24 hr Place 0.1 mg onto the skin every Friday.   lactulose 10 GM/15ML solution Commonly known as: CHRONULAC Take 10 g by mouth 2 (two) times daily as needed for mild constipation.   lamoTRIgine 150 MG tablet Commonly known as: LAMICTAL Take 1 tablet (150  mg total) by mouth 2 (two) times daily.   levETIRAcetam 500 MG tablet Commonly known as: Keppra Take 1 tablet (500 mg total) by mouth 2 (two) times daily.   loratadine 10 MG tablet Commonly known as: CLARITIN Take 10 mg by mouth daily.   magnesium citrate Soln Take 1 Bottle by mouth daily as needed for severe constipation (at least 2 days of no BM).   medroxyPROGESTERone 10 MG tablet Commonly  known as: PROVERA Take 10 mg by mouth daily.   polyethylene glycol 17 g packet Commonly known as: MIRALAX / GLYCOLAX Take 17 g by mouth daily.   senna 8.6 MG Tabs tablet Commonly known as: SENOKOT Take 2 tablets by mouth 2 (two) times daily.   tamsulosin 0.4 MG Caps capsule Commonly known as: FLOMAX Take 1 capsule (0.4 mg total) by mouth daily.   Therems Tabs Take 1 tablet by mouth daily.   traZODone 150 MG tablet Commonly known as: DESYREL Take 300 mg by mouth at bedtime.       Allergies  Allergen Reactions  . Zithromax [Azithromycin] Other (See Comments)    unknown    Consultations: Surgery  Discharge Exam: BP (!) 110/95 (BP Location: Left Arm)   Pulse 87   Temp 98.6 F (37 C)   Resp 20   SpO2 99%  General appearance: Awake, alert, no distress Head: Normocephalic, without obvious abnormality, atraumatic Eyes: EOMI Lungs: clear to auscultation bilaterally Heart: regular rate and rhythm and S1, S2 normal Abdomen: normal findings: bowel sounds normal and soft, non-tender Extremities: No edema Skin: mobility and turgor normal Neurologic: Intellectual disability appreciated however follows some commands and moves all 4 extremities well  The results of significant diagnostics from this hospitalization (including imaging, microbiology, ancillary and laboratory) are listed below for reference.   Microbiology: Recent Results (from the past 240 hour(s))  Resp Panel by RT-PCR (Flu A&B, Covid) Nasopharyngeal Swab     Status: None   Collection Time: 12/10/20  3:04 PM   Specimen: Nasopharyngeal Swab; Nasopharyngeal(NP) swabs in vial transport medium  Result Value Ref Range Status   SARS Coronavirus 2 by RT PCR NEGATIVE NEGATIVE Final    Comment: (NOTE) SARS-CoV-2 target nucleic acids are NOT DETECTED.  The SARS-CoV-2 RNA is generally detectable in upper respiratory specimens during the acute phase of infection. The lowest concentration of SARS-CoV-2 viral copies  this assay can detect is 138 copies/mL. A negative result does not preclude SARS-Cov-2 infection and should not be used as the sole basis for treatment or other patient management decisions. A negative result may occur with  improper specimen collection/handling, submission of specimen other than nasopharyngeal swab, presence of viral mutation(s) within the areas targeted by this assay, and inadequate number of viral copies(<138 copies/mL). A negative result must be combined with clinical observations, patient history, and epidemiological information. The expected result is Negative.  Fact Sheet for Patients:  BloggerCourse.com  Fact Sheet for Healthcare Providers:  SeriousBroker.it  This test is no t yet approved or cleared by the [place] FDA and  has been authorized for detection and/or diagnosis of SARS-CoV-2 by FDA under an Emergency Use Authorization (EUA). This EUA will remain  in effect (meaning this test can be used) for the duration of the COVID-19 declaration under Section 564(b)(1) of the Act, 21 U.S.C.section 360bbb-3(b)(1), unless the authorization is terminated  or revoked sooner.       Influenza A by PCR NEGATIVE NEGATIVE Final   Influenza B by PCR NEGATIVE NEGATIVE Final  Comment: (NOTE) The Xpert Xpress SARS-CoV-2/FLU/RSV plus assay is intended as an aid in the diagnosis of influenza from Nasopharyngeal swab specimens and should not be used as a sole basis for treatment. Nasal washings and aspirates are unacceptable for Xpert Xpress SARS-CoV-2/FLU/RSV testing.  Fact Sheet for Patients: BloggerCourse.com  Fact Sheet for Healthcare Providers: SeriousBroker.it  This test is not yet approved or cleared by the [place] FDA and has been authorized for detection and/or diagnosis of SARS-CoV-2 by FDA under an Emergency Use Authorization (EUA). This EUA  will remain in effect (meaning this test can be used) for the duration of the COVID-19 declaration under Section 564(b)(1) of the Act, 21 U.S.C. section 360bbb-3(b)(1), unless the authorization is terminated or revoked.  Performed at [facility], [address] [place], [place] [address]      Labs: BNP (last 3 results) No results for input(s): BNP in the last 8760 hours. Basic Metabolic Panel: Recent Labs  Lab 12/10/20 1320 12/11/20 0427 12/12/20 0843 12/13/20 0449  NA 136 136 142 145  K 3.3* 3.0* 3.9 3.6  CL 98 99 106 107  CO2 24 26 27 28   GLUCOSE 108* 84 93 145*  BUN 19 14 12 16   CREATININE 1.06 0.90 0.90 0.78  CALCIUM 9.1 8.5* 9.0 8.9  MG  --   --  2.1  --    Liver Function Tests: Recent Labs  Lab 12/10/20 1320  AST 31  ALT 24  ALKPHOS 51  BILITOT 0.9  PROT 7.5  ALBUMIN 4.4   Recent Labs  Lab 12/10/20 1320  LIPASE 23   No results for input(s): AMMONIA in the last 168 hours. CBC: Recent Labs  Lab 12/10/20 1320 12/11/20 0427 12/12/20 0843 12/13/20 0449  WBC 10.2 6.6 9.5 8.4  NEUTROABS 7.5  --   --   --   HGB 14.1 12.7* 14.4 14.3  HCT 42.2 37.1* 42.1 41.1  MCV 87.9 86.7 87.0 86.5  PLT 215 173 215 209   Cardiac Enzymes: No results for input(s): CKTOTAL, CKMB, CKMBINDEX, TROPONINI in the last 168 hours. BNP: Invalid input(s): POCBNP CBG: No results for input(s): GLUCAP in the last 168 hours. D-Dimer No results for input(s): DDIMER in the last 72 hours. Hgb A1c No results for input(s): HGBA1C in the last 72 hours. Lipid Profile No results for input(s): CHOL, HDL, LDLCALC, TRIG, CHOLHDL, LDLDIRECT in the last 72 hours. Thyroid function studies No results for input(s): TSH, T4TOTAL, T3FREE, THYROIDAB in the last 72 hours.  Invalid input(s): FREET3 Anemia work up No results for input(s): VITAMINB12, FOLATE, FERRITIN, TIBC, IRON, RETICCTPCT in the last 72 hours. Urinalysis    Component Value Date/Time   COLORURINE AMBER (A)  12/10/2020 1700   APPEARANCEUR HAZY (A) 12/10/2020 1700   LABSPEC 1.025 12/10/2020 1700   PHURINE 5.0 12/10/2020 1700   GLUCOSEU NEGATIVE 12/10/2020 1700   HGBUR NEGATIVE 12/10/2020 1700   BILIRUBINUR NEGATIVE 12/10/2020 1700   KETONESUR 5 (A) 12/10/2020 1700   PROTEINUR 100 (A) 12/10/2020 1700   NITRITE NEGATIVE 12/10/2020 1700   LEUKOCYTESUR NEGATIVE 12/10/2020 1700   Sepsis Labs Invalid input(s): PROCALCITONIN,  WBC,  LACTICIDVEN Microbiology Recent Results (from the past 240 hour(s))  Resp Panel by RT-PCR (Flu A&B, Covid) Nasopharyngeal Swab     Status: None   Collection Time: 12/10/20  3:04 PM   Specimen: Nasopharyngeal Swab; Nasopharyngeal(NP) swabs in vial transport medium  Result Value Ref Range Status   SARS Coronavirus 2 by RT PCR NEGATIVE NEGATIVE Final  Comment: (NOTE) SARS-CoV-2 target nucleic acids are NOT DETECTED.  The SARS-CoV-2 RNA is generally detectable in upper respiratory specimens during the acute phase of infection. The lowest concentration of SARS-CoV-2 viral copies this assay can detect is 138 copies/mL. A negative result does not preclude SARS-Cov-2 infection and should not be used as the sole basis for treatment or other patient management decisions. A negative result may occur with  improper specimen collection/handling, submission of specimen other than nasopharyngeal swab, presence of viral mutation(s) within the areas targeted by this assay, and inadequate number of viral copies(<138 copies/mL). A negative result must be combined with clinical observations, patient history, and epidemiological information. The expected result is Negative.  Fact Sheet for Patients:  BloggerCourse.com  Fact Sheet for Healthcare Providers:  SeriousBroker.it  This test is no t yet approved or cleared by the [place] FDA and  has been authorized for detection and/or diagnosis of SARS-CoV-2 by FDA under an  Emergency Use Authorization (EUA). This EUA will remain  in effect (meaning this test can be used) for the duration of the COVID-19 declaration under Section 564(b)(1) of the Act, 21 U.S.C.section 360bbb-3(b)(1), unless the authorization is terminated  or revoked sooner.       Influenza A by PCR NEGATIVE NEGATIVE Final   Influenza B by PCR NEGATIVE NEGATIVE Final    Comment: (NOTE) The Xpert Xpress SARS-CoV-2/FLU/RSV plus assay is intended as an aid in the diagnosis of influenza from Nasopharyngeal swab specimens and should not be used as a sole basis for treatment. Nasal washings and aspirates are unacceptable for Xpert Xpress SARS-CoV-2/FLU/RSV testing.  Fact Sheet for Patients: BloggerCourse.com  Fact Sheet for Healthcare Providers: SeriousBroker.it  This test is not yet approved or cleared by the [place] FDA and has been authorized for detection and/or diagnosis of SARS-CoV-2 by FDA under an Emergency Use Authorization (EUA). This EUA will remain in effect (meaning this test can be used) for the duration of the COVID-19 declaration under Section 564(b)(1) of the Act, 21 U.S.C. section 360bbb-3(b)(1), unless the authorization is terminated or revoked.  Performed at [facility], [address] [place], [place] [address]     Procedures/Studies: [place] Abdomen 1 View  Result Date: 12/10/2020 CLINICAL DATA:  Clinical signs of small-bowel obstruction. Poor p.o. intake. Diarrhea. EXAM: ABDOMEN - 1 VIEW COMPARISON:  08/05/2019. FINDINGS: There is dilated small bowel with significant gaseous distention of the stomach. No convincing colonic dilation. Soft tissues are poorly defined due to overlying bowel gas. No convincing renal or ureteral stone. Skeletal structures show no acute findings. IMPRESSION: 1. Significant dilation of small bowel consistent with a small-bowel obstruction. Findings are similar to the prior  radiographs. Electronically Signed   By: Amie Portland M.D.   On: 12/10/2020 16:49   DG Chest Portable 1 View  Result Date: 12/10/2020 CLINICAL DATA:  Covid exposure, weakness. Care taker at bedside states that patient hasn't eaten in 2 days and has had diarrhea x 2 but this am BM was normal. EXAM: PORTABLE CHEST 1 VIEW COMPARISON:  08/09/2019. FINDINGS: Cardiac silhouette is normal in size, but shifted to the right, similar to prior studies. No mediastinal or hilar masses. Elevated left hemidiaphragm.  Stomach moderately distended with air. Lungs are clear.  No pleural effusion and no pneumothorax. Skeletal structures are grossly intact. IMPRESSION: No acute cardiopulmonary disease. Electronically Signed   By: Amie Portland M.D.   On: 12/10/2020 15:34   DG Abd Portable 1V  Result Date: 12/11/2020 CLINICAL DATA:  Possible small bowel obstruction. EXAM: PORTABLE ABDOMEN - 1 VIEW COMPARISON:  12/10/2020 and CT 12/07/2019 FINDINGS: Bowel gas pattern demonstrates air throughout the colon. Again noted is a single dilated small bowel loop over the left mid to lower abdomen measuring 5.2 cm in diameter. No definite free peritoneal air. Remainder of the exam is unchanged. IMPRESSION: Persistent single dilated small bowel loop over the left mid to lower abdomen which may be due to early/partial small bowel obstruction. Electronically Signed   By: Elberta Fortisaniel  Boyle M.D.   On: 12/11/2020 14:31   US EKG SITE RITE  Result Date: 12/11/2020 If Site Rite image not attached, placement could not be confirmed due to current cardiac rhythm.    Time coordinating discharge: Over 30 minutes    Lewie Chamberavid Jamell Opfer, MD  [facility] 12/13/2020, 1:50 PM

## 2020-12-13 NOTE — Progress Notes (Signed)
Retia Passe to be D/C'd to group home per MD order.  Discussed prescriptions and follow up appointments with the patient. Prescriptions given to patient, medication list explained in detail. Pt verbalized understanding.  Allergies as of 12/13/2020       Reactions   Zithromax [azithromycin] Other (See Comments)   unknown        Medication List     TAKE these medications    bisacodyl 10 MG suppository Commonly known as: DULCOLAX Place 10 mg rectally as needed for moderate constipation.   carbamazepine 200 MG 12 hr tablet Commonly known as: TEGRETOL XR Take 1 tablet (200 mg total) by mouth 2 (two) times daily.   cloNIDine 0.1 mg/24hr patch Commonly known as: CATAPRES - Dosed in mg/24 hr Place 0.1 mg onto the skin every Friday.   lactulose 10 GM/15ML solution Commonly known as: CHRONULAC Take 10 g by mouth 2 (two) times daily as needed for mild constipation.   lamoTRIgine 150 MG tablet Commonly known as: LAMICTAL Take 1 tablet (150 mg total) by mouth 2 (two) times daily.   levETIRAcetam 500 MG tablet Commonly known as: Keppra Take 1 tablet (500 mg total) by mouth 2 (two) times daily.   loratadine 10 MG tablet Commonly known as: CLARITIN Take 10 mg by mouth daily.   magnesium citrate Soln Take 1 Bottle by mouth daily as needed for severe constipation (at least 2 days of no BM).   medroxyPROGESTERone 10 MG tablet Commonly known as: PROVERA Take 10 mg by mouth daily.   polyethylene glycol 17 g packet Commonly known as: MIRALAX / GLYCOLAX Take 17 g by mouth daily.   senna 8.6 MG Tabs tablet Commonly known as: SENOKOT Take 2 tablets by mouth 2 (two) times daily.   tamsulosin 0.4 MG Caps capsule Commonly known as: FLOMAX Take 1 capsule (0.4 mg total) by mouth daily.   Therems Tabs Take 1 tablet by mouth daily.   traZODone 150 MG tablet Commonly known as: DESYREL Take 300 mg by mouth at bedtime.        Vitals:   12/13/20 0323 12/13/20 0806  BP: 116/69  (!) 110/95  Pulse: 70 87  Resp: 18 20  Temp: 97.9 F (36.6 C) 98.6 F (37 C)  SpO2: 94% 99%    Skin clean, dry and intact without evidence of skin break down, no evidence of skin tears noted. IV catheter discontinued intact. Site without signs and symptoms of complications. Dressing and pressure applied. Pt denies pain at this time. No complaints noted.  An After Visit Summary was printed and given to the patient. Patient escorted via WC, and D/C home via private auto.  Arsen Mangione A Arben Packman

## 2020-12-13 NOTE — TOC Transition Note (Signed)
Transition of Care [facility]) - CM/SW Discharge Note   Patient Details  Name: Richard Gillespie MRN: [id number] Date of Birth: 09/03/1951  [facility] CM/SW Contact:  Chapman Fitch, RN Phone Number: 12/13/2020, 3:02 PM   Clinical Narrative:     [facility] Bedside RN have notified Brother  Fl2, DC summary, and labs faxed to Ulyess Blossom at group home.  Hard copy also placed in DC packet by bedside RN  Bed side RN to call report Group home to provide transport today between [phone]  Final next level of care: Group Home Barriers to Discharge: No Barriers Identified   Patient Goals and CMS Choice        Discharge Placement                Patient to be transferred to facility by: Group home Name of family member notified: Brother otified by bedside RN    Discharge Plan and Services                                     Social Determinants of Health (SDOH) Interventions     Readmission Risk Interventions No flowsheet data found.

## 2020-12-13 NOTE — Progress Notes (Signed)
OT Cancellation Note  Patient Details Name: Richard Gillespie MRN: [id number] DOB: 12/03/1950   Cancelled Treatment:    Reason Eval/Treat Not Completed: Patient declined, no reason specified. Consult received, chart reviewed. Spoke with PT. Pt refusing therapy at this time. Will re-attempt at later date/time as pt is able to participate.  [name], MPH, MS, OTR/L ascom [phone] 12/13/20, 11:17 AM

## 2020-12-13 NOTE — Care Management (Signed)
Procedure Component Value Units Date/Time  Basic metabolic panel [[id number]] (Abnormal) Collected: 12/13/20 0449  Specimen: Blood Updated: 12/13/20 0539   Sodium 145 mmol/L    Potassium 3.6 mmol/L    Chloride 107 mmol/L    CO2 28 mmol/L    Glucose, Bld 145High mg/dL    BUN 16 mg/dL    Creatinine, Ser 8.67 mg/dL    Calcium 8.9 mg/dL    GFR, Estimated >61 mL/min    Anion gap 10  CBC [[id number]] Collected: 12/13/20 0449  Specimen: Blood Updated: 12/13/20 0522   WBC 8.4 K/uL    RBC 4.75 MIL/uL    Hemoglobin 14.3 g/dL    HCT 24.5 %    MCV 80.9 fL    MCH 30.1 pg    MCHC 34.8 g/dL    RDW 98.3 %    Platelets 209 K/uL    nRBC 0.0 %   Procalcitonin - Baseline [[id number]] Collected: 12/12/20 0843   Updated: 12/12/20 1105   Procalcitonin <0.10 ng/mL   Basic metabolic panel [[id number]] Collected: 12/12/20 0843  Specimen: Blood Updated: 12/12/20 0942   Sodium 142 mmol/L    Potassium 3.9 mmol/L    Chloride 106 mmol/L    CO2 27 mmol/L    Glucose, Bld 93 mg/dL    BUN 12 mg/dL    Creatinine, Ser 0.24 mg/dL    Calcium 9.0 mg/dL    GFR, Estimated >09 mL/min    Anion gap 9  Magnesium [[id number]] Collected: 12/12/20 0843  Specimen: Blood Updated: 12/12/20 0942   Magnesium 2.1 mg/dL   Lactic acid, plasma [[id number]] Collected: 12/12/20 0843  Specimen: Blood Updated: 12/12/20 0922   Lactic Acid, Venous 1.0 mmol/L   CBC [[id number]] Collected: 12/12/20 0843  Specimen: Blood Updated: 12/12/20 0902   WBC 9.5 K/uL    RBC 4.84 MIL/uL    Hemoglobin 14.4 g/dL    HCT 19.4 %    MCV 17.4 fL    MCH 29.8 pg    MCHC 34.2 g/dL    RDW 08.1 %    Platelets 215 K/uL    nRBC 0.0 %   HIV Antibody (routine testing w rflx) [[id number]] Collected: 12/11/20 0427  Specimen: Blood Updated: 12/11/20 1008   HIV Screen 4th Generation wRfx Non Reactive  Basic metabolic panel [[id number]] (Abnormal) Collected: 12/11/20 0427  Specimen: Blood Updated: 12/11/20 0550   Sodium 136 mmol/L    Potassium 3.0Low  mmol/L    Chloride 99 mmol/L    CO2 26 mmol/L    Glucose, Bld 84 mg/dL    BUN 14 mg/dL    Creatinine, Ser 5.88 mg/dL    Calcium 5.0YDX mg/dL    GFR, Estimated >41 mL/min    Anion gap 11  CBC [[id number]] (Abnormal) Collected: 12/11/20 0427  Specimen: Blood Updated: 12/11/20 0536   WBC 6.6 K/uL    RBC 4.28 MIL/uL    Hemoglobin 12.7Low g/dL    HCT 09.4BSJ %    MCV 86.7 fL    MCH 29.7 pg    MCHC 34.2 g/dL    RDW 62.8 %    Platelets 173 K/uL    nRBC 0.0 %   Urinalysis, Complete w Microscopic Urine, Clean Catch [[id number]] (Abnormal) Collected: 12/10/20 1700  Specimen: Urine, Clean Catch Updated: 12/10/20 1735   Color, Urine AMBERAbnormal   APPearance HAZYAbnormal   Specific Gravity, Urine 1.025   pH 5.0   Glucose, UA NEGATIVE mg/dL    Hgb urine dipstick NEGATIVE   Bilirubin Urine NEGATIVE   Ketones, ur  5Abnormal mg/dL    Protein, ur 456YBWLSLHT mg/dL    Nitrite NEGATIVE   Leukocytes,Ua NEGATIVE   RBC / HPF 0-5 RBC/hpf    WBC, UA 6-10 WBC/hpf    Bacteria, UA RAREAbnormal   Squamous Epithelial / LPF 0-5   Mucus PRESENT   Hyaline Casts, UA PRESENT  Resp Panel by RT-PCR (Flu A&B, Covid) Nasopharyngeal Swab [[id number]] Collected: 12/10/20 1504  Specimen: Nasopharyngeal(NP) swabs in vial transport medium from Nasopharyngeal Swab Updated: 12/10/20 1603   SARS Coronavirus 2 by RT PCR NEGATIVE   Influenza A by PCR NEGATIVE   Influenza B by PCR NEGATIVE

## 2020-12-13 NOTE — [place] FL2 (Signed)
[place] MEDICAID FL2 LEVEL OF CARE SCREENING TOOL     IDENTIFICATION  Patient Name: Richard Gillespie Birthdate: 1950/12/09 Sex: male Admission Date (Current Location): 12/10/2020  [facility] and [place] Number:  Chiropodist and Address:         Provider Number: [id number]  Attending Physician Name and Address:  Lewie Chamber, MD  Relative Name and Phone Number:       Current Level of Care: Hospital Recommended Level of Care: Other (Comment) (Group home) Prior Approval Number:    Date Approved/Denied:   PASRR Number:    Discharge Plan: Other (Comment) (Group Home)    Current Diagnoses: Patient Active Problem List   Diagnosis Date Noted  . Intellectual disability 12/13/2020  . Hypomagnesemia   . Hypotension   . Thrush   . Acute urinary retention   . Small bowel obstruction (HCC) 08/03/2019  . Malnutrition of moderate degree 09/17/2018  . SBO (small bowel obstruction) (HCC) 09/12/2018  . Aspiration pneumonia (HCC) 09/12/2018  . Seizure disorder (HCC) 09/12/2018  . Sepsis (HCC) 09/12/2018  . E coli infection 01/15/2017  . Agitation 01/15/2017  . Diarrhea 01/15/2017  . Hypokalemia 01/15/2017  . Hyponatremia 01/15/2017  . Leukocytosis 01/15/2017  . Hyperglycemia 01/15/2017  . UTI (urinary tract infection) 01/12/2017    Orientation RESPIRATION BLADDER Height & Weight     Self  Normal Incontinent Weight:   Height:     BEHAVIORAL SYMPTOMS/MOOD NEUROLOGICAL BOWEL NUTRITION STATUS      Incontinent Diet (Soft)  AMBULATORY STATUS COMMUNICATION OF NEEDS Skin   Limited Assist Verbally Normal                       Personal Care Assistance Level of Assistance              Functional Limitations Info             SPECIAL CARE FACTORS FREQUENCY                       Contractures Contractures Info: Not present    Additional Factors Info  Code Status,Allergies Code Status Info: DNR Allergies Info: Zithromax           Current  Medications (12/13/2020):  This is the current hospital active medication list Current Facility-Administered Medications  Medication Dose Route Frequency Provider Last Rate Last Admin  . acetaminophen (TYLENOL) tablet 650 mg  650 mg Oral Q6H PRN Cox, Amy N, DO       Or  . acetaminophen (TYLENOL) suppository 325 mg  325 mg Rectal Q6H PRN Cox, Amy N, DO      . bisacodyl (DULCOLAX) suppository 10 mg  10 mg Rectal PRN Cox, Amy N, DO      . carbamazepine (TEGRETOL XR) 12 hr tablet 200 mg  200 mg Oral BID Cox, Amy N, DO   200 mg at 12/13/20 1000  . heparin injection 5,000 Units  5,000 Units Subcutaneous Q8H Cox, Amy N, DO   5,000 Units at 12/12/20 2200  . lactulose (CHRONULAC) 10 GM/15ML solution 10 g  10 g Oral BID PRN Cox, Amy N, DO      . lamoTRIgine (LAMICTAL) tablet 150 mg  150 mg Oral BID Cox, Amy N, DO   150 mg at 12/13/20 1000  . levETIRAcetam (KEPPRA) tablet 500 mg  500 mg Oral BID Cox, Amy N, DO   500 mg at 12/13/20 1000  . magnesium citrate solution 1  Bottle  1 Bottle Oral Daily PRN Cox, Amy N, DO      . medroxyPROGESTERone (PROVERA) tablet 10 mg  10 mg Oral Daily Cox, Amy N, DO   10 mg at 12/13/20 1000  . ondansetron (ZOFRAN) tablet 4 mg  4 mg Oral Q6H PRN Cox, Amy N, DO       Or  . ondansetron (ZOFRAN) injection 4 mg  4 mg Intravenous Q6H PRN Cox, Amy N, DO      . polyethylene glycol (MIRALAX / GLYCOLAX) packet 17 g  17 g Oral Daily Cox, Amy N, DO   17 g at 12/13/20 1002  . QUEtiapine (SEROQUEL) tablet 12.5 mg  12.5 mg Oral BID Dow Adolph N, DO   12.5 mg at 12/13/20 1000  . tamsulosin (FLOMAX) capsule 0.4 mg  0.4 mg Oral Daily Cox, Amy N, DO   0.4 mg at 12/13/20 1000  . traZODone (DESYREL) tablet 300 mg  300 mg Oral QHS Cox, Amy N, DO   300 mg at 12/12/20 2158     Discharge Medications: Please see discharge summary for a list of discharge medications.  Relevant Imaging Results:  Relevant Lab Results:   Additional Information    Chapman Fitch, RN

## 2020-12-30 ENCOUNTER — Inpatient Hospital Stay
Admission: EM | Admit: 2020-12-30 | Discharge: 2021-01-24 | DRG: 388 | Disposition: E | Payer: Medicare Other | Attending: Internal Medicine | Admitting: Internal Medicine

## 2020-12-30 ENCOUNTER — Other Ambulatory Visit: Payer: Self-pay

## 2020-12-30 ENCOUNTER — Emergency Department: Payer: Medicare Other

## 2020-12-30 DIAGNOSIS — I10 Essential (primary) hypertension: Secondary | ICD-10-CM | POA: Diagnosis present

## 2020-12-30 DIAGNOSIS — D72829 Elevated white blood cell count, unspecified: Secondary | ICD-10-CM | POA: Diagnosis present

## 2020-12-30 DIAGNOSIS — R1110 Vomiting, unspecified: Secondary | ICD-10-CM

## 2020-12-30 DIAGNOSIS — F79 Unspecified intellectual disabilities: Secondary | ICD-10-CM | POA: Diagnosis not present

## 2020-12-30 DIAGNOSIS — D72828 Other elevated white blood cell count: Secondary | ICD-10-CM | POA: Diagnosis not present

## 2020-12-30 DIAGNOSIS — G40909 Epilepsy, unspecified, not intractable, without status epilepticus: Secondary | ICD-10-CM | POA: Diagnosis not present

## 2020-12-30 DIAGNOSIS — K5651 Intestinal adhesions [bands], with partial obstruction: Secondary | ICD-10-CM | POA: Diagnosis not present

## 2020-12-30 DIAGNOSIS — K565 Intestinal adhesions [bands], unspecified as to partial versus complete obstruction: Secondary | ICD-10-CM | POA: Diagnosis present

## 2020-12-30 DIAGNOSIS — K567 Ileus, unspecified: Secondary | ICD-10-CM | POA: Diagnosis present

## 2020-12-30 DIAGNOSIS — Z881 Allergy status to other antibiotic agents status: Secondary | ICD-10-CM

## 2020-12-30 DIAGNOSIS — K56609 Unspecified intestinal obstruction, unspecified as to partial versus complete obstruction: Secondary | ICD-10-CM | POA: Diagnosis not present

## 2020-12-30 DIAGNOSIS — R0682 Tachypnea, not elsewhere classified: Secondary | ICD-10-CM

## 2020-12-30 DIAGNOSIS — J9601 Acute respiratory failure with hypoxia: Secondary | ICD-10-CM

## 2020-12-30 DIAGNOSIS — Z66 Do not resuscitate: Secondary | ICD-10-CM | POA: Diagnosis present

## 2020-12-30 DIAGNOSIS — J690 Pneumonitis due to inhalation of food and vomit: Secondary | ICD-10-CM | POA: Diagnosis not present

## 2020-12-30 DIAGNOSIS — Z79899 Other long term (current) drug therapy: Secondary | ICD-10-CM

## 2020-12-30 DIAGNOSIS — E876 Hypokalemia: Secondary | ICD-10-CM | POA: Diagnosis not present

## 2020-12-30 DIAGNOSIS — Z515 Encounter for palliative care: Secondary | ICD-10-CM

## 2020-12-30 DIAGNOSIS — F72 Severe intellectual disabilities: Secondary | ICD-10-CM | POA: Diagnosis present

## 2020-12-30 DIAGNOSIS — Z0189 Encounter for other specified special examinations: Secondary | ICD-10-CM

## 2020-12-30 DIAGNOSIS — Z20822 Contact with and (suspected) exposure to covid-19: Secondary | ICD-10-CM | POA: Diagnosis present

## 2020-12-30 LAB — COMPREHENSIVE METABOLIC PANEL
ALT: 23 U/L (ref 0–44)
AST: 29 U/L (ref 15–41)
Albumin: 4.8 g/dL (ref 3.5–5.0)
Alkaline Phosphatase: 53 U/L (ref 38–126)
Anion gap: 14 (ref 5–15)
BUN: 17 mg/dL (ref 8–23)
CO2: 22 mmol/L (ref 22–32)
Calcium: 9.3 mg/dL (ref 8.9–10.3)
Chloride: 102 mmol/L (ref 98–111)
Creatinine, Ser: 0.98 mg/dL (ref 0.61–1.24)
GFR, Estimated: >60 mL/min (ref >60)
Glucose, Bld: 145 mg/dL — ABNORMAL HIGH (ref 70–99)
Potassium: 3.9 mmol/L (ref 3.5–5.1)
Sodium: 138 mmol/L (ref 135–145)
Total Bilirubin: 0.9 mg/dL (ref 0.3–1.2)
Total Protein: 8.2 g/dL — ABNORMAL HIGH (ref 6.5–8.1)

## 2020-12-30 LAB — CBC
HCT: 42.3 % (ref 39.0–52.0)
Hemoglobin: 14.5 g/dL (ref 13.0–17.0)
MCH: 29.7 pg (ref 26.0–34.0)
MCHC: 34.3 g/dL (ref 30.0–36.0)
MCV: 86.5 fL (ref 80.0–100.0)
Platelets: 315 10*3/uL (ref 150–400)
RBC: 4.89 MIL/uL (ref 4.22–5.81)
RDW: 12.4 % (ref 11.5–15.5)
WBC: 14.1 10*3/uL — ABNORMAL HIGH (ref 4.0–10.5)
nRBC: 0 % (ref 0.0–0.2)

## 2020-12-30 LAB — LIPASE, BLOOD: Lipase: 25 U/L (ref 11–51)

## 2020-12-30 MED ORDER — MAGNESIUM CITRATE PO SOLN: 1.0000 | Freq: Every day | ORAL | Status: DC | PRN

## 2020-12-30 MED ORDER — ONDANSETRON HCL 4 MG/2ML IJ SOLN
4.0000 mg | Freq: Four times a day (QID) | INTRAMUSCULAR | Status: DC | PRN
  Administered 2020-12-31 – 2021-01-03 (×6): 4 mg via INTRAVENOUS
  Filled 2020-12-30 (×6): qty 2

## 2020-12-30 MED ORDER — ENOXAPARIN SODIUM 40 MG/0.4ML ~~LOC~~ SOLN
40.0000 mg | SUBCUTANEOUS | Status: DC
  Administered 2020-12-31 – 2021-01-01 (×2): 40 mg via SUBCUTANEOUS
  Filled 2020-12-30 (×3): qty 0.4

## 2020-12-30 MED ORDER — BISACODYL 10 MG RE SUPP
10.0000 mg | RECTAL | Status: DC | PRN
  Filled 2020-12-30: qty 1

## 2020-12-30 MED ORDER — POLYETHYLENE GLYCOL 3350 17 G PO PACK
17.0000 g | PACK | Freq: Every day | ORAL | Status: DC
  Filled 2020-12-30: qty 1

## 2020-12-30 MED ORDER — LEVETIRACETAM 500 MG PO TABS
500.0000 mg | ORAL_TABLET | Freq: Two times a day (BID) | ORAL | Status: DC
  Administered 2020-12-31: 500 mg via ORAL
  Filled 2020-12-30 (×3): qty 1

## 2020-12-30 MED ORDER — SODIUM CHLORIDE 0.45 % IV SOLN: INTRAVENOUS | Status: DC

## 2020-12-30 MED ORDER — ACETAMINOPHEN 650 MG RE SUPP
650.0000 mg | Freq: Four times a day (QID) | RECTAL | Status: DC | PRN
  Administered 2021-01-01: 650 mg via RECTAL
  Filled 2020-12-30: qty 1

## 2020-12-30 MED ORDER — HALOPERIDOL 0.5 MG PO TABS
0.5000 mg | ORAL_TABLET | Freq: Four times a day (QID) | ORAL | Status: DC | PRN
  Filled 2020-12-30: qty 1

## 2020-12-30 MED ORDER — TAMSULOSIN HCL 0.4 MG PO CAPS
0.4000 mg | ORAL_CAPSULE | Freq: Every day | ORAL | Status: DC
Start: 2020-12-31 — End: 2021-01-03
  Filled 2020-12-30: qty 1

## 2020-12-30 MED ORDER — LAMOTRIGINE 25 MG PO TABS
150.0000 mg | ORAL_TABLET | Freq: Two times a day (BID) | ORAL | Status: DC
  Administered 2020-12-31: 150 mg via ORAL
  Filled 2020-12-30 (×2): qty 1

## 2020-12-30 MED ORDER — SODIUM CHLORIDE 0.9 % IV SOLN: Freq: Once | INTRAVENOUS | Status: DC

## 2020-12-30 MED ORDER — ONDANSETRON HCL 4 MG PO TABS: 4.0000 mg | ORAL_TABLET | Freq: Four times a day (QID) | ORAL | Status: DC | PRN

## 2020-12-30 MED ORDER — CLONIDINE HCL 0.1 MG/24HR TD PTWK: 0.1000 mg | MEDICATED_PATCH | TRANSDERMAL | Status: DC

## 2020-12-30 MED ORDER — LACTULOSE 10 GM/15ML PO SOLN: 10.0000 g | Freq: Two times a day (BID) | ORAL | Status: DC | PRN

## 2020-12-30 MED ORDER — CARBAMAZEPINE ER 200 MG PO TB12
200.0000 mg | ORAL_TABLET | Freq: Two times a day (BID) | ORAL | Status: DC
  Administered 2020-12-31: 200 mg via ORAL
  Filled 2020-12-30 (×9): qty 1

## 2020-12-30 MED ORDER — ACETAMINOPHEN 325 MG PO TABS: 650.0000 mg | ORAL_TABLET | Freq: Four times a day (QID) | ORAL | Status: DC | PRN

## 2020-12-30 MED ORDER — TRAZODONE HCL 100 MG PO TABS
300.0000 mg | ORAL_TABLET | Freq: Every day | ORAL | Status: DC
  Administered 2020-12-31: 300 mg via ORAL
  Filled 2020-12-30: qty 3

## 2020-12-30 MED ORDER — MEDROXYPROGESTERONE ACETATE 10 MG PO TABS
10.0000 mg | ORAL_TABLET | Freq: Every day | ORAL | Status: DC
  Filled 2020-12-30 (×4): qty 1

## 2020-12-30 MED ORDER — HALOPERIDOL LACTATE 5 MG/ML IJ SOLN
1.0000 mg | Freq: Four times a day (QID) | INTRAMUSCULAR | Status: DC | PRN
  Administered 2020-12-31 – 2021-01-03 (×2): 1 mg via INTRAMUSCULAR
  Filled 2020-12-30 (×3): qty 1

## 2020-12-30 NOTE — H&P (Signed)
History and Physical    Richard Gillespie HKV:[id number] DOB: [birth year] DOA: 01/19/2021  PCP: Gracelyn Nurse, MD (Confirm with patient/family/NH records and if not entered, this has to be entered at [facility] point of entry) Patient coming from: home  I have personally briefly reviewed patient's old medical records in [facility]  Chief Complaint: vomiting  HPI: Richard Gillespie is a 70 y.o. male with medical history significant of hypertension, seizures, severe intellectual disability, history of small bowel obstructions status post laparotomy and lysis of adhesion. He presents to the emergency department today accompanied by his care giver because of vomiting. He had an episode last night and then multiple episodes today. Has not appeared to have been in any pain and no blood noted in the emesis.  Caveate: cognitive impairment - history from caregiver. Patient has a history of similar symptoms in the past and was recently in the hospital  12/10/20 for similar symptoms which resolved with hydration and bowel rest with no surgical intervention. He has had other previous admissions for similar presentations. . No fevers noted.    ED Course: T 98.9  126/60  HR 76  RR 18. EDP exam notable for mild abdominal tenderness. Cmet with glucose of 145, WBC 14.1, Hgb 14.5. Acute abdominal series c/w obstructive pattern with dilated loops of bowel and stomach with fluid level. TRH consulted to bring in to hospital for conservative management of SBO vs ileus.  Review of Systems: As per HPI otherwise 10 point review of systems negative.    Past Medical History:  Diagnosis Date  . Mental retardation   . Seizures (HCC)     Past Surgical History:  Procedure Laterality Date  . COLON SURGERY    . LAPAROTOMY N/A 08/05/2019   Procedure: EXPLORATORY LAPAROTOMY;  Surgeon: Carolan Shiver, MD;  Location: [facility] ORS;  Service: General;  Laterality: N/A;    Soc Hx - patient cannot give social history. Lives in group  home - no caregiver present at my exam.    reports that he has never smoked. He has never used smokeless tobacco. He reports that he does not drink alcohol and does not use drugs.  Allergies  Allergen Reactions  . Zithromax [Azithromycin] Other (See Comments)    unknown    No family history on file.   Prior to Admission medications   Medication Sig Start Date End Date Taking? Authorizing Provider  bisacodyl (DULCOLAX) 10 MG suppository Place 10 mg rectally as needed for moderate constipation.    [provider]  carbamazepine (TEGRETOL XR) 200 MG 12 hr tablet Take 1 tablet (200 mg total) by mouth 2 (two) times daily. 05/10/20   Alford Highland, MD  cloNIDine (CATAPRES - DOSED IN MG/24 HR) 0.1 mg/24hr patch Place 0.1 mg onto the skin every Friday.    [provider]  lactulose (CHRONULAC) 10 GM/15ML solution Take 10 g by mouth 2 (two) times daily as needed for mild constipation.     [provider]  lamoTRIgine (LAMICTAL) 150 MG tablet Take 1 tablet (150 mg total) by mouth 2 (two) times daily. 05/10/20   Alford Highland, MD  levETIRAcetam (KEPPRA) 500 MG tablet Take 1 tablet (500 mg total) by mouth 2 (two) times daily. 05/10/20   Alford Highland, MD  loratadine (CLARITIN) 10 MG tablet Take 10 mg by mouth daily.    [provider]  magnesium citrate SOLN Take 1 Bottle by mouth daily as needed for severe constipation (at least 2 days of  no BM).     [provider]  medroxyPROGESTERone (PROVERA) 10 MG tablet Take 10 mg by mouth daily.    [provider]  Multiple Vitamin (THEREMS) TABS Take 1 tablet by mouth daily.    [provider]  polyethylene glycol (MIRALAX / GLYCOLAX) 17 g packet Take 17 g by mouth daily. 05/10/20   Alford Highland, MD  senna (SENOKOT) 8.6 MG TABS tablet Take 2 tablets by mouth 2 (two) times daily.     [provider]  tamsulosin (FLOMAX) 0.4 MG CAPS capsule Take 1 capsule (0.4 mg total) by mouth  daily. 05/10/20   Alford Highland, MD  traZODone (DESYREL) 150 MG tablet Take 300 mg by mouth at bedtime.    [provider]    Physical Exam: Vitals:   01/07/2021 1650 01/14/2021 1920 12/29/2020 2121 01/08/2021 2247  BP:   112/65 126/60  Pulse:  91 78 76  Resp:  18 18 18   Temp:      TempSrc:      SpO2:  98% 98% 98%  Weight: 59 kg     Height: 5\' 4"  (1.626 m)        Vitals:   01/08/2021 1650 01/03/2021 1920 01/01/2021 2121 01/22/2021 2247  BP:   112/65 126/60  Pulse:  91 78 76  Resp:  18 18 18   Temp:      TempSrc:      SpO2:  98% 98% 98%  Weight: 59 kg     Height: 5\' 4"  (1.626 m)      General: Very thin older man, mildly agitated - calms easily. Eyes: PERRL, lids and conjunctivae normal ENMT: Mucous membranes are dry. Neck: normal, supple, no masses, no thyromegaly Respiratory: clear to auscultation bilaterally, no wheezing, no crackles. Normal respiratory effort. No accessory muscle use.  Cardiovascular: Regular rate and rhythm, no murmurs / rubs / gallops. No extremity edema. 2+ pedal pulses. No carotid bruits.  Abdomen: no tenderness to deep palpation, no masses palpated, no appreciable distention, no guarding. Scattered hypoactive bowel sounds.  Musculoskeletal: no clubbing / cyanosis. No joint deformity  lower extremities. Mild deformity - spooning hands/fingers. Good ROM, no contractures. Decreased muscle tone.  Skin: no rashes, lesions, ulcers. No induration. Bruise noted left arm - appears old Neurologic: CN 2-12 grossly intact. Strength 5/5 in all 4.  Psychiatric: Severe cognitive impairment, non-verbal. Appears mildly agitated-strange surroundings.     Labs on Admission: I have personally reviewed following labs and imaging studies  CBC: Recent Labs  Lab 12/31/2020 1655  WBC 14.1*  HGB 14.5  HCT 42.3  MCV 86.5  PLT 315   Basic Metabolic Panel: Recent Labs  Lab 01/09/2021 1655  NA 138  K 3.9  CL 102  CO2 22  GLUCOSE 145*  BUN 17  CREATININE 0.98  CALCIUM  9.3   GFR: Estimated Creatinine Clearance: 59.4 mL/min (by C-G formula based on SCr of 0.98 mg/dL). Liver Function Tests: Recent Labs  Lab 01/15/2021 1655  AST 29  ALT 23  ALKPHOS 53  BILITOT 0.9  PROT 8.2*  ALBUMIN 4.8   Recent Labs  Lab 01/02/2021 1655  LIPASE 25   No results for input(s): AMMONIA in the last 168 hours. Coagulation Profile: No results for input(s): INR, PROTIME in the last 168 hours. Cardiac Enzymes: No results for input(s): CKTOTAL, CKMB, CKMBINDEX, TROPONINI in the last 168 hours. BNP (last 3 results) No results for input(s): PROBNP in the last 8760 hours. HbA1C: No results for input(s): HGBA1C  in the last 72 hours. CBG: No results for input(s): GLUCAP in the last 168 hours. Lipid Profile: No results for input(s): CHOL, HDL, LDLCALC, TRIG, CHOLHDL, LDLDIRECT in the last 72 hours. Thyroid Function Tests: No results for input(s): TSH, T4TOTAL, FREET4, T3FREE, THYROIDAB in the last 72 hours. Anemia Panel: No results for input(s): VITAMINB12, FOLATE, FERRITIN, TIBC, IRON, RETICCTPCT in the last 72 hours. Urine analysis:    Component Value Date/Time   COLORURINE AMBER (A) 12/10/2020 1700   APPEARANCEUR HAZY (A) 12/10/2020 1700   LABSPEC 1.025 12/10/2020 1700   PHURINE 5.0 12/10/2020 1700   GLUCOSEU NEGATIVE 12/10/2020 1700   HGBUR NEGATIVE 12/10/2020 1700   BILIRUBINUR NEGATIVE 12/10/2020 1700   KETONESUR 5 (A) 12/10/2020 1700   PROTEINUR 100 (A) 12/10/2020 1700   NITRITE NEGATIVE 12/10/2020 1700   LEUKOCYTESUR NEGATIVE 12/10/2020 1700    Radiological Exams on Admission: No results found.  EKG: Independently reviewed. No EKG done in ED  Assessment/Plan Active Problems:   SBO (small bowel obstruction) (HCC)   Leukocytosis   Seizure disorder (HCC)   Intellectual disability   Small bowel obstruction due to adhesions (HCC)     1. GI - More likely ileus given positive bowel sounds, lack of distention although radiographically may be partial  obstruction. Has h/o laparotomy and adhesions. Non-acute abdomen. Plan IV hydration  NPO except ice chips, sips with meds  Zofran q6 prn N/V  For refractory Vomiting - NG to low suction  F/u single view abdominal film in AM  Advance diet starting in AM  2. Leukocytosis - no fever, no evidence of infection on exam. U/A pending Plan No indication for Abx at this time  F/u CBCD in AM  3. Seizure disorder - per previous hospital records - no seizure for several years. Plan Continue home medications.  4. Intellectual disability - long term problem. He is mildly agitated. Plan Continue psychotropic medications  For agitation or need for sedation for treatment, e.g. starting IV, Haldol PO or IM  5. Disposition - has been residing in a group home. Likely to return when stable  DVT prophylaxis: lovenox  Code Status: DNR - per previous admissions.  Family Communication: spoke with Brother Denton Ar. Explained we may need to sedate for IV start or central line if necessary. He gave his permission and is grateful for the care being provided.   Disposition Plan: return to group home when stable  Consults called: none  Admission status: Obs    Illene Regulus MD [facility] Pager [phone]  If 7PM-7AM, please contact night-coverage [email or web address] Password [facility]  2021/01/27, 11:25 PM

## 2020-12-30 NOTE — ED Triage Notes (Signed)
Pt arrives pov with his caregiver, pt was seen a couple weeks ago with a bowel obstruction, pt started with a decreased appetite yesterday and has vomited 3 times today, pt is unable to state pain due to his cognitive abilities.

## 2020-12-30 NOTE — ED Provider Notes (Signed)
[facility] Emergency Department Provider Note   ____________________________________________   I have reviewed the triage vital signs and the nursing notes.   HISTORY  Chief Complaint Emesis   History limited by and level 5 due to: Cognitive impairment, history obtained from caregiver at bedside  HPI Richard Gillespie is a 70 y.o. male who presents to the emergency department today accompanied by his care giver because of vomiting. He had an episode last night and then multiple episodes today. Has not appeared to have been in any pain and no blood noted in the emesis. Patient has a history of similar symptoms in the past and was recently in the hospital for similar symptoms. No fevers noted.   Records reviewed. Per medical record review patient has a history of recent admission to the hospital for ileus that improved with slow dietary changes.   Past Medical History:  Diagnosis Date  . Mental retardation   . Seizures [facility])     Patient Active Problem List   Diagnosis Date Noted  . Intellectual disability 12/13/2020  . Hypomagnesemia   . Hypotension   . Thrush   . Acute urinary retention   . Small bowel obstruction (HCC) 08/03/2019  . Malnutrition of moderate degree 09/17/2018  . SBO (small bowel obstruction) (HCC) 09/12/2018  . Aspiration pneumonia (HCC) 09/12/2018  . Seizure disorder (HCC) 09/12/2018  . Sepsis (HCC) 09/12/2018  . E coli infection 01/15/2017  . Agitation 01/15/2017  . Diarrhea 01/15/2017  . Hypokalemia 01/15/2017  . Hyponatremia 01/15/2017  . Leukocytosis 01/15/2017  . Hyperglycemia 01/15/2017  . UTI (urinary tract infection) 01/12/2017    Past Surgical History:  Procedure Laterality Date  . COLON SURGERY    . LAPAROTOMY N/A 08/05/2019   Procedure: EXPLORATORY LAPAROTOMY;  Surgeon: Carolan Shiver, MD;  Location: [facility] ORS;  Service: General;  Laterality: N/A;    Prior to Admission medications   Medication Sig Start Date  End Date Taking? Authorizing Provider  bisacodyl (DULCOLAX) 10 MG suppository Place 10 mg rectally as needed for moderate constipation.    [provider]  carbamazepine (TEGRETOL XR) 200 MG 12 hr tablet Take 1 tablet (200 mg total) by mouth 2 (two) times daily. 05/10/20   Alford Highland, MD  cloNIDine (CATAPRES - DOSED IN MG/24 HR) 0.1 mg/24hr patch Place 0.1 mg onto the skin every Friday.    [provider]  lactulose (CHRONULAC) 10 GM/15ML solution Take 10 g by mouth 2 (two) times daily as needed for mild constipation.     [provider]  lamoTRIgine (LAMICTAL) 150 MG tablet Take 1 tablet (150 mg total) by mouth 2 (two) times daily. 05/10/20   Alford Highland, MD  levETIRAcetam (KEPPRA) 500 MG tablet Take 1 tablet (500 mg total) by mouth 2 (two) times daily. 05/10/20   Alford Highland, MD  loratadine (CLARITIN) 10 MG tablet Take 10 mg by mouth daily.    [provider]  magnesium citrate SOLN Take 1 Bottle by mouth daily as needed for severe constipation (at least 2 days of no BM).     [provider]  medroxyPROGESTERone (PROVERA) 10 MG tablet Take 10 mg by mouth daily.    [provider]  Multiple Vitamin (THEREMS) TABS Take 1 tablet by mouth daily.    [provider]  polyethylene glycol (MIRALAX / GLYCOLAX) 17 g packet Take 17 g by mouth daily. 05/10/20   Alford Highland, MD  senna (SENOKOT) 8.6 MG TABS tablet Take 2 tablets by  mouth 2 (two) times daily.     [provider]  tamsulosin (FLOMAX) 0.4 MG CAPS capsule Take 1 capsule (0.4 mg total) by mouth daily. 05/10/20   Alford Highland, MD  traZODone (DESYREL) 150 MG tablet Take 300 mg by mouth at bedtime.    [provider]    Allergies Zithromax [azithromycin]  No family history on file.  Social History Social History   Tobacco Use  . Smoking status: Never Smoker  . Smokeless tobacco: Never Used  Substance Use Topics  . Alcohol use: No  . Drug  use: Never    Review of Systems Unable to obtain secondary to cognitive disability. ____________________________________________   PHYSICAL EXAM:  VITAL SIGNS: ED Triage Vitals  Enc Vitals Group     BP 2021/01/28 1647 129/87     Pulse Rate 01-28-21 1647 (!) 112     Resp 01-28-21 1647 20     Temp 01/28/2021 1647 98.9 F (37.2 C)     Temp Source 01/28/2021 1647 Oral     SpO2 01-28-21 1647 96 %     Weight 01/28/21 1650 130 lb (59 kg)     Height 01-28-2021 1650 5\' 4"  (1.626 m)     Head Circumference --      Peak Flow --      Pain Score 01/28/21 1648 0   Constitutional: Awake and alert. Eyes: Conjunctivae are normal.  ENT      Head: Normocephalic and atraumatic.      Nose: No congestion/rhinnorhea.      Mouth/Throat: Mucous membranes are moist.      Neck: No stridor. Hematological/Lymphatic/Immunilogical: No cervical lymphadenopathy. Cardiovascular: Normal rate, regular rhythm.  No murmurs, rubs, or gallops.  Respiratory: Normal respiratory effort without tachypnea nor retractions. Breath sounds are clear and equal bilaterally. No wheezes/rales/rhonchi. Gastrointestinal: Soft, slightly tender and distended.  Genitourinary: Deferred Musculoskeletal: Normal range of motion in all extremities. Neurologic:  Cognitive impairment. Moving all extremities.  Skin:  Skin is warm, dry and intact. No rash noted.  ____________________________________________    LABS (pertinent positives/negatives)  Lipase 25 CBC wbc 14.1, hgb 14.5, plt 315 CMP wnl except glu 145, t pro 8.2  ____________________________________________   EKG  None  ____________________________________________    RADIOLOGY  Abx x-ray Recurrent air gas patter concerning for recurrent ileus vs obstruction  I, 02/27/21, personally viewed and evaluated these images (plain radiographs) as part of my medical decision  making. ____________________________________________   PROCEDURES  Procedures  ____________________________________________   INITIAL IMPRESSION / ASSESSMENT AND PLAN / ED COURSE  Pertinent labs & imaging results that were available during my care of the patient were reviewed by me and considered in my medical decision making (see chart for details).   Patient presents to the emergency department today because of concerns for nausea and vomiting.  On exam patient does have some slight abdominal tenderness and what appears to be some distention.  Abdominal x-rays are concerning for recurrent ileus versus obstruction.  Per chart review it appears that this was managed medically last month.  Will plan on admission to the hospitalist service.  Patient is not actively vomiting at this time so will hold off on NG tube.  ____________________________________________   FINAL CLINICAL IMPRESSION(S) / ED DIAGNOSES  Final diagnoses:  Vomiting, intractability of vomiting not specified, presence of nausea not specified, unspecified vomiting type     Note: This dictation was prepared with Dragon dictation. Any transcriptional errors that result from this process are unintentional  Phineas Semen, MD 01/23/2021 [id number]

## 2020-12-31 ENCOUNTER — Inpatient Hospital Stay: Payer: Medicare Other

## 2020-12-31 DIAGNOSIS — J9601 Acute respiratory failure with hypoxia: Secondary | ICD-10-CM | POA: Diagnosis not present

## 2020-12-31 DIAGNOSIS — K5651 Intestinal adhesions [bands], with partial obstruction: Secondary | ICD-10-CM | POA: Diagnosis present

## 2020-12-31 DIAGNOSIS — Z515 Encounter for palliative care: Secondary | ICD-10-CM | POA: Diagnosis not present

## 2020-12-31 DIAGNOSIS — F79 Unspecified intellectual disabilities: Secondary | ICD-10-CM | POA: Diagnosis not present

## 2020-12-31 DIAGNOSIS — K565 Intestinal adhesions [bands], unspecified as to partial versus complete obstruction: Secondary | ICD-10-CM | POA: Diagnosis not present

## 2020-12-31 DIAGNOSIS — D72829 Elevated white blood cell count, unspecified: Secondary | ICD-10-CM | POA: Diagnosis present

## 2020-12-31 DIAGNOSIS — F72 Severe intellectual disabilities: Secondary | ICD-10-CM

## 2020-12-31 DIAGNOSIS — Z881 Allergy status to other antibiotic agents status: Secondary | ICD-10-CM | POA: Diagnosis not present

## 2020-12-31 DIAGNOSIS — I10 Essential (primary) hypertension: Secondary | ICD-10-CM | POA: Diagnosis present

## 2020-12-31 DIAGNOSIS — K56609 Unspecified intestinal obstruction, unspecified as to partial versus complete obstruction: Secondary | ICD-10-CM | POA: Diagnosis not present

## 2020-12-31 DIAGNOSIS — R1111 Vomiting without nausea: Secondary | ICD-10-CM | POA: Diagnosis not present

## 2020-12-31 DIAGNOSIS — R933 Abnormal findings on diagnostic imaging of other parts of digestive tract: Secondary | ICD-10-CM

## 2020-12-31 DIAGNOSIS — K6389 Other specified diseases of intestine: Secondary | ICD-10-CM

## 2020-12-31 DIAGNOSIS — Z20822 Contact with and (suspected) exposure to covid-19: Secondary | ICD-10-CM | POA: Diagnosis present

## 2020-12-31 DIAGNOSIS — Z66 Do not resuscitate: Secondary | ICD-10-CM | POA: Diagnosis present

## 2020-12-31 DIAGNOSIS — G40909 Epilepsy, unspecified, not intractable, without status epilepticus: Secondary | ICD-10-CM | POA: Diagnosis present

## 2020-12-31 DIAGNOSIS — Z79899 Other long term (current) drug therapy: Secondary | ICD-10-CM | POA: Diagnosis not present

## 2020-12-31 DIAGNOSIS — K567 Ileus, unspecified: Secondary | ICD-10-CM | POA: Diagnosis present

## 2020-12-31 DIAGNOSIS — J690 Pneumonitis due to inhalation of food and vomit: Secondary | ICD-10-CM | POA: Diagnosis not present

## 2020-12-31 DIAGNOSIS — E876 Hypokalemia: Secondary | ICD-10-CM | POA: Diagnosis not present

## 2020-12-31 LAB — CBC WITH DIFFERENTIAL/PLATELET
Abs Immature Granulocytes: 0.04 10*3/uL (ref 0.00–0.07)
Basophils Absolute: 0 10*3/uL (ref 0.0–0.1)
Basophils Relative: 0 %
Eosinophils Absolute: 0 10*3/uL (ref 0.0–0.5)
Eosinophils Relative: 0 %
HCT: 45 % (ref 39.0–52.0)
Hemoglobin: 15 g/dL (ref 13.0–17.0)
Immature Granulocytes: 0 %
Lymphocytes Relative: 8 %
Lymphs Abs: 1.2 10*3/uL (ref 0.7–4.0)
MCH: 29.4 pg (ref 26.0–34.0)
MCHC: 33.3 g/dL (ref 30.0–36.0)
MCV: 88.2 fL (ref 80.0–100.0)
Monocytes Absolute: 1 10*3/uL (ref 0.1–1.0)
Monocytes Relative: 7 %
Neutro Abs: 12.3 10*3/uL — ABNORMAL HIGH (ref 1.7–7.7)
Neutrophils Relative %: 85 %
Platelets: 353 10*3/uL (ref 150–400)
RBC: 5.1 MIL/uL (ref 4.22–5.81)
RDW: 12.4 % (ref 11.5–15.5)
WBC: 14.6 10*3/uL — ABNORMAL HIGH (ref 4.0–10.5)
nRBC: 0 % (ref 0.0–0.2)

## 2020-12-31 LAB — SARS CORONAVIRUS 2 (TAT 6-24 HRS): SARS Coronavirus 2: NEGATIVE

## 2020-12-31 NOTE — Consult Note (Signed)
Reason for Consult: Vomiting, ?SBO Consulting MD:  Richard Gillespie is an 70 y.o. male.  HPI: He was brought to the emergency department by his caregivers for vomiting.  He is known to the surgical service for his last admission in January 2022 for questionable small bowel obstruction that resolved without surgical intervention.  He was also operated on by Dr. Maia Plan in September 2020 for bowel obstruction and underwent extensive adhesiolysis at that time.  History is obtained entirely from the electronic medical record, as the patient is unable to provide it secondary to his severe mental retardation.  No caregiver is available at bedside.  Past Medical History:  Diagnosis Date  . Mental retardation   . Seizures (HCC)     Past Surgical History:  Procedure Laterality Date  . COLON SURGERY    . LAPAROTOMY N/A 08/05/2019   Procedure: EXPLORATORY LAPAROTOMY;  Surgeon: Carolan Shiver, MD;  Location: [facility] ORS;  Service: General;  Laterality: N/A;    History reviewed. No pertinent family history.  Social History:  reports that he has never smoked. He has never used smokeless tobacco. He reports that he does not drink alcohol and does not use drugs.  Allergies:  Allergies  Allergen Reactions  . Zithromax [Azithromycin] Other (See Comments)    unknown    Medications: I have reviewed the patient's current medications. Review of Systems  Unable to perform ROS: Other  Mental retardation   Physical Exam Constitutional:      General: He is not in acute distress, agitated, but seems to calm and respond to command.   Cardiovascular:     Rate and Rhythm: Normal rate and regular rhythm.  Pulmonary:     Effort: Pulmonary effort is normal. No respiratory distress.  Abdominal:     General: Abdomen is flat.     Palpations: Abdomen is soft.   Neurological:     Mental Status: He is alert. Mental status is at baseline.      Assessment/Plan: This is a 70 year old man  with significant developmental delay who was brought to the emergency department again due to vomiting.   Looking back through his chart over several years, his abdominal plain films always show the same pattern, even when not at the hospital for possible bowel-related issues.  Gas distended colon and stomach with left hemidiaphragm elevation has been reported on his plain films since 2001.  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  He does have a prior history of bowel obstruction, treated with adhesiolysis, but at this time, I think nasoenteric decompression is not feasible.    Campbell Lerner, M.D., [facility]  12/31/2020 ; 2:04 PM

## 2020-12-31 NOTE — ED Notes (Signed)
Pt keeps trying to get out of bed and this RN keeps redirecting him to scoot back and stay in bed. Pt was given urinal to urinate. Pt still keeps saying something that this RN cannot understand.

## 2020-12-31 NOTE — ED Notes (Signed)
Dr. Debby Bud and Anna Genre NP are both made aware that this pt is special needs and is unable to take PO medication due to mental status. Pt has repeatedly refused water, medications and food to be taken with medications.

## 2020-12-31 NOTE — ED Notes (Signed)
Due to not being able to understand pt due to cognitive issues, pt was given PRN Zofran in case pt was nauseous and does not now how to tell this RN. Bed alarm in place.

## 2020-12-31 NOTE — Progress Notes (Signed)
PROGRESS NOTE    Richard Gillespie  JJO:[id number] DOB: 02/29/1951 DOA: 01/11/2021 PCP: Gracelyn Nurse, MD    Brief Narrative:  This 70 years old male with PMH significant for hypertension, seizure disorder, severe intellectual disability, history of small bowel obstruction s/p laparotomy and adhesion lysis presents in the emergency department accompanied by his caregiver because of vomiting.  He had an episode last night and then had multiple episodes .  Patient has history of similar symptoms in the past and was recently seen in the hospital on 12/10/2020 for similar symptoms which resolved with hydration and bowel rest with no surgical intervention.  X-ray abdomen consistent with obstructive pattern with dilated loops of small bowel in the stomach with fluid level.  Patient is admitted for partial small bowel obstruction.  General surgery was consulted.   Assessment & Plan:   Active Problems:   Leukocytosis   SBO (small bowel obstruction) (HCC)   Seizure disorder (HCC)   Intellectual disability   Small bowel obstruction due to adhesions (HCC)   Partial small bowel obstruction: Patient with history of laparotomy and adhesion lysis. Presented in the emergency department with persistent nausea and vomiting. X-ray abdomen consistent with obstructive pattern with dilated loops of small bowel. Continue NPO except ice chips, continue IV hydration. Continue Zofran every 6 hours as needed for nausea and vomiting Not feasible to have NG tube. General surgery consulted, likely has some degree of chronic GI dysmotility sec. to chronic illness and polypharmacy. Continue conservative management.  Leukocytosis -  No fever, no evidence of infection on exam. U/A Negative No indication for Abx at this time F/u CBCD in AM  Seizure disorder - per previous hospital records - no seizure for several years. Continue home medications.  Intellectual disability -  long term problem. He is mildly  agitated. Continue psychotropic medications For agitation or need for sedation for treatment, e.g. starting IV, Haldol PO or IM  Disposition - has been residing in a group home. Likely to return when stable    DVT prophylaxis: Lovenox. Code Status: DNR Family Communication: No family at bed side. Disposition Plan:  Status is: Inpatient  Remains inpatient appropriate because:Inpatient level of care appropriate due to severity of illness   Dispo: The patient is from: Group home              Anticipated d/c is to: Group home              Anticipated d/c date is: 3 days              Patient currently is not medically stable to d/c.   Difficult to place patient No   Consultants:   General surgery  Procedures: None Antimicrobials:   Anti-infectives (From admission, onward)   None     Subjective: Patient was seen and examined at bedside.  Patient was restless and agitated, He was given Haldol. he is under the effect of medication, lying on the side.  Objective: Vitals:   12/31/20 0343 12/31/20 0704 12/31/20 1047 12/31/20 1420  BP: 91/61 (!) 132/112 127/61 101/79  Pulse: 74 80 100 80  Resp: 18 20 20 18   Temp:    98.3 F (36.8 C)  TempSrc:    Oral  SpO2: 93% 94% 93% 98%  Weight:      Height:       No intake or output data in the 24 hours ending 12/31/20 1606 Filed Weights   01/02/2021 1650  Weight: 59 kg  Examination:  General exam: Appears mildly agitated, restless.Marland Kitchen Respiratory system: Clear to auscultation. Respiratory effort normal. Cardiovascular system: S1 & S2 heard, RRR. No JVD, murmurs, rubs, gallops or clicks. No pedal edema. Gastrointestinal system: Abdomen is nondistended, soft and nontender. No organomegaly or masses felt. Normal bowel sounds heard. Central nervous system: Alert and oriented. No focal neurological deficits. Extremities: Not assessed, no edema no cyanosis, no clubbing Skin: No rashes, lesions or ulcers Psychiatry: Severe  cognitive impairment, nonverbal.  Data Reviewed: I have personally reviewed following labs and imaging studies  CBC: Recent Labs  Lab 12/27/2020 1655 12/31/20 1227  WBC 14.1* 14.6*  NEUTROABS  --  12.3*  HGB 14.5 15.0  HCT 42.3 45.0  MCV 86.5 88.2  PLT 315 353   Basic Metabolic Panel: Recent Labs  Lab 01/17/2021 1655  NA 138  K 3.9  CL 102  CO2 22  GLUCOSE 145*  BUN 17  CREATININE 0.98  CALCIUM 9.3   GFR: Estimated Creatinine Clearance: 59.4 mL/min (by C-G formula based on SCr of 0.98 mg/dL). Liver Function Tests: Recent Labs  Lab 01/01/2021 1655  AST 29  ALT 23  ALKPHOS 53  BILITOT 0.9  PROT 8.2*  ALBUMIN 4.8   Recent Labs  Lab 01/21/2021 1655  LIPASE 25   No results for input(s): AMMONIA in the last 168 hours. Coagulation Profile: No results for input(s): INR, PROTIME in the last 168 hours. Cardiac Enzymes: No results for input(s): CKTOTAL, CKMB, CKMBINDEX, TROPONINI in the last 168 hours. BNP (last 3 results) No results for input(s): PROBNP in the last 8760 hours. HbA1C: No results for input(s): HGBA1C in the last 72 hours. CBG: No results for input(s): GLUCAP in the last 168 hours. Lipid Profile: No results for input(s): CHOL, HDL, LDLCALC, TRIG, CHOLHDL, LDLDIRECT in the last 72 hours. Thyroid Function Tests: No results for input(s): TSH, T4TOTAL, FREET4, T3FREE, THYROIDAB in the last 72 hours. Anemia Panel: No results for input(s): VITAMINB12, FOLATE, FERRITIN, TIBC, IRON, RETICCTPCT in the last 72 hours. Sepsis Labs: No results for input(s): PROCALCITON, LATICACIDVEN in the last 168 hours.  Recent Results (from the past 240 hour(s))  SARS CORONAVIRUS 2 (TAT 6-24 HRS) Nasopharyngeal Nasopharyngeal Swab     Status: None   Collection Time: 01/12/2021 11:00 PM   Specimen: Nasopharyngeal Swab  Result Value Ref Range Status   SARS Coronavirus 2 NEGATIVE NEGATIVE Final    Comment: (NOTE) SARS-CoV-2 target nucleic acids are NOT DETECTED.  The  SARS-CoV-2 RNA is generally detectable in upper and lower respiratory specimens during the acute phase of infection. Negative results do not preclude SARS-CoV-2 infection, do not rule out co-infections with other pathogens, and should not be used as the sole basis for treatment or other patient management decisions. Negative results must be combined with clinical observations, patient history, and epidemiological information. The expected result is Negative.  Fact Sheet for Patients: HairSlick.no  Fact Sheet for Healthcare Providers: quierodirigir.com  This test is not yet approved or cleared by the [place] FDA and  has been authorized for detection and/or diagnosis of SARS-CoV-2 by FDA under an Emergency Use Authorization (EUA). This EUA will remain  in effect (meaning this test can be used) for the duration of the COVID-19 declaration under Se ction 564(b)(1) of the Act, 21 U.S.C. section 360bbb-3(b)(1), unless the authorization is terminated or revoked sooner.  Performed at [facility], [address], [place], [place] [address]     Radiology Studies: Abd 1 View (KUB)  Result Date: 12/31/2020 CLINICAL DATA:  Small-bowel obstruction due to adhesions by report. EXAM: ABDOMEN - 1 VIEW COMPARISON:  December 30, 2018 to FINDINGS: Image limited by mild rotation.  Lung bases grossly clear. Marked distension of the stomach and small bowel loops, pattern seen to varying degrees on multiple prior radiographs grossly unchanged compared to recent radiograph. On limited assessment no acute skeletal process. IMPRESSION: Marked gaseous distension of small bowel compatible with recurrence small-bowel obstruction, seen to varying degrees on prior imaging and similar as compared to recent imaging from 2021/01/13. Electronically Signed   By: Donzetta Kohut M.D.   On: 12/31/2020 10:19   DG Abdomen Acute W/Chest  Result Date:  January 13, 2021 CLINICAL DATA:  Vomiting.  Previous bowel obstruction. EXAM: DG ABDOMEN ACUTE WITH 1 VIEW CHEST COMPARISON:  12/11/2020 FINDINGS: Recurrent small bowel obstruction pattern with markedly dilated fluid and air-filled small intestine. No sign of free air. The stomach is dilated as well. No colonic gas. No acute or significant bone finding. IMPRESSION: Recurrent small bowel obstruction pattern with markedly dilated fluid and air-filled small intestine. No free air. Electronically Signed   By: Paulina Fusi M.D.   On: 01-13-2021 21:08   Scheduled Meds: . carbamazepine  200 mg Oral BID  . [START ON 01/06/2021] cloNIDine  0.1 mg Transdermal Q Fri  . enoxaparin (LOVENOX) injection  40 mg Subcutaneous Q24H  . lamoTRIgine  150 mg Oral BID  . levETIRAcetam  500 mg Oral BID  . medroxyPROGESTERone  10 mg Oral Daily  . polyethylene glycol  17 g Oral Daily  . tamsulosin  0.4 mg Oral Daily  . traZODone  300 mg Oral QHS   Continuous Infusions: . sodium chloride 75 mL/hr at 12/31/20 1236  . sodium chloride       LOS: 0 days    Time spent: 35 mins    Cuinn Westerhold, MD [facility]   If 7PM-7AM, please contact night-coverage

## 2020-12-31 NOTE — ED Notes (Signed)
Pt is currently at the edge of the bed with bed alarm going off. This is this RN's first encounter with pt, pt was redirected back into the bed at this time. Pt will follow commands but is special needs and this RN is having hard time understanding pt's needs due to barrier of verbal communication. Per report pt has a SBO and has 1 bout of emesis while in ED, when pt is asked if belly hurts or is nauseous pt shakes his head in a  no. Pt given warm blanket and placed socks back on both feet. Bed alarm still on pt at this time.

## 2020-12-31 NOTE — ED Notes (Signed)
Pt with small amount bilious emesis onto gown (not measured)

## 2020-12-31 NOTE — ED Notes (Signed)
Pt had a vomiting episode at this time. Dr. Debby Bud and NP Anna Genre made aware via secure chart

## 2020-12-31 NOTE — ED Notes (Signed)
Dr. Lucianne Muss secured chat to see if pt can be given Haldol for agitation.

## 2020-12-31 NOTE — ED Notes (Signed)
Seizure pads placed on bed due to pt not being able to take PO anticonvulsants.

## 2021-01-01 ENCOUNTER — Inpatient Hospital Stay: Payer: Medicare Other

## 2021-01-01 ENCOUNTER — Encounter: Payer: Self-pay | Admitting: Internal Medicine

## 2021-01-01 ENCOUNTER — Other Ambulatory Visit: Payer: Self-pay

## 2021-01-01 DIAGNOSIS — F72 Severe intellectual disabilities: Secondary | ICD-10-CM | POA: Diagnosis not present

## 2021-01-01 DIAGNOSIS — G40909 Epilepsy, unspecified, not intractable, without status epilepticus: Secondary | ICD-10-CM

## 2021-01-01 DIAGNOSIS — K56609 Unspecified intestinal obstruction, unspecified as to partial versus complete obstruction: Secondary | ICD-10-CM

## 2021-01-01 DIAGNOSIS — K565 Intestinal adhesions [bands], unspecified as to partial versus complete obstruction: Secondary | ICD-10-CM

## 2021-01-01 DIAGNOSIS — F79 Unspecified intellectual disabilities: Secondary | ICD-10-CM

## 2021-01-01 DIAGNOSIS — D72829 Elevated white blood cell count, unspecified: Secondary | ICD-10-CM

## 2021-01-01 DIAGNOSIS — K6389 Other specified diseases of intestine: Secondary | ICD-10-CM | POA: Diagnosis not present

## 2021-01-01 LAB — CBC WITH DIFFERENTIAL/PLATELET
Abs Immature Granulocytes: 0.01 10*3/uL (ref 0.00–0.07)
Basophils Absolute: 0 10*3/uL (ref 0.0–0.1)
Basophils Relative: 0 %
Eosinophils Absolute: 0 10*3/uL (ref 0.0–0.5)
Eosinophils Relative: 0 %
HCT: 43 % (ref 39.0–52.0)
Hemoglobin: 14.7 g/dL (ref 13.0–17.0)
Immature Granulocytes: 0 %
Lymphocytes Relative: 8 %
Lymphs Abs: 0.3 10*3/uL — ABNORMAL LOW (ref 0.7–4.0)
MCH: 29.6 pg (ref 26.0–34.0)
MCHC: 34.2 g/dL (ref 30.0–36.0)
MCV: 86.5 fL (ref 80.0–100.0)
Monocytes Absolute: 0.2 10*3/uL (ref 0.1–1.0)
Monocytes Relative: 5 %
Neutro Abs: 3.3 10*3/uL (ref 1.7–7.7)
Neutrophils Relative %: 87 %
Platelets: 328 10*3/uL (ref 150–400)
RBC: 4.97 MIL/uL (ref 4.22–5.81)
RDW: 12.5 % (ref 11.5–15.5)
WBC: 3.8 10*3/uL — ABNORMAL LOW (ref 4.0–10.5)
nRBC: 0 % (ref 0.0–0.2)

## 2021-01-01 LAB — COMPREHENSIVE METABOLIC PANEL
ALT: 53 U/L — ABNORMAL HIGH (ref 0–44)
AST: 67 U/L — ABNORMAL HIGH (ref 15–41)
Albumin: 4 g/dL (ref 3.5–5.0)
Alkaline Phosphatase: 49 U/L (ref 38–126)
Anion gap: 14 (ref 5–15)
BUN: 59 mg/dL — ABNORMAL HIGH (ref 8–23)
CO2: 25 mmol/L (ref 22–32)
Calcium: 8.3 mg/dL — ABNORMAL LOW (ref 8.9–10.3)
Chloride: 100 mmol/L (ref 98–111)
Creatinine, Ser: 1.28 mg/dL — ABNORMAL HIGH (ref 0.61–1.24)
GFR, Estimated: >60 mL/min (ref >60)
Glucose, Bld: 184 mg/dL — ABNORMAL HIGH (ref 70–99)
Potassium: 2.9 mmol/L — ABNORMAL LOW (ref 3.5–5.1)
Sodium: 139 mmol/L (ref 135–145)
Total Bilirubin: 1.3 mg/dL — ABNORMAL HIGH (ref 0.3–1.2)
Total Protein: 7.1 g/dL (ref 6.5–8.1)

## 2021-01-01 LAB — MAGNESIUM: Magnesium: 2.5 mg/dL — ABNORMAL HIGH (ref 1.7–2.4)

## 2021-01-01 LAB — CBC
HCT: 47.1 % (ref 39.0–52.0)
Hemoglobin: 15.6 g/dL (ref 13.0–17.0)
MCH: 29.3 pg (ref 26.0–34.0)
MCHC: 33.1 g/dL (ref 30.0–36.0)
MCV: 88.5 fL (ref 80.0–100.0)
Platelets: 308 10*3/uL (ref 150–400)
RBC: 5.32 MIL/uL (ref 4.22–5.81)
RDW: 12.5 % (ref 11.5–15.5)
WBC: 15 10*3/uL — ABNORMAL HIGH (ref 4.0–10.5)
nRBC: 0 % (ref 0.0–0.2)

## 2021-01-01 LAB — PROTIME-INR
INR: 1.2 (ref 0.8–1.2)
Prothrombin Time: 14.6 seconds (ref 11.4–15.2)

## 2021-01-01 LAB — GLUCOSE, CAPILLARY
Glucose-Capillary: 161 mg/dL — ABNORMAL HIGH (ref 70–99)
Glucose-Capillary: 152 mg/dL — ABNORMAL HIGH (ref 70–99)

## 2021-01-01 LAB — LACTIC ACID, PLASMA: Lactic Acid, Venous: 1.6 mmol/L (ref 0.5–1.9)

## 2021-01-01 LAB — BASIC METABOLIC PANEL
Anion gap: 18 — ABNORMAL HIGH (ref 5–15)
BUN: 54 mg/dL — ABNORMAL HIGH (ref 8–23)
CO2: 27 mmol/L (ref 22–32)
Calcium: 9.3 mg/dL (ref 8.9–10.3)
Chloride: 95 mmol/L — ABNORMAL LOW (ref 98–111)
Creatinine, Ser: 1.45 mg/dL — ABNORMAL HIGH (ref 0.61–1.24)
GFR, Estimated: 52 mL/min — ABNORMAL LOW (ref >60)
Glucose, Bld: 143 mg/dL — ABNORMAL HIGH (ref 70–99)
Potassium: 3.5 mmol/L (ref 3.5–5.1)
Sodium: 140 mmol/L (ref 135–145)

## 2021-01-01 LAB — MRSA PCR SCREENING: MRSA by PCR: NEGATIVE

## 2021-01-01 LAB — PHOSPHORUS: Phosphorus: 5.4 mg/dL — ABNORMAL HIGH (ref 2.5–4.6)

## 2021-01-01 LAB — APTT: aPTT: 25 seconds (ref 24–36)

## 2021-01-01 MED ORDER — LEVETIRACETAM IN NACL 500 MG/100ML IV SOLN
500.0000 mg | Freq: Two times a day (BID) | INTRAVENOUS | Status: DC
  Administered 2021-01-01 – 2021-01-02 (×4): 500 mg via INTRAVENOUS
  Filled 2021-01-01 (×6): qty 100

## 2021-01-01 MED ORDER — BISACODYL 10 MG RE SUPP
10.0000 mg | Freq: Two times a day (BID) | RECTAL | Status: DC
  Administered 2021-01-01 – 2021-01-02 (×2): 10 mg via RECTAL
  Filled 2021-01-01 (×3): qty 1

## 2021-01-01 MED ORDER — SODIUM CHLORIDE 0.9 % IV BOLUS (SEPSIS)
1000.0000 mL | Freq: Once | INTRAVENOUS | Status: AC
  Administered 2021-01-01: 1000 mL via INTRAVENOUS

## 2021-01-01 MED ORDER — PIPERACILLIN-TAZOBACTAM 3.375 G IVPB
3.3750 g | Freq: Three times a day (TID) | INTRAVENOUS | Status: DC
  Administered 2021-01-02 (×2): 3.375 g via INTRAVENOUS
  Filled 2021-01-01 (×2): qty 50

## 2021-01-01 MED ORDER — DEXTROSE-NACL 5-0.45 % IV SOLN: INTRAVENOUS | Status: DC

## 2021-01-01 MED ORDER — PROMETHAZINE HCL 25 MG/ML IJ SOLN
12.5000 mg | Freq: Four times a day (QID) | INTRAMUSCULAR | Status: DC | PRN
  Administered 2021-01-01 – 2021-01-03 (×3): 12.5 mg via INTRAVENOUS
  Filled 2021-01-01 (×3): qty 1

## 2021-01-01 MED ORDER — LORAZEPAM 2 MG/ML IJ SOLN
0.5000 mg | Freq: Once | INTRAMUSCULAR | Status: AC
  Administered 2021-01-01: 0.5 mg via INTRAVENOUS
  Filled 2021-01-01: qty 1

## 2021-01-01 NOTE — Progress Notes (Signed)
Respirations 50/minut and shawllow at rest other VS noted below.  Patient unable to verbalize needs at baseline.  MD contacted, rapid response called.  MD orders stat CXR and asks respiratory therapy to consult.  1830- RT at bedside.    01/01/21 1827  Assess: MEWS Score  Temp 98.6 F (37 C)  BP 125/81  Pulse Rate (!) 115  Resp (!) 50  SpO2 95 %  Assess: MEWS Score  MEWS Temp 0  MEWS Systolic 0  MEWS Pulse 2  MEWS RR 3  MEWS LOC 0  MEWS Score 5  MEWS Score Color Red

## 2021-01-01 NOTE — Progress Notes (Signed)
   01/01/21 1651  Assess: MEWS Score  Temp 97.6 F (36.4 C)  BP (!) 147/119  Pulse Rate (!) 101  Resp (!) 28  Level of Consciousness Alert  SpO2 (!) 87 %  O2 Device Room Air  Assess: MEWS Score  MEWS Temp 0  MEWS Systolic 0  MEWS Pulse 1  MEWS RR 2  MEWS LOC 0  MEWS Score 3  MEWS Score Color Yellow  Assess: if the MEWS score is Yellow or Red  Were vital signs taken at a resting state? Yes  Focused Assessment Change from prior assessment (see assessment flowsheet)  Early Detection of Sepsis Score *See Row Information* Low  MEWS guidelines implemented *See Row Information* Yes  Treat  MEWS Interventions Escalated (See documentation below)  Take Vital Signs  Increase Vital Sign Frequency  Yellow: Q 2hr X 2 then Q 4hr X 2, if remains yellow, continue Q 4hrs  Escalate  MEWS: Escalate Yellow: discuss with charge nurse/RN and consider discussing with provider and RRT  Notify: Charge Nurse/RN  Name of Charge Nurse/RN Notified Everardo All, RN  Date Charge Nurse/RN Notified 01/01/21  Time Charge Nurse/RN Notified 1700  Document  Patient Outcome Not stable and remains on department

## 2021-01-01 NOTE — Progress Notes (Signed)
CODE SEPSIS - PHARMACY COMMUNICATION  **Broad Spectrum Antibiotics should be administered within 1 hour of Sepsis diagnosis**  Time Code Sepsis Called/Page Received: 2110 for SUSPECTED sepsis  Antibiotics Ordered: Zosyn @ 2128  Additional action taken by pharmacy: called 2C unit to notify RN able to pull Zosyn from Pyxis   Doroteo Glassman ,PharmD Clinical Pharmacist  01/01/2021  9:28 PM

## 2021-01-01 NOTE — Progress Notes (Signed)
Pharmacy Antibiotic Note  Richard Gillespie is a 70 y.o. male admitted on January 18, 2021 with severe nausea and vomiting c/f SBO vs ileus now found to have aspiration pneumonia/sepsis.  Pharmacy has been consulted for Zosyn dosing.  WBC have trended up over admission, patient slightly febrile at 100.5. SCr up to 1.45 (was <1 at admission).  Plan: Zosyn 3.375 g IV q8h given over 4 hours Monitor clinical picture, renal function F/U C&S, abx de-escalation, LOT   Height: 5\' 4"  (162.6 cm) Weight: 59 kg (130 lb) IBW/kg (Calculated) : 59.2  Temp (24hrs), Avg:98.5 F (36.9 C), Min:97.6 F (36.4 C), Max:100.5 F (38.1 C)  Recent Labs  Lab January 18, 2021 1655 12/31/20 1227 01/01/21 0521 01/01/21 1051  WBC 14.1* 14.6* 15.0*  --   CREATININE 0.98  --   --  1.45*    Estimated Creatinine Clearance: 40.1 mL/min (A) (by C-G formula based on SCr of 1.45 mg/dL (H)).    Allergies  Allergen Reactions  . Zithromax [Azithromycin] Other (See Comments)    unknown    Antimicrobials this admission: Zosyn 2/6 >  Microbiology results: 2/5 MRSA PCR neg 2/4 covid negative    03/01/21, PharmD Clinical Pharmacist  01/01/2021   9:30 PM

## 2021-01-01 NOTE — Progress Notes (Signed)
[facility] SURGICAL PROGRESS NOTE (cpt [phone])  Hospital Day(s): 1.   Post op day(s):  Marland Kitchen   Interval History: Patient seen and examined, no acute events or new complaints overnight. Patient appears to be bright, in no distress, cooperative and far less agitated.  Review of Systems:  Unable to obtain due to mental status  Vital signs in last 24 hours: [min-max] current  Temp:  [98 F (36.7 C)-98.3 F (36.8 C)] 98.3 F (36.8 C) (02/06 0442) Pulse Rate:  [80-106] 106 (02/06 0442) Resp:  [18-20] 20 (02/06 0442) BP: (101-165)/(61-93) 144/93 (02/06 0442) SpO2:  [93 %-98 %] 93 % (02/06 0442)     Height: 5\' 4"  (162.6 cm) Weight: 59 kg BMI (Calculated): 22.3   Intake/Output last 2 shifts:  02/05 0701 - 02/06 0700 In: 2025 [I.V.:2025] Out: -    Physical Exam:  Constitutional: alert, cooperative and no distress   Neuro: Alert, appears at baseline. Respiratory: breathing non-labored Cardiovascular: regular rate  Gastrointestinal: soft, non-tender, and non-distended Musculoskeletal: UE and LE with no edema or wounds, motor and sensation grossly intact,    Labs:  CBC Latest Ref Rng & Units 01/01/2021 12/31/2020 01/15/2021  WBC 4.0 - 10.5 K/uL 15.0(H) 14.6(H) 14.1(H)  Hemoglobin 13.0 - 17.0 g/dL 02/27/2021 84.5 36.4  Hematocrit 39.0 - 52.0 % 47.1 45.0 42.3  Platelets 150 - 400 K/uL 308 353 315   CMP Latest Ref Rng & Units 01/17/2021 12/13/2020 12/12/2020  Glucose 70 - 99 mg/dL 12/14/2020) 321(Y) 93  BUN 8 - 23 mg/dL 17 16 12   Creatinine 0.61 - 1.24 mg/dL 248(G 5.00  Sodium 135 - 145 mmol/L 138 145 142  Potassium 3.5 - 5.1 mmol/L 3.9 3.6 3.9  Chloride 98 - 111 mmol/L 102 107 106  CO2 22 - 32 mmol/L 22 28 27   Calcium 8.9 - 10.3 mg/dL 9.3 8.9 9.0  Total Protein 6.5 - 8.1 g/dL 8.2(H) - -  Total Bilirubin 0.3 - 1.2 mg/dL 0.9 - -  Alkaline Phos 38 - 126 U/L 53 - -  AST 15 - 41 U/L 29 - -  ALT 0 - 44 U/L 23 - -     Imaging studies: No new pertinent imaging  studies   Assessment/Plan:  This is a 70 year old man with significant developmental delay who was brought to [facility] due to vomiting.   Looking back through his chart over several years, his abdominal plain films show the same pattern, even when not at the hospital for possible bowel-related issues.  Gas distended colon and stomach with left hemidiaphragm elevation has been reported on his plain films since 2001.  He likely has some degree of chronic GI dysmotility secondary to his chronic illness and polypharmacy.  He does have a prior history of bowel obstruction, treated with adhesiolysis, however, at this time, I think nasoenteric decompression is not feasible.    And likely unhelpful considering the above.  complicated by pertinent comorbidities including: Patient Active Problem List   Diagnosis Date Noted  . Small bowel obstruction due to adhesions (HCC) 12/27/2020  . Intellectual disability 12/13/2020  . Hypomagnesemia   . Hypotension   . Thrush   . Acute urinary retention   . Small bowel obstruction (HCC) 08/03/2019  . Malnutrition of moderate degree 09/17/2018  . SBO (small bowel obstruction) (HCC) 09/12/2018  . Aspiration pneumonia (HCC) 09/12/2018  . Seizure disorder (HCC) 09/12/2018  . Sepsis (HCC) 09/12/2018  . E coli infection 01/15/2017  . Agitation 01/15/2017  . Diarrhea 01/15/2017  .  Hypokalemia 01/15/2017  . Hyponatremia 01/15/2017  . Leukocytosis 01/15/2017  . Hyperglycemia 01/15/2017  . UTI (urinary tract infection) 01/12/2017     -Would consider gradual resumption of clear liquid diet, with bowel regimen initiated from below utilizing suppositories enemas as needed.  -I think maintaining NG tube may be rather challenging, and I see no evidence of any significant bilious emesis over the last 12 hours.  -Seems his electrolytes remain essentially stable,  -I have no clear-cut explanation for his apparent mild leukocytosis.      -- Campbell Lerner M.D.,  [facility] 01/01/2021 8:57 AM

## 2021-01-01 NOTE — Progress Notes (Signed)
01/01/2021 1615  Attempted insertion of 2fr nasogastric tube in right nare according to orders.  X-ray indicated it appeared to be in the lung.  NG tube promptly removed and will be re-attempted. Bradly Chris, RN

## 2021-01-01 NOTE — Progress Notes (Signed)
PROGRESS NOTE    Richard Gillespie  NIO:[id number] DOB: 05-19-1951 DOA: Jan 21, 2021 PCP: Gracelyn Nurse, MD   Chief complaint.  Nausea vomiting. Brief Narrative:  Richard Gillespie is a 70 y.o. male with medical history significant of hypertension, seizures, severe intellectual disability, history of small bowel obstructionsstatus post laparotomy and lysis of adhesion. He presents to the emergency department because of vomiting.  Abdominal film showed dilated loops of small bowel and distention of the stomach.  He is diagnosed with small bowel obstruction versus ileus.  Was placed on fluids and n.p.o.  Assessment & Plan:   Active Problems:   Leukocytosis   SBO (small bowel obstruction) (HCC)   Seizure disorder (HCC)   Intellectual disability   Small bowel obstruction due to adhesions (HCC)  #1.  Small bowel obstruction versus ileus. Abdominal film showed distended small bowel bowel.  Patient has decreased blood bowel sounds on physical examination, prefer ileus diagnosis. Has severe nausea vomiting today, will start NG suction.  Continue IV rehydration.  Zofran was given, seem to be not effective, added Phenergan as needed.  Also added Dulcolax per rectum twice a day.  #2.  Seizure disorder. Intellectual disability. Patient has not been able to take p.o., will change most seizure medicine to IV.    DVT prophylaxis: Lovenox Code Status: DNR Family Communication:  Disposition Plan:  .   Status is: Inpatient  Remains inpatient appropriate because:Inpatient level of care appropriate due to severity of illness   Dispo: The patient is from: Home              Anticipated d/c is to: Home              Anticipated d/c date is: 3 days              Patient currently is not medically stable to d/c.   Difficult to place patient No        I/O last 3 completed shifts: In: 2025 [I.V.:2025] Out: -  No intake/output data recorded.     Consultants:   General Suregry  Procedures:  None  Antimicrobials:None  Subjective: Patient had a significant nausea, vomiting several times this morning.  Does not seem to have any abdominal pain.  No bowel movement since admission. No fever chills. No short of breath or cough.  Objective: Vitals:   12/31/20 1820 12/31/20 1957 01/01/21 0442 01/01/21 0858  BP: (!) 165/76 (!) 147/86 (!) 144/93 (!) 121/99  Pulse: 84 (!) 102 (!) 106 (!) 111  Resp: 18 18 20 20   Temp: 98.1 F (36.7 C) 98 F (36.7 C) 98.3 F (36.8 C) 98.3 F (36.8 C)  TempSrc: Oral Oral Oral Oral  SpO2: 98% 95% 93% 95%  Weight:      Height:        Intake/Output Summary (Last 24 hours) at 01/01/2021 1046 Last data filed at 01/01/2021 0300 Gross per 24 hour  Intake 2025 ml  Output --  Net 2025 ml   Filed Weights   2021/01/21 1650  Weight: 59 kg    Examination:  General exam: Appears calm and comfortable  Respiratory system: Clear to auscultation. Respiratory effort normal. Cardiovascular system: S1 & S2 heard, RRR. No JVD, murmurs, rubs, gallops or clicks. No pedal edema. Gastrointestinal system: Abdomen is nondistended, soft and nontender. No organomegaly or masses felt.  Bowel sounds decreased. Central nervous system: Alert and confused. No focal neurological deficits. Extremities: Symmetric  Skin: No rashes, lesions or ulcers  Data Reviewed: I have personally reviewed following labs and imaging studies  CBC: Recent Labs  Lab 2021/01/07 1655 12/31/20 1227 01/01/21 0521  WBC 14.1* 14.6* 15.0*  NEUTROABS  --  12.3*  --   HGB 14.5 15.0 15.6  HCT 42.3 45.0 47.1  MCV 86.5 88.2 88.5  PLT 315 353 308   Basic Metabolic Panel: Recent Labs  Lab 07-Jan-2021 1655  NA 138  K 3.9  CL 102  CO2 22  GLUCOSE 145*  BUN 17  CREATININE 0.98  CALCIUM 9.3   GFR: Estimated Creatinine Clearance: 59.4 mL/min (by C-G formula based on SCr of 0.98 mg/dL). Liver Function Tests: Recent Labs  Lab 2021-01-07 1655  AST 29  ALT 23  ALKPHOS 53  BILITOT 0.9   PROT 8.2*  ALBUMIN 4.8   Recent Labs  Lab January 07, 2021 1655  LIPASE 25   No results for input(s): AMMONIA in the last 168 hours. Coagulation Profile: No results for input(s): INR, PROTIME in the last 168 hours. Cardiac Enzymes: No results for input(s): CKTOTAL, CKMB, CKMBINDEX, TROPONINI in the last 168 hours. BNP (last 3 results) No results for input(s): PROBNP in the last 8760 hours. HbA1C: No results for input(s): HGBA1C in the last 72 hours. CBG: No results for input(s): GLUCAP in the last 168 hours. Lipid Profile: No results for input(s): CHOL, HDL, LDLCALC, TRIG, CHOLHDL, LDLDIRECT in the last 72 hours. Thyroid Function Tests: No results for input(s): TSH, T4TOTAL, FREET4, T3FREE, THYROIDAB in the last 72 hours. Anemia Panel: No results for input(s): VITAMINB12, FOLATE, FERRITIN, TIBC, IRON, RETICCTPCT in the last 72 hours. Sepsis Labs: No results for input(s): PROCALCITON, LATICACIDVEN in the last 168 hours.  Recent Results (from the past 240 hour(s))  SARS CORONAVIRUS 2 (TAT 6-24 HRS) Nasopharyngeal Nasopharyngeal Swab     Status: None   Collection Time: Jan 07, 2021 11:00 PM   Specimen: Nasopharyngeal Swab  Result Value Ref Range Status   SARS Coronavirus 2 NEGATIVE NEGATIVE Final    Comment: (NOTE) SARS-CoV-2 target nucleic acids are NOT DETECTED.  The SARS-CoV-2 RNA is generally detectable in upper and lower respiratory specimens during the acute phase of infection. Negative results do not preclude SARS-CoV-2 infection, do not rule out co-infections with other pathogens, and should not be used as the sole basis for treatment or other patient management decisions. Negative results must be combined with clinical observations, patient history, and epidemiological information. The expected result is Negative.  Fact Sheet for Patients: HairSlick.no  Fact Sheet for Healthcare Providers: quierodirigir.com  This  test is not yet approved or cleared by the [place] FDA and  has been authorized for detection and/or diagnosis of SARS-CoV-2 by FDA under an Emergency Use Authorization (EUA). This EUA will remain  in effect (meaning this test can be used) for the duration of the COVID-19 declaration under Se ction 564(b)(1) of the Act, 21 U.S.C. section 360bbb-3(b)(1), unless the authorization is terminated or revoked sooner.  Performed at [facility], [address], [place], [place] [address]   MRSA PCR Screening     Status: None   Collection Time: 12/31/20 10:49 PM   Specimen: Nasopharyngeal  Result Value Ref Range Status   MRSA by PCR NEGATIVE NEGATIVE Final    Comment:        The GeneXpert MRSA Assay (FDA approved for NASAL specimens only), is one component of a comprehensive MRSA colonization surveillance program. It is not intended to diagnose MRSA infection nor to guide or monitor treatment for  MRSA infections. Performed at [facility], [address], [place], [place] [address]          Radiology Studies: Abd 1 View (KUB)  Result Date: 12/31/2020 CLINICAL DATA:  Small-bowel obstruction due to adhesions by report. EXAM: ABDOMEN - 1 VIEW COMPARISON:  December 30, 2018 to FINDINGS: Image limited by mild rotation.  Lung bases grossly clear. Marked distension of the stomach and small bowel loops, pattern seen to varying degrees on multiple prior radiographs grossly unchanged compared to recent radiograph. On limited assessment no acute skeletal process. IMPRESSION: Marked gaseous distension of small bowel compatible with recurrence small-bowel obstruction, seen to varying degrees on prior imaging and similar as compared to recent imaging from December 30, 2020. Electronically Signed   By: Donzetta Kohut M.D.   On: 12/31/2020 10:19   DG Abdomen Acute W/Chest  Result Date: 01/18/2021 CLINICAL DATA:  Vomiting.  Previous bowel obstruction. EXAM: DG ABDOMEN ACUTE WITH 1  VIEW CHEST COMPARISON:  12/11/2020 FINDINGS: Recurrent small bowel obstruction pattern with markedly dilated fluid and air-filled small intestine. No sign of free air. The stomach is dilated as well. No colonic gas. No acute or significant bone finding. IMPRESSION: Recurrent small bowel obstruction pattern with markedly dilated fluid and air-filled small intestine. No free air. Electronically Signed   By: Paulina Fusi M.D.   On: 01/23/2021 21:08        Scheduled Meds: . carbamazepine  200 mg Oral BID  . [START ON 01/06/2021] cloNIDine  0.1 mg Transdermal Q Fri  . enoxaparin (LOVENOX) injection  40 mg Subcutaneous Q24H  . lamoTRIgine  150 mg Oral BID  . LORazepam  0.5 mg Intravenous Once  . medroxyPROGESTERone  10 mg Oral Daily  . polyethylene glycol  17 g Oral Daily  . tamsulosin  0.4 mg Oral Daily  . traZODone  300 mg Oral QHS   Continuous Infusions: . dextrose 5 % and 0.45% NaCl    . levETIRAcetam       LOS: 1 day    Time spent: 27 minutes    Marrion Coy, MD [facility]   To contact the attending provider between 7A-7P or the covering provider during after hours 7P-7A, please log into the web site [email or web address] and access using universal [place] password for that web site. If you do not have the password, please call the hospital operator.  01/01/2021, 10:46 AM

## 2021-01-01 NOTE — Progress Notes (Signed)
Elink following for Sepsis Protocol 

## 2021-01-01 NOTE — Progress Notes (Signed)
   01/01/21 1827  Assess: MEWS Score  Temp 98.6 F (37 C)  BP 125/81  Pulse Rate (!) 115  Resp (!) 50  SpO2 95 %  Assess: MEWS Score  MEWS Temp 0  MEWS Systolic 0  MEWS Pulse 2  MEWS RR 3  MEWS LOC 0  MEWS Score 5  MEWS Score Color Red  Assess: if the MEWS score is Yellow or Red  Were vital signs taken at a resting state? Yes  Focused Assessment Change from prior assessment (see assessment flowsheet)  Early Detection of Sepsis Score *See Row Information* Medium  MEWS guidelines implemented *See Row Information* Yes  Treat  MEWS Interventions Escalated (See documentation below);Consulted Respiratory Therapy  Pain Scale FLACC  Pain Score 0  Take Vital Signs  Increase Vital Sign Frequency  Red: Q 1hr X 4 then Q 4hr X 4, if remains red, continue Q 4hrs  Escalate  MEWS: Escalate Red: discuss with charge nurse/RN and provider, consider discussing with RRT  Notify: Charge Nurse/RN  Name of Charge Nurse/RN Notified Ree Kida, RN  Date Charge Nurse/RN Notified 01/01/21  Time Charge Nurse/RN Notified 1830  Notify: Provider  Provider Name/Title Marrion Coy, MD  Date Provider Notified 01/01/21  Time Provider Notified [phone]  Notification Type Page  Notification Reason Change in status  Response See new orders  Notify: Rapid Response  Date Rapid Response Notified 01/01/21  Time Rapid Response Notified 1830

## 2021-01-01 NOTE — Progress Notes (Signed)
Patient vomited large amount liquid/green, new O2 need after this event due to 87% SPO2 on room air.  Promptly returned to 95% on 2L/[facility]. Unsuccessful second NG tube attempt. MD notified.

## 2021-01-01 NOTE — Progress Notes (Signed)
1905 On call, Dr. Para March contacted notified patient continues to breathe at 50 resps/minute.  MD placed order for ABG, continuous pulse ox, and cardiac monitoring. Respiratory contacted to notify of ABG order.

## 2021-01-02 DIAGNOSIS — J9601 Acute respiratory failure with hypoxia: Secondary | ICD-10-CM

## 2021-01-02 DIAGNOSIS — J690 Pneumonitis due to inhalation of food and vomit: Secondary | ICD-10-CM | POA: Diagnosis not present

## 2021-01-02 DIAGNOSIS — G40909 Epilepsy, unspecified, not intractable, without status epilepticus: Secondary | ICD-10-CM | POA: Diagnosis not present

## 2021-01-02 DIAGNOSIS — K56609 Unspecified intestinal obstruction, unspecified as to partial versus complete obstruction: Secondary | ICD-10-CM | POA: Diagnosis not present

## 2021-01-02 LAB — BASIC METABOLIC PANEL
Anion gap: 13 (ref 5–15)
BUN: 43 mg/dL — ABNORMAL HIGH (ref 8–23)
CO2: 29 mmol/L (ref 22–32)
Calcium: 8.2 mg/dL — ABNORMAL LOW (ref 8.9–10.3)
Chloride: 101 mmol/L (ref 98–111)
Creatinine, Ser: 1.03 mg/dL (ref 0.61–1.24)
GFR, Estimated: >60 mL/min (ref >60)
Glucose, Bld: 177 mg/dL — ABNORMAL HIGH (ref 70–99)
Potassium: 3.2 mmol/L — ABNORMAL LOW (ref 3.5–5.1)
Sodium: 143 mmol/L (ref 135–145)

## 2021-01-02 LAB — CBC WITH DIFFERENTIAL/PLATELET
Abs Immature Granulocytes: 0.03 10*3/uL (ref 0.00–0.07)
Basophils Absolute: 0 10*3/uL (ref 0.0–0.1)
Basophils Relative: 0 %
Eosinophils Absolute: 0 10*3/uL (ref 0.0–0.5)
Eosinophils Relative: 0 %
HCT: 41.3 % (ref 39.0–52.0)
Hemoglobin: 14.3 g/dL (ref 13.0–17.0)
Immature Granulocytes: 0 %
Lymphocytes Relative: 6 %
Lymphs Abs: 0.8 10*3/uL (ref 0.7–4.0)
MCH: 30.2 pg (ref 26.0–34.0)
MCHC: 34.6 g/dL (ref 30.0–36.0)
MCV: 87.3 fL (ref 80.0–100.0)
Monocytes Absolute: 0.7 10*3/uL (ref 0.1–1.0)
Monocytes Relative: 6 %
Neutro Abs: 11.5 10*3/uL — ABNORMAL HIGH (ref 1.7–7.7)
Neutrophils Relative %: 88 %
Platelets: 282 10*3/uL (ref 150–400)
RBC: 4.73 MIL/uL (ref 4.22–5.81)
RDW: 12.7 % (ref 11.5–15.5)
WBC: 13.1 10*3/uL — ABNORMAL HIGH (ref 4.0–10.5)
nRBC: 0 % (ref 0.0–0.2)

## 2021-01-02 LAB — BLOOD GAS, ARTERIAL
Acid-Base Excess: 7.8 mmol/L — ABNORMAL HIGH (ref 0.0–2.0)
Bicarbonate: 30.9 mmol/L — ABNORMAL HIGH (ref 20.0–28.0)
FIO2: 0.28
O2 Saturation: 94.7 %
Patient temperature: 37
pCO2 arterial: 37 mmHg (ref 32.0–48.0)
pH, Arterial: 7.53 — ABNORMAL HIGH (ref 7.350–7.450)
pO2, Arterial: 65 mmHg — ABNORMAL LOW (ref 83.0–108.0)

## 2021-01-02 LAB — LACTIC ACID, PLASMA: Lactic Acid, Venous: 1.5 mmol/L (ref 0.5–1.9)

## 2021-01-02 MED ORDER — SODIUM CHLORIDE 0.9 % IV SOLN
3.0000 g | Freq: Three times a day (TID) | INTRAVENOUS | Status: DC
  Administered 2021-01-02 – 2021-01-03 (×3): 3 g via INTRAVENOUS
  Filled 2021-01-02: qty 8
  Filled 2021-01-02 (×2): qty 3
  Filled 2021-01-02: qty 8
  Filled 2021-01-02: qty 3

## 2021-01-02 MED ORDER — POTASSIUM CHLORIDE 10 MEQ/100ML IV SOLN
10.0000 meq | INTRAVENOUS | Status: AC
  Administered 2021-01-02 (×2): 10 meq via INTRAVENOUS
  Filled 2021-01-02 (×2): qty 100

## 2021-01-02 NOTE — Progress Notes (Signed)
SURGICAL PROGRESS NOTE   Hospital Day(s): 2.   Post op day(s):  Marland Kitchen   Interval History: Patient seen and examined.  Patient did not vomit overnight most likely because he vomited everything before my shift.  Otherwise patient was arousable and alert to be more calm.  Unable to take history.  Vital signs in last 24 hours: [min-max] current  Temp:  [97.6 F (36.4 C)-100.5 F (38.1 C)] 99.3 F (37.4 C) (02/07 0456) Pulse Rate:  [71-124] 90 (02/07 0456) Resp:  [16-50] 16 (02/07 0456) BP: (109-148)/(69-119) 131/80 (02/07 0456) SpO2:  [87 %-97 %] 97 % (02/07 0456)     Height: 5\' 4"  (162.6 cm) Weight: 59 kg BMI (Calculated): 22.3   Physical Exam:  Constitutional: alert, and no distress  Cardiovascular: regular rate and sinus rhythm  Gastrointestinal: soft, non-tender, and distended  Labs:  CBC Latest Ref Rng & Units 01/02/2021 01/01/2021 01/01/2021  WBC 4.0 - 10.5 K/uL 13.1(H) 3.8(L) 15.0(H)  Hemoglobin 13.0 - 17.0 g/dL 03/01/2021 45.8 09.9  Hematocrit 39.0 - 52.0 % 41.3 43.0 47.1  Platelets 150 - 400 K/uL 282 328 308   CMP Latest Ref Rng & Units 01/01/2021 01/01/2021 01/15/2021  Glucose 70 - 99 mg/dL 02/27/2021) 825(K) 539(J)  BUN 8 - 23 mg/dL 673(A) 19(F) 17  Creatinine 0.61 - 1.24 mg/dL 79(K) 2.40(X) 7.35(H  Sodium 135 - 145 mmol/L 139 140 138  Potassium 3.5 - 5.1 mmol/L 2.9(L) 3.5 3.9  Chloride 98 - 111 mmol/L 100 95(L) 102  CO2 22 - 32 mmol/L 25 27 22   Calcium 8.9 - 10.3 mg/dL 8.3(L) 9.3 9.3  Total Protein 6.5 - 8.1 g/dL 7.1 - 8.2(H)  Total Bilirubin 0.3 - 1.2 mg/dL 2.99) - 0.9  Alkaline Phos 38 - 126 U/L 49 - 53  AST 15 - 41 U/L 67(H) - 29  ALT 0 - 44 U/L 53(H) - 23    Imaging studies: I personally evaluated the abdominal x-ray done yesterday that shows persistent small bowel dilation.  This x-rays are similar to previous abdominal x-rays.  No free air under the diaphragm.   Assessment/Plan:  70 y.o.maleadmittedwithsmall bowel obstruction, complicated by pertinent comorbidities  includingsevere developmental delay, aspiration pneumonia and seizure disorder.  Patient with recurrent small bowel obstruction.  Patient has always resolved with conservative management.  Agreed to try NGT by fluoroscopy.  This will help with gastric dilatation and respirations.  Hopefully we can try to save a big surgical procedure on this patient.  If NGT is not able to be placed and there is no resolution on the obstruction versus ileus, patient might need surgical management.  We will continue to follow closely.  2.4(Q, MD

## 2021-01-02 NOTE — Progress Notes (Signed)
Call from telemetry, patient in Atrial Fibrillation 90-110 rate.  MD Chipper Herb notified

## 2021-01-02 NOTE — TOC Initial Note (Signed)
Transition of Care [facility]) - Initial/Assessment Note    Patient Details  Name: Richard Gillespie MRN: [id number] Date of Birth: 1951-02-11  [facility]) CM/SW Contact:    Chapman Fitch, RN Phone Number: 01/02/2021, 1:42 PM  Clinical Narrative:                 [facility] Patient brother legal guardian confirms he wishes for patient to return to [facility] request I call Mr Lafayette Dragon and Lupita Leash at [employer]. I have called both of them and updated them on patient's status   Expected Discharge Plan: Group Home Barriers to Discharge: No Barriers Identified   Patient Goals and CMS Choice        Expected Discharge Plan and Services Expected Discharge Plan: Group Home       Living arrangements for the past 2 months: Group Home                                      Prior Living Arrangements/Services Living arrangements for the past 2 months: Group Home Lives with:: Facility Resident                   Activities of Daily Living      Permission Sought/Granted                  Emotional Assessment       Orientation: : Oriented to Self      Admission diagnosis:  Small bowel obstruction due to adhesions (HCC) [K56.50] Vomiting, intractability of vomiting not specified, presence of nausea not specified, unspecified vomiting type [R11.10] Patient Active Problem List   Diagnosis Date Noted  . Acute hypoxemic respiratory failure (HCC) 01/02/2021  . Small bowel obstruction due to adhesions (HCC) 01/09/2021  . Intellectual disability 12/13/2020  . Hypomagnesemia   . Hypotension   . Thrush   . Acute urinary retention   . Small bowel obstruction (HCC) 08/03/2019  . Malnutrition of moderate degree 09/17/2018  . SBO (small bowel obstruction) (HCC) 09/12/2018  . Aspiration pneumonia of both lower lobes due to gastric secretions (HCC) 09/12/2018  . Seizure disorder (HCC) 09/12/2018  . Sepsis (HCC) 09/12/2018   . E coli infection 01/15/2017  . Agitation 01/15/2017  . Diarrhea 01/15/2017  . Hypokalemia 01/15/2017  . Hyponatremia 01/15/2017  . Leukocytosis 01/15/2017  . Hyperglycemia 01/15/2017  . UTI (urinary tract infection) 01/12/2017   PCP:  Gracelyn Nurse, MD Pharmacy:   [facility] - [place], [place] - [address] [place] [address] Phone: [phone] Fax: [phone]     Social Determinants of Health (SDOH) Interventions    Readmission Risk Interventions Readmission Risk Prevention Plan 01/02/2021  Transportation Screening Complete  Palliative Care Screening Complete  Medication Review (RN Care Manager) Complete  Some recent data might be hidden

## 2021-01-02 NOTE — Progress Notes (Addendum)
PROGRESS NOTE    Richard Gillespie  WPY:[id number] DOB: 04-10-1951 DOA: 01/19/2021 PCP: Gracelyn Nurse, MD   Follow-up on small bowel traction Brief Narrative:  Richard Gillespie a 70 y.o.malewith medical history significant ofhypertension, seizures, severe intellectual disability, history of small bowel obstructionsstatus post laparotomy and lysis of adhesion. Hepresents to the emergency department because of vomiting.  Abdominal film showed dilated loops of small bowel and distention of the stomach.  He is diagnosed with small bowel obstruction versus ileus.  Was placed on fluids and n.p.o.   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  Small bowel obstruction. Aspiration pneumonia secondary to nausea vomiting. Acute hypoxemic respiratory failure. Patient developed hypoxemia and tachypnea after vomited.  Chest x-ray which I personally reviewed the image, showed a right lower lobe infiltrates consistent with aspiration pneumonia.  He is started on Zosyn last night. Patient condition does not seem to be improving, not able to place NG suction. I had a long discussion with the patient and guardian, his brother.  Patient has previously had 2 bowel resections for small bowel obstruction.  Brother believed that additional surgery will not help.  It would also negatively impact patient quality of life.  As a result, he is against any additional surgery.  We also talked about fluoroscopy NG placement, decided to hold off on it.  If patient condition does not improve in 2 or 3 days, patient can be transferred to comfort care. .Before the final decision is made, he wants to talk his siblings before make that decision. As result, we will hold off NG tube placement.  Continue fluids and antibiotics.  No additional changes in treatment plan.  #2.   Seizure disorder. Intellectual disability. Seizure medicine has changed to IV.  Currently patient does not have any seizures.  #3.  Hypokalemia. Supplemented.  Addendum: patient had a large BM. Will start clear liquid diet.   DVT prophylaxis: Lovenox Code Status: Full Family Communication: As above. Disposition Plan:  .   Status is: Inpatient  Remains inpatient appropriate because:Inpatient level of care appropriate due to severity of illness   Dispo: The patient is from: Home              Anticipated d/c is to: To be determined              Anticipated d/c date is: 3 days              Patient currently is not medically stable to d/c.   Difficult to place patient No        I/O last 3 completed shifts: In: 3189.7 [I.V.:2939.7; IV Piggyback:250] Out: 600 [Urine:600] No intake/output data recorded.     Consultants:   General surgery  Procedures: None  Antimicrobials:  Zosyn  Subjective: Not able to place NG tube at bedside yesterday.  Patient aspirated, developed hypoxemia.  Put on 2 L oxygen.  Patient also had tachypnea since yesterday.  ABG did not show significant CO2 retention. Patient also developed a fever. He still has no bowel movement.  He has nausea, but vomiting is better today.   Objective: Vitals:   01/01/21 1930 01/01/21 2042 01/01/21 2349 01/02/21 0456  BP: 140/85 115/71 109/69 131/80  Pulse: (!) 105 (!) 124 93 90  Resp: (!) 46 20 (!) 22 16  Temp: 98.2 F (36.8 C) (!) 100.5 F (38.1 C) 99.5 F (37.5 C) 99.3 F (37.4 C)  TempSrc:  Oral Axillary Oral  SpO2: 92% 92% 97% 97%  Weight:      Height:        Intake/Output Summary (Last 24 hours) at 01/02/2021 0934 Last data filed at 01/02/2021 0500 Gross per 24 hour  Intake 1164.67 ml  Output 600 ml  Net 564.67 ml   Filed Weights   01/03/2021 1650  Weight: 59 kg    Examination:  General exam: Ill appearing Respiratory system: Crackles in the right lower lobe.   Tachypnea. Cardiovascular system: S1 & S2 heard, RRR. No JVD, murmurs, rubs, gallops or clicks. No pedal edema. Gastrointestinal system: Abdomen is distended, soft and nontender. No organomegaly or masses felt.  Central nervous system: Very drowsy, confused. No focal neurological deficits. Extremities: Symmetric  Skin: No rashes, lesions or ulcers     Data Reviewed: I have personally reviewed following labs and imaging studies  CBC: Recent Labs  Lab 01/07/2021 1655 12/31/20 1227 01/01/21 0521 01/01/21 2131 01/02/21 0415  WBC 14.1* 14.6* 15.0* 3.8* 13.1*  NEUTROABS  --  12.3*  --  3.3 11.5*  HGB 14.5 15.0 15.6 14.7 14.3  HCT 42.3 45.0 47.1 43.0 41.3  MCV 86.5 88.2 88.5 86.5 87.3  PLT 315 353 308 328 282   Basic Metabolic Panel: Recent Labs  Lab 12/31/2020 1655 01/01/21 1051 01/01/21 2131 01/02/21 0745  NA 138 140 139 143  K 3.9 3.5 2.9* 3.2*  CL 102 95* 100 101  CO2 22 27 25 29   GLUCOSE 145* 143* 184* 177*  BUN 17 54* 59* 43*  CREATININE 0.98 1.45* 1.28* 1.03  CALCIUM 9.3 9.3 8.3* 8.2*  MG  --  2.5*  --   --   PHOS  --  5.4*  --   --    GFR: Estimated Creatinine Clearance: 56.5 mL/min (by C-G formula based on SCr of 1.03 mg/dL). Liver Function Tests: Recent Labs  Lab 01/08/2021 1655 01/01/21 2131  AST 29 67*  ALT 23 53*  ALKPHOS 53 49  BILITOT 0.9 1.3*  PROT 8.2* 7.1  ALBUMIN 4.8 4.0   Recent Labs  Lab 01/21/2021 1655  LIPASE 25   No results for input(s): AMMONIA in the last 168 hours. Coagulation Profile: Recent Labs  Lab 01/01/21 2131  INR 1.2   Cardiac Enzymes: No results for input(s): CKTOTAL, CKMB, CKMBINDEX, TROPONINI in the last 168 hours. BNP (last 3 results) No results for input(s): PROBNP in the last 8760 hours. HbA1C: No results for input(s): HGBA1C in the last 72 hours. CBG: Recent Labs  Lab 01/01/21 1829 01/01/21 1938  GLUCAP 161* 152*   Lipid Profile: No results for input(s): CHOL, HDL, LDLCALC, TRIG, CHOLHDL, LDLDIRECT in the  last 72 hours. Thyroid Function Tests: No results for input(s): TSH, T4TOTAL, FREET4, T3FREE, THYROIDAB in the last 72 hours. Anemia Panel: No results for input(s): VITAMINB12, FOLATE, FERRITIN, TIBC, IRON, RETICCTPCT in the last 72 hours. Sepsis Labs: Recent Labs  Lab 01/01/21 2133 01/02/21 0056  LATICACIDVEN 1.6 1.5    Recent Results (from the past 240 hour(s))  SARS CORONAVIRUS 2 (TAT 6-24 HRS) Nasopharyngeal Nasopharyngeal Swab     Status: None   Collection Time: 01/14/2021 11:00 PM   Specimen: Nasopharyngeal Swab  Result Value Ref Range Status   SARS Coronavirus 2 NEGATIVE NEGATIVE Final    Comment: (NOTE) SARS-CoV-2 target nucleic acids are NOT DETECTED.  The SARS-CoV-2 RNA  is generally detectable in upper and lower respiratory specimens during the acute phase of infection. Negative results do not preclude SARS-CoV-2 infection, do not rule out co-infections with other pathogens, and should not be used as the sole basis for treatment or other patient management decisions. Negative results must be combined with clinical observations, patient history, and epidemiological information. The expected result is Negative.  Fact Sheet for Patients: HairSlick.no  Fact Sheet for Healthcare Providers: quierodirigir.com  This test is not yet approved or cleared by the [place] FDA and  has been authorized for detection and/or diagnosis of SARS-CoV-2 by FDA under an Emergency Use Authorization (EUA). This EUA will remain  in effect (meaning this test can be used) for the duration of the COVID-19 declaration under Se ction 564(b)(1) of the Act, 21 U.S.C. section 360bbb-3(b)(1), unless the authorization is terminated or revoked sooner.  Performed at [facility], [address], [place], [place] [address]   MRSA PCR Screening     Status: None   Collection Time: 12/31/20 10:49 PM   Specimen: Nasopharyngeal  Result  Value Ref Range Status   MRSA by PCR NEGATIVE NEGATIVE Final    Comment:        The GeneXpert MRSA Assay (FDA approved for NASAL specimens only), is one component of a comprehensive MRSA colonization surveillance program. It is not intended to diagnose MRSA infection nor to guide or monitor treatment for MRSA infections. Performed at [facility], [address] [place], [place] [address]   Culture, blood (x 2)     Status: None (Preliminary result)   Collection Time: 01/01/21  9:31 PM   Specimen: BLOOD  Result Value Ref Range Status   Specimen Description BLOOD BLOOD RIGHT HAND  Final   Special Requests   Final    BOTTLES DRAWN AEROBIC AND ANAEROBIC Blood Culture adequate volume   Culture   Final    NO GROWTH < 12 HOURS Performed at [facility], [address], [place], [place] [address]    Report Status PENDING  Incomplete  Culture, blood (x 2)     Status: None (Preliminary result)   Collection Time: 01/01/21  9:33 PM   Specimen: BLOOD  Result Value Ref Range Status   Specimen Description BLOOD LEFT ANTECUBITAL  Final   Special Requests   Final    BOTTLES DRAWN AEROBIC AND ANAEROBIC Blood Culture adequate volume   Culture   Final    NO GROWTH < 12 HOURS Performed at [facility], [address] [place], [place] [address]    Report Status PENDING  Incomplete         Radiology Studies: Abd 1 View (KUB)  Result Date: 12/31/2020 CLINICAL DATA:  Small-bowel obstruction due to adhesions by report. EXAM: ABDOMEN - 1 VIEW COMPARISON:  December 30, 2018 to FINDINGS: Image limited by mild rotation.  Lung bases grossly clear. Marked distension of the stomach and small bowel loops, pattern seen to varying degrees on multiple prior radiographs grossly unchanged compared to recent radiograph. On limited assessment no acute skeletal process. IMPRESSION: Marked gaseous distension of small bowel compatible with recurrence small-bowel obstruction, seen to  varying degrees on prior imaging and similar as compared to recent imaging from December 30, 2020. Electronically Signed   By: Donzetta Kohut M.D.   On: 12/31/2020 10:19   DG Chest Port 1 View  Result Date: 01/01/2021 CLINICAL DATA:  Tachypnea, history of mental retardation and seizures. EXAM: PORTABLE CHEST 1 VIEW  COMPARISON:  Same day abdominal radiograph and chest radiograph December 10, 2020 FINDINGS: The heart size and mediastinal contours are unchanged. Right basilar airspace opacity. Previously visualized nasogastric tube is no longer present. There is prominent dilation of the gastric body. The visualized skeletal structures are unchanged. IMPRESSION: Right basilar airspace opacity which could represent atelectasis and/or infectious etiology. Interval removal of the nasogastric tube with increased gaseous distension of the stomach, secondary to known small bowel obstruction. Electronically Signed   By: Maudry Mayhew MD   On: 01/01/2021 19:30   DG Abd Portable 1V  Result Date: 01/01/2021 CLINICAL DATA:  Enteric catheter placement EXAM: PORTABLE ABDOMEN - 1 VIEW COMPARISON:  12/31/2020 FINDINGS: Supine frontal view of the left hemiabdomen was obtained. Tip and side port of an enteric catheter project over the gastric body. Nonspecific gaseous distention of the small bowel is again noted, worrisome for obstruction. IMPRESSION: 1. Enteric catheter overlying the gastric body. 2. Small-bowel obstruction unchanged. Electronically Signed   By: Sharlet Salina M.D.   On: 01/01/2021 16:35        Scheduled Meds: . bisacodyl  10 mg Rectal BID  . carbamazepine  200 mg Oral BID  . [START ON 01/06/2021] cloNIDine  0.1 mg Transdermal Q Fri  . lamoTRIgine  150 mg Oral BID  . medroxyPROGESTERone  10 mg Oral Daily  . polyethylene glycol  17 g Oral Daily  . tamsulosin  0.4 mg Oral Daily  . traZODone  300 mg Oral QHS   Continuous Infusions: . dextrose 5 % and 0.45% NaCl 75 mL/hr at 01/02/21 0520  .  levETIRAcetam 500 mg (01/02/21 0927)  . piperacillin-tazobactam (ZOSYN)  IV 3.375 g (01/02/21 0519)     LOS: 2 days    Time spent: 35 minutes, more than 50% of time is spent on direct patient care.    Marrion Coy, MD [facility]   To contact the attending provider between 7A-7P or the covering provider during after hours 7P-7A, please log into the web site [email or web address] and access using universal [place] password for that web site. If you do not have the password, please call the hospital operator.  01/02/2021, 9:34 AM

## 2021-01-02 NOTE — Progress Notes (Signed)
MD aware of ongoing tachypnea rate 40-50, shallow breathing pattern.  Patient does not appear to be in distress.

## 2021-01-03 DIAGNOSIS — G40909 Epilepsy, unspecified, not intractable, without status epilepticus: Secondary | ICD-10-CM | POA: Diagnosis not present

## 2021-01-03 DIAGNOSIS — J9601 Acute respiratory failure with hypoxia: Secondary | ICD-10-CM

## 2021-01-03 DIAGNOSIS — K56609 Unspecified intestinal obstruction, unspecified as to partial versus complete obstruction: Secondary | ICD-10-CM | POA: Diagnosis not present

## 2021-01-03 DIAGNOSIS — J690 Pneumonitis due to inhalation of food and vomit: Secondary | ICD-10-CM | POA: Diagnosis not present

## 2021-01-03 LAB — CBC WITH DIFFERENTIAL/PLATELET
Abs Immature Granulocytes: 0.19 10*3/uL — ABNORMAL HIGH (ref 0.00–0.07)
Basophils Absolute: 0.1 10*3/uL (ref 0.0–0.1)
Basophils Relative: 0 %
Eosinophils Absolute: 0.1 10*3/uL (ref 0.0–0.5)
Eosinophils Relative: 0 %
HCT: 42.6 % (ref 39.0–52.0)
Hemoglobin: 15 g/dL (ref 13.0–17.0)
Immature Granulocytes: 1 %
Lymphocytes Relative: 2 %
Lymphs Abs: 0.5 10*3/uL — ABNORMAL LOW (ref 0.7–4.0)
MCH: 29.6 pg (ref 26.0–34.0)
MCHC: 35.2 g/dL (ref 30.0–36.0)
MCV: 84.2 fL (ref 80.0–100.0)
Monocytes Absolute: 1.4 10*3/uL — ABNORMAL HIGH (ref 0.1–1.0)
Monocytes Relative: 6 %
Neutro Abs: 20.2 10*3/uL — ABNORMAL HIGH (ref 1.7–7.7)
Neutrophils Relative %: 91 %
Platelets: 339 10*3/uL (ref 150–400)
RBC: 5.06 MIL/uL (ref 4.22–5.81)
RDW: 12.6 % (ref 11.5–15.5)
WBC: 22.5 10*3/uL — ABNORMAL HIGH (ref 4.0–10.5)
nRBC: 0 % (ref 0.0–0.2)

## 2021-01-03 LAB — BASIC METABOLIC PANEL
Anion gap: 18 — ABNORMAL HIGH (ref 5–15)
BUN: 28 mg/dL — ABNORMAL HIGH (ref 8–23)
CO2: 25 mmol/L (ref 22–32)
Calcium: 9 mg/dL (ref 8.9–10.3)
Chloride: 96 mmol/L — ABNORMAL LOW (ref 98–111)
Creatinine, Ser: 0.75 mg/dL (ref 0.61–1.24)
GFR, Estimated: >60 mL/min (ref >60)
Glucose, Bld: 232 mg/dL — ABNORMAL HIGH (ref 70–99)
Potassium: 3 mmol/L — ABNORMAL LOW (ref 3.5–5.1)
Sodium: 139 mmol/L (ref 135–145)

## 2021-01-03 LAB — GLUCOSE, CAPILLARY: Glucose-Capillary: 234 mg/dL — ABNORMAL HIGH (ref 70–99)

## 2021-01-03 LAB — MAGNESIUM: Magnesium: 2.7 mg/dL — ABNORMAL HIGH (ref 1.7–2.4)

## 2021-01-03 MED ORDER — GLYCOPYRROLATE 0.2 MG/ML IJ SOLN
0.2000 mg | INTRAMUSCULAR | Status: DC | PRN
Start: 2021-01-03 — End: 2021-01-04
  Filled 2021-01-03: qty 1

## 2021-01-03 MED ORDER — SODIUM CHLORIDE 0.9 % IV SOLN
3.0000 g | Freq: Four times a day (QID) | INTRAVENOUS | Status: DC
  Filled 2021-01-03 (×3): qty 8

## 2021-01-03 MED ORDER — IPRATROPIUM-ALBUTEROL 0.5-2.5 (3) MG/3ML IN SOLN: 3.0000 mL | Freq: Four times a day (QID) | RESPIRATORY_TRACT | Status: DC | PRN

## 2021-01-03 MED ORDER — ALBUTEROL SULFATE (2.5 MG/3ML) 0.083% IN NEBU: 2.5000 mg | INHALATION_SOLUTION | RESPIRATORY_TRACT | Status: DC | PRN

## 2021-01-03 MED ORDER — ORAL CARE MOUTH RINSE: 15.0000 mL | Freq: Two times a day (BID) | OROMUCOSAL | Status: DC

## 2021-01-03 MED ORDER — IPRATROPIUM-ALBUTEROL 0.5-2.5 (3) MG/3ML IN SOLN
3.0000 mL | Freq: Four times a day (QID) | RESPIRATORY_TRACT | Status: DC
  Administered 2021-01-03: 3 mL via RESPIRATORY_TRACT
  Filled 2021-01-03: qty 3

## 2021-01-03 MED ORDER — CHLORHEXIDINE GLUCONATE 0.12 % MT SOLN
15.0000 mL | Freq: Two times a day (BID) | OROMUCOSAL | Status: DC
  Administered 2021-01-03: 15 mL via OROMUCOSAL

## 2021-01-03 MED ORDER — FUROSEMIDE 10 MG/ML IJ SOLN
INTRAMUSCULAR | Status: AC
  Filled 2021-01-03: qty 4

## 2021-01-03 MED ORDER — MORPHINE SULFATE (CONCENTRATE) 10 MG/0.5ML PO SOLN
5.0000 mg | ORAL | Status: DC | PRN
  Administered 2021-01-03: 5 mg via ORAL

## 2021-01-03 MED ORDER — GLYCOPYRROLATE 1 MG PO TABS
1.0000 mg | ORAL_TABLET | ORAL | Status: DC | PRN
  Filled 2021-01-03: qty 1

## 2021-01-03 MED ORDER — FUROSEMIDE 10 MG/ML IJ SOLN
INTRAMUSCULAR | Status: AC
  Administered 2021-01-03: 40 mg via INTRAVENOUS
  Filled 2021-01-03: qty 4

## 2021-01-03 MED ORDER — FUROSEMIDE 10 MG/ML IJ SOLN: 40.0000 mg | INTRAMUSCULAR | Status: AC

## 2021-01-03 MED ORDER — MORPHINE SULFATE (CONCENTRATE) 10 MG/0.5ML PO SOLN
5.0000 mg | ORAL | Status: DC | PRN
  Filled 2021-01-03: qty 0.5

## 2021-01-03 MED ORDER — METOCLOPRAMIDE HCL 5 MG/ML IJ SOLN
5.0000 mg | Freq: Once | INTRAMUSCULAR | Status: AC
  Administered 2021-01-03: 5 mg via INTRAVENOUS
  Filled 2021-01-03: qty 2

## 2021-01-03 NOTE — Care Management Important Message (Signed)
Important Message  Patient Details  Name: Richard Gillespie MRN: [id number] Date of Birth: 08-31-1951   Medicare Important Message Given:  Other (see comment)  Patient on comfort care measures.  Medicare IM withheld at this time out of respect for patient and family.    Johnell Comings 01/03/2021, 1:11 PM

## 2021-01-03 NOTE — Progress Notes (Addendum)
Rapid response called on patient at this time due to deterioration in vital signs and increased oxygen demand.  Blood sugar 234

## 2021-01-03 NOTE — Significant Event (Signed)
Rapid Response Event Note   Reason for Call :  Oxygen desaturation, potential aspiration, red mews  Initial Focused Assessment:  Rapid response RN arrived in patient's room with 2C NT and Dr. Chipper Herb at bedside. Patient with labored breathing and with non-rebreather mask in place. Per Dr. Chipper Herb, patient vomited and likely aspirated. The primary nurse of the patient was getting lasix he ordered to administer and he ordered this RN to stop the patient's fluids. He stepped out to call and update the family. Per patient's RN who returned to administer lasix IV, patient oxygen saturations had dropped and been unable to be sustained on 6L and so non rebreather mask at 15L was placed. Patient was alert and able to talk some with staff, initally just repeated that he wanted to go home ("want go home", "home", "want home"). Staff gave verbal consolation. Vital Signs: BP 176/99 MAP 118, HR 132 ST, oxygen saturations 96% on NRB 15L, RR 40 and labored.  Interventions:  2C RN administered lasix, fluids stopped. Patient pulled off NRB mask, so placed back on nasal cannula at 6L while MD spoke with family. Oxygen saturations anywhere from 85-90% on 6L.   Plan of Care:  This RN had stepped out to get a non heated high flow nasal cannula to try with patient, when Dr. Chipper Herb finished conversation and comfort care orders placed.  Event Summary:   MD Notified: Dr. Chipper Herb Call Time: 10:04 Arrival Time: 10:06 End Time: 10:29  Bennie Dallas, RN

## 2021-01-03 NOTE — Progress Notes (Signed)
Patient vomited numerous times during the night despite phenergan, zofran and reglan. Congested sounding this am. Dr Para March was made aware and per previous MD notes NG tube was to be put on hold.

## 2021-01-03 NOTE — Progress Notes (Signed)
  Chaplain On-Call responded to Rapid Response notification by pager.  Medical Team is attending to patient at bedside.  Chaplain provided pastoral presence and silent prayer.  Chaplains are available for support as needed.  Chaplain Tanika Bracco B. Monee Dembeck M.Div., [facility]

## 2021-01-03 NOTE — Progress Notes (Signed)
[facility] Room 221 [facility] Hospital Liaison RN note:   Received request from Dr. Chipper Herb and Bevelyn Ngo, [facility] for family interest in [facility]. Chart reviewed and eligibility was approved. Spoke with patient's brother, Sherrine Maples to confirm interest and explain services. He had no questions. Sherrine Maples will docusign registration paperwork when a bed becomes available.  Unfortunately, [facility] does not have a room to offer today. Hospital care team is aware. [facility] Liaison will continue to follow for room availability.   Please call for any hospice related questions or concerns.   Thank you for the opportunity to participate in this patient's care.  Cyndra Numbers, RN [facility] Liaison [phone]

## 2021-01-03 NOTE — Progress Notes (Signed)
PROGRESS NOTE    Richard Gillespie  PIR:[id number] DOB: Nov 17, 1951 DOA: Jan 14, 2021 PCP: Gracelyn Nurse, MD   Chief complaint.  Nausea vomiting and abdominal pain Brief Narrative:  Richard Gillespie a 70 y.o.malewith medical history significant ofhypertension, seizures, severe intellectual disability, history of small bowel obstructionsstatus post laparotomy and lysis of adhesion. Hepresents to the emergency department because of vomiting.Abdominal film showed dilated loops of small bowel and distention of the stomach. He is diagnosed with small bowel obstruction versus ileus. Was placed on fluids and n.p.o. Patient condition was not improving, he also developed aspiration pneumonia.  Discussed with the patient's guardian, decision was made to start comfort care.   Assessment & Plan:   Active Problems:   Leukocytosis   SBO (small bowel obstruction) (HCC)   Aspiration pneumonia of both lower lobes due to gastric secretions (HCC)   Seizure disorder (HCC)   Intellectual disability   Small bowel obstruction due to adhesions (HCC)   Acute hypoxemic respiratory failure (HCC) Patient condition is not improving, he continues to have nausea vomiting.  Oxygenation is getting worse. Had a long discussion with patient regarding, brother, again today, decision was made to start comfort care.  Will monitor patient in hospital for 1 to 2 days, depends on stability, patient can be going to inpatient hospice versus die in the hospital.  Discontinued all active treatment.  Start comfort care measures.      Subjective: Patient condition worsened today, patient developed significant hypoxia and shortness of breath.  He continued vomiting.  He did not have additional bowel movement since yesterday.  Abdominal distention is worse.  Objective: Vitals:   01/03/21 1000 01/03/21 1003 01/03/21 1006 01/03/21 1015  BP:  (!) 176/99    Pulse:      Resp:      Temp:      TempSrc:      SpO2: (!) 80% (!)  87% 97% (!) 89%  Weight:      Height:        Intake/Output Summary (Last 24 hours) at 01/03/2021 1028 Last data filed at 01/03/2021 0930 Gross per 24 hour  Intake 2791.34 ml  Output 200 ml  Net 2591.34 ml   Filed Weights   01/14/2021 1650  Weight: 59 kg    Examination:  General exam: acutely ill, agitated Respiratory system: Rhonchi in the base, tachypnea. Cardiovascular system: Tachycardia, no murmurs Gastrointestinal system: Abdomen is distended, tender.  Decreased bowel sounds. Central nervous system: Confused and agitated. Extremities: Symmetric  Skin: No rashes, lesions or ulcers     Data Reviewed: I have personally reviewed following labs and imaging studies  CBC: Recent Labs  Lab 12/31/20 1227 01/01/21 0521 01/01/21 2131 01/02/21 0415 01/03/21 0807  WBC 14.6* 15.0* 3.8* 13.1* 22.5*  NEUTROABS 12.3*  --  3.3 11.5* 20.2*  HGB 15.0 15.6 14.7 14.3 15.0  HCT 45.0 47.1 43.0 41.3 42.6  MCV 88.2 88.5 86.5 87.3 84.2  PLT 353 308 328 282 339   Basic Metabolic Panel: Recent Labs  Lab January 14, 2021 1655 01/01/21 1051 01/01/21 2131 01/02/21 0745 01/03/21 0807  NA 138 140 139 143 139  K 3.9 3.5 2.9* 3.2* 3.0*  CL 102 95* 100 101 96*  CO2 22 27 25 29 25   GLUCOSE 145* 143* 184* 177* 232*  BUN 17 54* 59* 43* 28*  CREATININE 0.98 1.45* 1.28* 1.03 0.75  CALCIUM 9.3 9.3 8.3* 8.2* 9.0  MG  --  2.5*  --   --  2.7*  PHOS  --  5.4*  --   --   --    GFR: Estimated Creatinine Clearance: 72.7 mL/min (by C-G formula based on SCr of 0.75 mg/dL). Liver Function Tests: Recent Labs  Lab 2021-01-24 1655 01/01/21 2131  AST 29 67*  ALT 23 53*  ALKPHOS 53 49  BILITOT 0.9 1.3*  PROT 8.2* 7.1  ALBUMIN 4.8 4.0   Recent Labs  Lab 2021-01-24 1655  LIPASE 25   No results for input(s): AMMONIA in the last 168 hours. Coagulation Profile: Recent Labs  Lab 01/01/21 2131  INR 1.2   Cardiac Enzymes: No results for input(s): CKTOTAL, CKMB, CKMBINDEX, TROPONINI in the last 168  hours. BNP (last 3 results) No results for input(s): PROBNP in the last 8760 hours. HbA1C: No results for input(s): HGBA1C in the last 72 hours. CBG: Recent Labs  Lab 01/01/21 1829 01/01/21 1938 01/03/21 1005  GLUCAP 161* 152* 234*   Lipid Profile: No results for input(s): CHOL, HDL, LDLCALC, TRIG, CHOLHDL, LDLDIRECT in the last 72 hours. Thyroid Function Tests: No results for input(s): TSH, T4TOTAL, FREET4, T3FREE, THYROIDAB in the last 72 hours. Anemia Panel: No results for input(s): VITAMINB12, FOLATE, FERRITIN, TIBC, IRON, RETICCTPCT in the last 72 hours. Sepsis Labs: Recent Labs  Lab 01/01/21 2133 01/02/21 0056  LATICACIDVEN 1.6 1.5    Recent Results (from the past 240 hour(s))  SARS CORONAVIRUS 2 (TAT 6-24 HRS) Nasopharyngeal Nasopharyngeal Swab     Status: None   Collection Time: 2021-01-24 11:00 PM   Specimen: Nasopharyngeal Swab  Result Value Ref Range Status   SARS Coronavirus 2 NEGATIVE NEGATIVE Final    Comment: (NOTE) SARS-CoV-2 target nucleic acids are NOT DETECTED.  The SARS-CoV-2 RNA is generally detectable in upper and lower respiratory specimens during the acute phase of infection. Negative results do not preclude SARS-CoV-2 infection, do not rule out co-infections with other pathogens, and should not be used as the sole basis for treatment or other patient management decisions. Negative results must be combined with clinical observations, patient history, and epidemiological information. The expected result is Negative.  Fact Sheet for Patients: HairSlick.no  Fact Sheet for Healthcare Providers: quierodirigir.com  This test is not yet approved or cleared by the [place] FDA and  has been authorized for detection and/or diagnosis of SARS-CoV-2 by FDA under an Emergency Use Authorization (EUA). This EUA will remain  in effect (meaning this test can be used) for the duration of the COVID-19  declaration under Se ction 564(b)(1) of the Act, 21 U.S.C. section 360bbb-3(b)(1), unless the authorization is terminated or revoked sooner.  Performed at [facility], [address], [place], [place] [address]   MRSA PCR Screening     Status: None   Collection Time: 12/31/20 10:49 PM   Specimen: Nasopharyngeal  Result Value Ref Range Status   MRSA by PCR NEGATIVE NEGATIVE Final    Comment:        The GeneXpert MRSA Assay (FDA approved for NASAL specimens only), is one component of a comprehensive MRSA colonization surveillance program. It is not intended to diagnose MRSA infection nor to guide or monitor treatment for MRSA infections. Performed at [facility], [address] [place], [place] [address]   Culture, blood (x 2)     Status: None (Preliminary result)   Collection Time: 01/01/21  9:31 PM   Specimen: BLOOD  Result Value Ref Range Status   Specimen Description BLOOD BLOOD RIGHT HAND  Final   Special Requests  Final    BOTTLES DRAWN AEROBIC AND ANAEROBIC Blood Culture adequate volume   Culture   Final    NO GROWTH 2 DAYS Performed at [facility], [address] [place], [place] [address]    Report Status PENDING  Incomplete  Culture, blood (x 2)     Status: None (Preliminary result)   Collection Time: 01/01/21  9:33 PM   Specimen: BLOOD  Result Value Ref Range Status   Specimen Description BLOOD LEFT ANTECUBITAL  Final   Special Requests   Final    BOTTLES DRAWN AEROBIC AND ANAEROBIC Blood Culture adequate volume   Culture   Final    NO GROWTH 2 DAYS Performed at [facility], [address] [place], [place] [address]    Report Status PENDING  Incomplete         Radiology Studies: DG Chest Port 1 View  Result Date: 01/01/2021 CLINICAL DATA:  Tachypnea, history of mental retardation and seizures. EXAM: PORTABLE CHEST 1 VIEW COMPARISON:  Same day abdominal radiograph and chest radiograph December 10, 2020  FINDINGS: The heart size and mediastinal contours are unchanged. Right basilar airspace opacity. Previously visualized nasogastric tube is no longer present. There is prominent dilation of the gastric body. The visualized skeletal structures are unchanged. IMPRESSION: Right basilar airspace opacity which could represent atelectasis and/or infectious etiology. Interval removal of the nasogastric tube with increased gaseous distension of the stomach, secondary to known small bowel obstruction. Electronically Signed   By: Maudry Mayhew MD   On: 01/01/2021 19:30   DG Abd Portable 1V  Result Date: 01/01/2021 CLINICAL DATA:  Enteric catheter placement EXAM: PORTABLE ABDOMEN - 1 VIEW COMPARISON:  12/31/2020 FINDINGS: Supine frontal view of the left hemiabdomen was obtained. Tip and side port of an enteric catheter project over the gastric body. Nonspecific gaseous distention of the small bowel is again noted, worrisome for obstruction. IMPRESSION: 1. Enteric catheter overlying the gastric body. 2. Small-bowel obstruction unchanged. Electronically Signed   By: Sharlet Salina M.D.   On: 01/01/2021 16:35        Scheduled Meds: . furosemide      . ipratropium-albuterol  3 mL Nebulization Q6H   Continuous Infusions:   LOS: 3 days    Time spent: 36 minutes    Marrion Coy, MD [facility]   To contact the attending provider between 7A-7P or the covering provider during after hours 7P-7A, please log into the web site [email or web address] and access using universal [place] password for that web site. If you do not have the password, please call the hospital operator.  01/03/2021, 10:28 AM

## 2021-01-03 NOTE — TOC Progression Note (Signed)
Transition of Care [facility]) - Progression Note    Patient Details  Name: Richard Gillespie MRN: [id number] Date of Birth: [birth year]  [facility]) CM/SW Contact  Chapman Fitch, RN Phone Number: 01/03/2021, 11:56 AM  Clinical Narrative:      Patient transitioned to full comfort care MD has discussed disposition with patient's brother who is legal guardian and would like to proceed with [facility] home Referral.  Referral made to [facility] with Hospice.   Brother requested that I updated group home.  Lupita Leash with group home notified Per MD potential hospital death  Expected Discharge Plan: Group Home Barriers to Discharge: No Barriers Identified  Expected Discharge Plan and Services Expected Discharge Plan: Group Home       Living arrangements for the past 2 months: Group Home                                       Social Determinants of Health (SDOH) Interventions    Readmission Risk Interventions Readmission Risk Prevention Plan 01/02/2021  Transportation Screening Complete  Palliative Care Screening Complete  Medication Review (RN Care Manager) Complete  Some recent data might be hidden

## 2021-01-06 LAB — CULTURE, BLOOD (ROUTINE X 2)
Culture: NO GROWTH
Culture: NO GROWTH
Special Requests: ADEQUATE
Special Requests: ADEQUATE

## 2021-01-24 NOTE — Progress Notes (Signed)
Patient found unresponsive with no respirations or pulse at 0641. Patient's guardian-brother Denton Ar notified. He will come to visit patient shortly.

## 2021-01-24 NOTE — TOC Transition Note (Signed)
Transition of Care [facility]) - CM/SW Discharge Note   Patient Details  Name: Richard Gillespie MRN: [id number] Date of Birth: [birth year]  Transition [facility]) CM/SW Contact:  Chapman Fitch, RN Phone Number: 01/07/2021, 9:07 AM   Clinical Narrative:     Patient expired.  Notified Ulyess Blossom at [facility]     Barriers to Discharge: No Barriers Identified   Patient Goals and CMS Choice        Discharge Placement                       Discharge Plan and Services                                     Social Determinants of Health (SDOH) Interventions     Readmission Risk Interventions Readmission Risk Prevention Plan 01/02/2021  Transportation Screening Complete  Palliative Care Screening Complete  Medication Review (RN Care Manager) Complete  Some recent data might be hidden

## 2021-01-24 NOTE — Progress Notes (Signed)
Brother, [facility] cell phone number [phone]

## 2021-01-24 NOTE — Progress Notes (Signed)
Cross Cover Patient under comfort care found unresponsive without spontaneous respirations or pulse by nurse.  Death confirmed at bedside by exam. Time of death 25 on 04-Mar-20212. Death certificate and summary to be completed by attending

## 2021-01-24 NOTE — Death Summary Note (Signed)
Small DEATH SUMMARY   Patient Details  Name: Richard Gillespie MRN: [id number] DOB: Oct 31, 1951  Admission/Discharge Information   Admit Date:  01-28-21  Date of Death: Date of Death: 02-Feb-2021  Time of Death: Time of Death: 0641  Length of Stay: 4  Referring Physician: Gracelyn Nurse, MD   Reason(s) for Hospitalization  Small bowel obstruction  Diagnoses  Preliminary cause of death:  Secondary Diagnoses (including complications and co-morbidities):  Active Problems:   Leukocytosis   SBO (small bowel obstruction) (HCC)   Aspiration pneumonia of both lower lobes due to gastric secretions (HCC)   Seizure disorder (HCC)   Intellectual disability   Small bowel obstruction due to adhesions [facility])   Acute hypoxemic respiratory failure [facility])   Brief Hospital Course (including significant findings, care, treatment, and services provided and events leading to death)  Richard Gillespie a 70 y.o.malewith medical history significant ofhypertension, seizures, severe intellectual disability, history of small bowel obstructionsstatus post laparotomy and lysis of adhesion. Hepresents to the emergency department because of vomiting.Abdominal film showed dilated loops of small bowel and distention of the stomach. He is diagnosed with small bowel obstruction versus ileus. Was placed on fluids and n.p.o. Patient condition was not improving, he also developed aspiration pneumonia.  Discussed with the patient's guardian, decision was made to start comfort care.      Pertinent Labs and Studies  Significant Diagnostic Studies Abd 1 View (KUB)  Result Date: 12/31/2020 CLINICAL DATA:  Small-bowel obstruction due to adhesions by report. EXAM: ABDOMEN - 1 VIEW COMPARISON:  2019-01-28 to FINDINGS: Image limited by mild rotation.  Lung bases grossly clear. Marked distension of the stomach and small bowel loops, pattern seen to varying degrees on multiple prior radiographs grossly unchanged  compared to recent radiograph. On limited assessment no acute skeletal process. IMPRESSION: Marked gaseous distension of small bowel compatible with recurrence small-bowel obstruction, seen to varying degrees on prior imaging and similar as compared to recent imaging from 01-28-21. Electronically Signed   By: Donzetta Kohut M.D.   On: 12/31/2020 10:19   DG Abdomen 1 View  Result Date: 12/10/2020 CLINICAL DATA:  Clinical signs of small-bowel obstruction. Poor p.o. intake. Diarrhea. EXAM: ABDOMEN - 1 VIEW COMPARISON:  08/05/2019. FINDINGS: There is dilated small bowel with significant gaseous distention of the stomach. No convincing colonic dilation. Soft tissues are poorly defined due to overlying bowel gas. No convincing renal or ureteral stone. Skeletal structures show no acute findings. IMPRESSION: 1. Significant dilation of small bowel consistent with a small-bowel obstruction. Findings are similar to the prior radiographs. Electronically Signed   By: Amie Portland M.D.   On: 12/10/2020 16:49   DG Chest Port 1 View  Result Date: 01/01/2021 CLINICAL DATA:  Tachypnea, history of mental retardation and seizures. EXAM: PORTABLE CHEST 1 VIEW COMPARISON:  Same day abdominal radiograph and chest radiograph December 10, 2020 FINDINGS: The heart size and mediastinal contours are unchanged. Right basilar airspace opacity. Previously visualized nasogastric tube is no longer present. There is prominent dilation of the gastric body. The visualized skeletal structures are unchanged. IMPRESSION: Right basilar airspace opacity which could represent atelectasis and/or infectious etiology. Interval removal of the nasogastric tube with increased gaseous distension of the stomach, secondary to known small bowel obstruction. Electronically Signed   By: Maudry Mayhew MD   On: 01/01/2021 19:30   DG Chest Portable 1 View  Result Date: 12/10/2020 CLINICAL DATA:  Covid exposure, weakness. Care taker at bedside states  that patient hasn't eaten in 2 days and has had diarrhea x 2 but this am BM was normal. EXAM: PORTABLE CHEST 1 VIEW COMPARISON:  08/09/2019. FINDINGS: Cardiac silhouette is normal in size, but shifted to the right, similar to prior studies. No mediastinal or hilar masses. Elevated left hemidiaphragm.  Stomach moderately distended with air. Lungs are clear.  No pleural effusion and no pneumothorax. Skeletal structures are grossly intact. IMPRESSION: No acute cardiopulmonary disease. Electronically Signed   By: Amie Portland M.D.   On: 12/10/2020 15:34   DG Abdomen Acute W/Chest  Result Date: 01/17/2021 CLINICAL DATA:  Vomiting.  Previous bowel obstruction. EXAM: DG ABDOMEN ACUTE WITH 1 VIEW CHEST COMPARISON:  12/11/2020 FINDINGS: Recurrent small bowel obstruction pattern with markedly dilated fluid and air-filled small intestine. No sign of free air. The stomach is dilated as well. No colonic gas. No acute or significant bone finding. IMPRESSION: Recurrent small bowel obstruction pattern with markedly dilated fluid and air-filled small intestine. No free air. Electronically Signed   By: Paulina Fusi M.D.   On: 01/20/2021 21:08   DG Abd Portable 1V  Result Date: 01/01/2021 CLINICAL DATA:  Enteric catheter placement EXAM: PORTABLE ABDOMEN - 1 VIEW COMPARISON:  12/31/2020 FINDINGS: Supine frontal view of the left hemiabdomen was obtained. Tip and side port of an enteric catheter project over the gastric body. Nonspecific gaseous distention of the small bowel is again noted, worrisome for obstruction. IMPRESSION: 1. Enteric catheter overlying the gastric body. 2. Small-bowel obstruction unchanged. Electronically Signed   By: Sharlet Salina M.D.   On: 01/01/2021 16:35   DG Abd Portable 1V  Result Date: 12/11/2020 CLINICAL DATA:  Possible small bowel obstruction. EXAM: PORTABLE ABDOMEN - 1 VIEW COMPARISON:  12/10/2020 and CT 12/07/2019 FINDINGS: Bowel gas pattern demonstrates air throughout the colon. Again noted  is a single dilated small bowel loop over the left mid to lower abdomen measuring 5.2 cm in diameter. No definite free peritoneal air. Remainder of the exam is unchanged. IMPRESSION: Persistent single dilated small bowel loop over the left mid to lower abdomen which may be due to early/partial small bowel obstruction. Electronically Signed   By: Elberta Fortis M.D.   On: 12/11/2020 14:31   [place] EKG SITE RITE  Result Date: 12/11/2020 If Site Rite image not attached, placement could not be confirmed due to current cardiac rhythm.   Microbiology Recent Results (from the past 240 hour(s))  SARS CORONAVIRUS 2 (TAT 6-24 HRS) Nasopharyngeal Nasopharyngeal Swab     Status: None   Collection Time: 01/19/2021 11:00 PM   Specimen: Nasopharyngeal Swab  Result Value Ref Range Status   SARS Coronavirus 2 NEGATIVE NEGATIVE Final    Comment: (NOTE) SARS-CoV-2 target nucleic acids are NOT DETECTED.  The SARS-CoV-2 RNA is generally detectable in upper and lower respiratory specimens during the acute phase of infection. Negative results do not preclude SARS-CoV-2 infection, do not rule out co-infections with other pathogens, and should not be used as the sole basis for treatment or other patient management decisions. Negative results must be combined with clinical observations, patient history, and epidemiological information. The expected result is Negative.  Fact Sheet for Patients: HairSlick.no  Fact Sheet for Healthcare Providers: quierodirigir.com  This test is not yet approved or cleared by the [place] FDA and  has been authorized for detection and/or diagnosis of SARS-CoV-2 by FDA under an Emergency Use Authorization (EUA). This EUA will remain  in effect (meaning this test can be used) for the duration  of the COVID-19 declaration under Se ction 564(b)(1) of the Act, 21 U.S.C. section 360bbb-3(b)(1), unless the authorization is terminated  or revoked sooner.  Performed at [facility], [address] [place], [place] [address]   MRSA PCR Screening     Status: None   Collection Time: 12/31/20 10:49 PM   Specimen: Nasopharyngeal  Result Value Ref Range Status   MRSA by PCR NEGATIVE NEGATIVE Final    Comment:        The GeneXpert MRSA Assay (FDA approved for NASAL specimens only), is one component of a comprehensive MRSA colonization surveillance program. It is not intended to diagnose MRSA infection nor to guide or monitor treatment for MRSA infections. Performed at [facility], [address] [place], [place] [address]   Culture, blood (x 2)     Status: None (Preliminary result)   Collection Time: 01/01/21  9:31 PM   Specimen: BLOOD  Result Value Ref Range Status   Specimen Description BLOOD BLOOD RIGHT HAND  Final   Special Requests   Final    BOTTLES DRAWN AEROBIC AND ANAEROBIC Blood Culture adequate volume   Culture   Final    NO GROWTH 2 DAYS Performed at [facility], [address] [place], [place] [address]    Report Status PENDING  Incomplete  Culture, blood (x 2)     Status: None (Preliminary result)   Collection Time: 01/01/21  9:33 PM   Specimen: BLOOD  Result Value Ref Range Status   Specimen Description BLOOD LEFT ANTECUBITAL  Final   Special Requests   Final    BOTTLES DRAWN AEROBIC AND ANAEROBIC Blood Culture adequate volume   Culture   Final    NO GROWTH 2 DAYS Performed at [facility], [address] [place], [place] [address]    Report Status PENDING  Incomplete    Lab Basic Metabolic Panel: Recent Labs  Lab 01/22/21 1655 01/01/21 1051 01/01/21 2131 01/02/21 0745 01/03/21 0807  NA 138 140 139 143 139  K 3.9 3.5 2.9* 3.2* 3.0*  CL 102 95* 100 101 96*  CO2 22 27 25 29 25   GLUCOSE 145* 143* 184* 177* 232*  BUN 17 54* 59* 43* 28*  CREATININE 0.98 1.45* 1.28* 1.03 0.75  CALCIUM 9.3 9.3 8.3* 8.2* 9.0  MG  --  2.5*  --   --  2.7*   PHOS  --  5.4*  --   --   --    Liver Function Tests: Recent Labs  Lab January 22, 2021 1655 01/01/21 2131  AST 29 67*  ALT 23 53*  ALKPHOS 53 49  BILITOT 0.9 1.3*  PROT 8.2* 7.1  ALBUMIN 4.8 4.0   Recent Labs  Lab Jan 22, 2021 1655  LIPASE 25   No results for input(s): AMMONIA in the last 168 hours. CBC: Recent Labs  Lab 12/31/20 1227 01/01/21 0521 01/01/21 2131 01/02/21 0415 01/03/21 0807  WBC 14.6* 15.0* 3.8* 13.1* 22.5*  NEUTROABS 12.3*  --  3.3 11.5* 20.2*  HGB 15.0 15.6 14.7 14.3 15.0  HCT 45.0 47.1 43.0 41.3 42.6  MCV 88.2 88.5 86.5 87.3 84.2  PLT 353 308 328 282 339   Cardiac Enzymes: No results for input(s): CKTOTAL, CKMB, CKMBINDEX, TROPONINI in the last 168 hours. Sepsis Labs: Recent Labs  Lab 01/01/21 0521 01/01/21 2131 01/01/21 2133 01/02/21 0056 01/02/21 0415 01/03/21 0807  WBC 15.0* 3.8*  --   --  13.1* 22.5*  LATICACIDVEN  --   --  1.6 1.5  --   --     Procedures/Operations  None   Edilia Ghuman 01/15/2021, 7:27 AM

## 2021-01-24 DEATH — deceased
